# Patient Record
Sex: Male | Born: 1955 | Race: White | Hispanic: No | Smoking: Current every day smoker
Health system: Southern US, Community
[De-identification: ages and names within clinical notes are randomized; demographics above are authoritative.]

## PROBLEM LIST (undated history)

## (undated) DIAGNOSIS — C801 Malignant (primary) neoplasm, unspecified: Secondary | ICD-10-CM

## (undated) DIAGNOSIS — G8929 Other chronic pain: Secondary | ICD-10-CM

## (undated) DIAGNOSIS — F32A Depression, unspecified: Secondary | ICD-10-CM

## (undated) DIAGNOSIS — J449 Chronic obstructive pulmonary disease, unspecified: Secondary | ICD-10-CM

## (undated) DIAGNOSIS — F329 Major depressive disorder, single episode, unspecified: Secondary | ICD-10-CM

## (undated) DIAGNOSIS — R51 Headache: Secondary | ICD-10-CM

## (undated) DIAGNOSIS — M1990 Unspecified osteoarthritis, unspecified site: Secondary | ICD-10-CM

## (undated) DIAGNOSIS — I10 Essential (primary) hypertension: Secondary | ICD-10-CM

## (undated) DIAGNOSIS — C4490 Unspecified malignant neoplasm of skin, unspecified: Secondary | ICD-10-CM

## (undated) HISTORY — PX: SKIN CANCER EXCISION: SHX779

## (undated) HISTORY — PX: OTHER SURGICAL HISTORY: SHX169

## (undated) HISTORY — PX: KNEE ARTHROSCOPY: SUR90

---

## 2004-08-29 ENCOUNTER — Ambulatory Visit: Payer: Self-pay | Admitting: Internal Medicine

## 2005-06-10 ENCOUNTER — Emergency Department: Payer: Self-pay | Admitting: Emergency Medicine

## 2006-11-16 ENCOUNTER — Emergency Department (HOSPITAL_COMMUNITY): Admission: EM | Admit: 2006-11-16 | Discharge: 2006-11-16 | Payer: Self-pay | Admitting: Emergency Medicine

## 2007-05-03 ENCOUNTER — Emergency Department (HOSPITAL_COMMUNITY): Admission: EM | Admit: 2007-05-03 | Discharge: 2007-05-03 | Payer: Self-pay | Admitting: Emergency Medicine

## 2007-05-04 ENCOUNTER — Emergency Department (HOSPITAL_COMMUNITY): Admission: EM | Admit: 2007-05-04 | Discharge: 2007-05-04 | Payer: Self-pay | Admitting: Emergency Medicine

## 2007-05-18 ENCOUNTER — Other Ambulatory Visit: Payer: Self-pay

## 2007-05-18 ENCOUNTER — Emergency Department: Payer: Self-pay | Admitting: Emergency Medicine

## 2007-12-12 ENCOUNTER — Emergency Department (HOSPITAL_COMMUNITY): Admission: EM | Admit: 2007-12-12 | Discharge: 2007-12-12 | Payer: Self-pay | Admitting: Emergency Medicine

## 2008-02-29 ENCOUNTER — Emergency Department (HOSPITAL_COMMUNITY): Admission: EM | Admit: 2008-02-29 | Discharge: 2008-02-29 | Payer: Self-pay | Admitting: Emergency Medicine

## 2008-10-20 ENCOUNTER — Ambulatory Visit: Payer: Self-pay | Admitting: Internal Medicine

## 2008-11-10 ENCOUNTER — Ambulatory Visit: Payer: Self-pay | Admitting: Internal Medicine

## 2008-11-11 ENCOUNTER — Ambulatory Visit: Payer: Self-pay | Admitting: Internal Medicine

## 2008-11-14 ENCOUNTER — Ambulatory Visit: Payer: Self-pay | Admitting: Internal Medicine

## 2008-11-16 ENCOUNTER — Ambulatory Visit: Payer: Self-pay | Admitting: Internal Medicine

## 2008-11-23 ENCOUNTER — Ambulatory Visit: Payer: Self-pay | Admitting: Internal Medicine

## 2008-11-24 ENCOUNTER — Ambulatory Visit: Payer: Self-pay | Admitting: Family Medicine

## 2008-11-25 ENCOUNTER — Emergency Department (HOSPITAL_COMMUNITY): Admission: EM | Admit: 2008-11-25 | Discharge: 2008-11-25 | Payer: Self-pay | Admitting: Emergency Medicine

## 2008-11-28 ENCOUNTER — Ambulatory Visit: Payer: Self-pay | Admitting: Internal Medicine

## 2008-11-29 ENCOUNTER — Ambulatory Visit: Payer: Self-pay | Admitting: Internal Medicine

## 2008-12-01 ENCOUNTER — Ambulatory Visit: Payer: Self-pay | Admitting: Family Medicine

## 2008-12-04 ENCOUNTER — Emergency Department (HOSPITAL_COMMUNITY): Admission: EM | Admit: 2008-12-04 | Discharge: 2008-12-04 | Payer: Self-pay | Admitting: Emergency Medicine

## 2008-12-06 ENCOUNTER — Ambulatory Visit: Payer: Self-pay | Admitting: Internal Medicine

## 2008-12-08 ENCOUNTER — Ambulatory Visit: Payer: Self-pay | Admitting: Internal Medicine

## 2008-12-12 ENCOUNTER — Ambulatory Visit: Payer: Self-pay | Admitting: Internal Medicine

## 2008-12-12 ENCOUNTER — Ambulatory Visit (HOSPITAL_COMMUNITY): Admission: RE | Admit: 2008-12-12 | Discharge: 2008-12-12 | Payer: Self-pay | Admitting: Internal Medicine

## 2008-12-14 ENCOUNTER — Ambulatory Visit: Payer: Self-pay | Admitting: Internal Medicine

## 2008-12-15 ENCOUNTER — Ambulatory Visit: Payer: Self-pay | Admitting: Internal Medicine

## 2008-12-17 ENCOUNTER — Emergency Department (HOSPITAL_COMMUNITY): Admission: EM | Admit: 2008-12-17 | Discharge: 2008-12-17 | Payer: Self-pay | Admitting: Emergency Medicine

## 2008-12-22 ENCOUNTER — Ambulatory Visit: Payer: Self-pay | Admitting: Family Medicine

## 2008-12-28 ENCOUNTER — Ambulatory Visit: Payer: Self-pay | Admitting: Internal Medicine

## 2008-12-31 ENCOUNTER — Emergency Department (HOSPITAL_COMMUNITY): Admission: EM | Admit: 2008-12-31 | Discharge: 2008-12-31 | Payer: Self-pay | Admitting: Emergency Medicine

## 2009-01-12 ENCOUNTER — Ambulatory Visit (HOSPITAL_BASED_OUTPATIENT_CLINIC_OR_DEPARTMENT_OTHER): Admission: RE | Admit: 2009-01-12 | Discharge: 2009-01-13 | Payer: Self-pay | Admitting: Orthopedic Surgery

## 2009-01-17 ENCOUNTER — Ambulatory Visit: Payer: Self-pay | Admitting: Family Medicine

## 2009-01-23 ENCOUNTER — Ambulatory Visit: Payer: Self-pay | Admitting: Internal Medicine

## 2009-01-23 ENCOUNTER — Ambulatory Visit (HOSPITAL_COMMUNITY): Admission: RE | Admit: 2009-01-23 | Discharge: 2009-01-23 | Payer: Self-pay | Admitting: Internal Medicine

## 2009-01-25 ENCOUNTER — Emergency Department (HOSPITAL_COMMUNITY): Admission: EM | Admit: 2009-01-25 | Discharge: 2009-01-25 | Payer: Self-pay | Admitting: Emergency Medicine

## 2009-01-31 ENCOUNTER — Ambulatory Visit: Payer: Self-pay | Admitting: Internal Medicine

## 2009-02-02 ENCOUNTER — Ambulatory Visit: Payer: Self-pay | Admitting: Internal Medicine

## 2009-02-07 ENCOUNTER — Ambulatory Visit (HOSPITAL_COMMUNITY): Admission: RE | Admit: 2009-02-07 | Discharge: 2009-02-07 | Payer: Self-pay | Admitting: Internal Medicine

## 2009-02-07 ENCOUNTER — Ambulatory Visit: Payer: Self-pay | Admitting: Internal Medicine

## 2009-02-16 ENCOUNTER — Ambulatory Visit: Payer: Self-pay | Admitting: Family Medicine

## 2009-02-23 ENCOUNTER — Ambulatory Visit: Payer: Self-pay | Admitting: Family Medicine

## 2009-03-13 ENCOUNTER — Ambulatory Visit: Payer: Self-pay | Admitting: Family Medicine

## 2009-03-27 ENCOUNTER — Telehealth (INDEPENDENT_AMBULATORY_CARE_PROVIDER_SITE_OTHER): Payer: Self-pay | Admitting: *Deleted

## 2009-04-10 ENCOUNTER — Ambulatory Visit: Payer: Self-pay | Admitting: Family Medicine

## 2009-04-12 ENCOUNTER — Ambulatory Visit: Payer: Self-pay | Admitting: Internal Medicine

## 2009-04-20 ENCOUNTER — Ambulatory Visit: Payer: Self-pay | Admitting: Internal Medicine

## 2009-04-20 ENCOUNTER — Emergency Department (HOSPITAL_COMMUNITY): Admission: EM | Admit: 2009-04-20 | Discharge: 2009-04-20 | Payer: Self-pay | Admitting: Emergency Medicine

## 2009-04-21 ENCOUNTER — Telehealth (INDEPENDENT_AMBULATORY_CARE_PROVIDER_SITE_OTHER): Payer: Self-pay | Admitting: *Deleted

## 2009-04-21 ENCOUNTER — Ambulatory Visit: Payer: Self-pay | Admitting: Internal Medicine

## 2009-04-24 ENCOUNTER — Ambulatory Visit: Payer: Self-pay | Admitting: Internal Medicine

## 2009-05-11 ENCOUNTER — Ambulatory Visit: Payer: Self-pay | Admitting: Internal Medicine

## 2009-05-18 ENCOUNTER — Encounter (INDEPENDENT_AMBULATORY_CARE_PROVIDER_SITE_OTHER): Payer: Self-pay | Admitting: *Deleted

## 2009-06-22 ENCOUNTER — Ambulatory Visit: Payer: Self-pay | Admitting: Internal Medicine

## 2009-06-29 ENCOUNTER — Ambulatory Visit: Payer: Self-pay | Admitting: Internal Medicine

## 2009-07-20 ENCOUNTER — Ambulatory Visit: Payer: Self-pay | Admitting: Internal Medicine

## 2009-07-27 ENCOUNTER — Ambulatory Visit: Payer: Self-pay | Admitting: Internal Medicine

## 2009-08-03 ENCOUNTER — Ambulatory Visit: Payer: Self-pay | Admitting: Internal Medicine

## 2009-08-10 ENCOUNTER — Ambulatory Visit: Payer: Self-pay | Admitting: Internal Medicine

## 2009-08-17 ENCOUNTER — Ambulatory Visit: Payer: Self-pay | Admitting: Internal Medicine

## 2009-08-24 ENCOUNTER — Ambulatory Visit: Payer: Self-pay | Admitting: Internal Medicine

## 2009-08-31 ENCOUNTER — Ambulatory Visit: Payer: Self-pay | Admitting: Internal Medicine

## 2009-09-07 ENCOUNTER — Ambulatory Visit: Payer: Self-pay | Admitting: Family Medicine

## 2009-09-15 ENCOUNTER — Ambulatory Visit: Payer: Self-pay | Admitting: Internal Medicine

## 2009-10-02 ENCOUNTER — Ambulatory Visit: Payer: Self-pay | Admitting: Internal Medicine

## 2009-10-13 ENCOUNTER — Ambulatory Visit: Payer: Self-pay | Admitting: Internal Medicine

## 2009-10-27 ENCOUNTER — Ambulatory Visit: Payer: Self-pay | Admitting: Internal Medicine

## 2009-11-03 ENCOUNTER — Ambulatory Visit: Payer: Self-pay | Admitting: Internal Medicine

## 2009-11-04 ENCOUNTER — Emergency Department (HOSPITAL_COMMUNITY): Admission: EM | Admit: 2009-11-04 | Discharge: 2009-11-04 | Payer: Self-pay | Admitting: Emergency Medicine

## 2009-11-16 ENCOUNTER — Emergency Department (HOSPITAL_COMMUNITY): Admission: EM | Admit: 2009-11-16 | Discharge: 2009-11-16 | Payer: Self-pay | Admitting: Emergency Medicine

## 2009-11-24 ENCOUNTER — Ambulatory Visit: Payer: Self-pay | Admitting: Internal Medicine

## 2009-12-21 ENCOUNTER — Ambulatory Visit: Payer: Self-pay | Admitting: Family Medicine

## 2009-12-22 ENCOUNTER — Ambulatory Visit: Payer: Self-pay | Admitting: Internal Medicine

## 2009-12-29 ENCOUNTER — Ambulatory Visit: Payer: Self-pay | Admitting: Internal Medicine

## 2010-01-12 ENCOUNTER — Ambulatory Visit: Payer: Self-pay | Admitting: Internal Medicine

## 2010-01-14 ENCOUNTER — Emergency Department (HOSPITAL_COMMUNITY): Admission: EM | Admit: 2010-01-14 | Discharge: 2010-01-14 | Payer: Self-pay | Admitting: Emergency Medicine

## 2010-02-03 ENCOUNTER — Emergency Department (HOSPITAL_COMMUNITY): Admission: EM | Admit: 2010-02-03 | Discharge: 2010-02-03 | Payer: Self-pay | Admitting: Emergency Medicine

## 2010-02-11 ENCOUNTER — Emergency Department (HOSPITAL_COMMUNITY): Admission: EM | Admit: 2010-02-11 | Discharge: 2010-02-11 | Payer: Self-pay | Admitting: Emergency Medicine

## 2010-03-13 ENCOUNTER — Emergency Department (HOSPITAL_COMMUNITY): Admission: EM | Admit: 2010-03-13 | Discharge: 2010-03-13 | Payer: Self-pay | Admitting: Emergency Medicine

## 2010-03-18 ENCOUNTER — Emergency Department (HOSPITAL_COMMUNITY): Admission: EM | Admit: 2010-03-18 | Discharge: 2010-03-18 | Payer: Self-pay | Admitting: Emergency Medicine

## 2010-03-21 ENCOUNTER — Emergency Department (HOSPITAL_COMMUNITY): Admission: EM | Admit: 2010-03-21 | Discharge: 2010-03-21 | Payer: Self-pay | Admitting: Emergency Medicine

## 2010-04-01 ENCOUNTER — Emergency Department (HOSPITAL_COMMUNITY): Admission: EM | Admit: 2010-04-01 | Discharge: 2010-04-01 | Payer: Self-pay | Admitting: Emergency Medicine

## 2010-05-21 ENCOUNTER — Emergency Department (HOSPITAL_COMMUNITY)
Admission: EM | Admit: 2010-05-21 | Discharge: 2010-05-21 | Payer: Self-pay | Source: Home / Self Care | Admitting: Emergency Medicine

## 2010-06-04 ENCOUNTER — Emergency Department (HOSPITAL_COMMUNITY)
Admission: EM | Admit: 2010-06-04 | Discharge: 2010-06-04 | Payer: Self-pay | Source: Home / Self Care | Admitting: Emergency Medicine

## 2010-06-19 ENCOUNTER — Emergency Department (HOSPITAL_COMMUNITY)
Admission: EM | Admit: 2010-06-19 | Discharge: 2010-06-19 | Payer: Self-pay | Source: Home / Self Care | Admitting: Emergency Medicine

## 2010-07-18 DIAGNOSIS — C44320 Squamous cell carcinoma of skin of unspecified parts of face: Secondary | ICD-10-CM | POA: Insufficient documentation

## 2010-07-24 ENCOUNTER — Emergency Department (HOSPITAL_COMMUNITY)
Admission: EM | Admit: 2010-07-24 | Discharge: 2010-07-25 | Disposition: A | Discharge status: Home / Self Care | Payer: Medicaid Other | Attending: Emergency Medicine | Admitting: Emergency Medicine

## 2010-07-24 DIAGNOSIS — I10 Essential (primary) hypertension: Secondary | ICD-10-CM | POA: Insufficient documentation

## 2010-07-24 DIAGNOSIS — Z85828 Personal history of other malignant neoplasm of skin: Secondary | ICD-10-CM | POA: Insufficient documentation

## 2010-07-24 DIAGNOSIS — Z09 Encounter for follow-up examination after completed treatment for conditions other than malignant neoplasm: Secondary | ICD-10-CM | POA: Insufficient documentation

## 2010-08-13 LAB — URINALYSIS, ROUTINE W REFLEX MICROSCOPIC
Hgb urine dipstick: NEGATIVE
Ketones, ur: NEGATIVE mg/dL

## 2010-09-08 LAB — BASIC METABOLIC PANEL
BUN: 10 mg/dL (ref 6–23)
CO2: 27 mEq/L (ref 19–32)
Calcium: 9.7 mg/dL (ref 8.4–10.5)
Chloride: 102 mEq/L (ref 96–112)
Creatinine, Ser: 0.95 mg/dL (ref 0.4–1.5)
GFR calc Af Amer: >60 mL/min (ref >60)
GFR calc non Af Amer: >60 mL/min (ref >60)
Glucose, Bld: 180 mg/dL — ABNORMAL HIGH (ref 70–99)
Potassium: 3.6 mEq/L (ref 3.5–5.1)
Sodium: 138 mEq/L (ref 135–145)

## 2010-09-08 LAB — POCT I-STAT, CHEM 8
BUN: 12 mg/dL (ref 6–23)
Calcium, Ion: 1.16 mmol/L (ref 1.12–1.32)
Chloride: 106 mEq/L (ref 96–112)
Creatinine, Ser: 1 mg/dL (ref 0.4–1.5)
Glucose, Bld: 99 mg/dL (ref 70–99)
HCT: 46 % (ref 39.0–52.0)
Hemoglobin: 15.6 g/dL (ref 13.0–17.0)
Potassium: 3.9 mEq/L (ref 3.5–5.1)
Sodium: 140 mEq/L (ref 135–145)
TCO2: 24 mmol/L (ref 0–100)

## 2010-09-08 LAB — POCT HEMOGLOBIN-HEMACUE: Hemoglobin: 16.7 g/dL (ref 13.0–17.0)

## 2010-09-10 LAB — POCT I-STAT, CHEM 8
BUN: 8 mg/dL (ref 6–23)
Calcium, Ion: 1.15 mmol/L (ref 1.12–1.32)
Chloride: 106 mEq/L (ref 96–112)
Creatinine, Ser: 1 mg/dL (ref 0.4–1.5)
Glucose, Bld: 102 mg/dL — ABNORMAL HIGH (ref 70–99)
HCT: 45 % (ref 39.0–52.0)
Hemoglobin: 15.3 g/dL (ref 13.0–17.0)
Potassium: 4.6 mEq/L (ref 3.5–5.1)
Sodium: 138 mEq/L (ref 135–145)
TCO2: 25 mmol/L (ref 0–100)

## 2010-10-16 NOTE — Op Note (Signed)
NAMEHUMPHREY, GUERREIRO            ACCOUNT NO.:  [id number]   MEDICAL RECORD NO.:  [id number]          PATIENT TYPE:  AMB   LOCATION:  DSC                          FACILITY:  [facility]   PHYSICIAN:  Loreta Ave, M.D. DATE OF BIRTH:  [birth year]   DATE OF PROCEDURE:  01/12/2009  DATE OF DISCHARGE:                               OPERATIVE REPORT   PREOPERATIVE DIAGNOSIS:  Displaced three-part intra-articular distal  radius fracture, left.   POSTOPERATIVE DIAGNOSIS:  Displaced three-part intra-articular distal  radius fracture, left.   PROCEDURE:  Open reduction and internal fixation, left distal radius  intra-articular fracture with a volar Synthes locking plate.  Four  distal locking screws, 3 proximal shaft nonlocking screws.   SURGEON:  Loreta Ave, MD   ASSISTANT:  Genene Churn. Barry Dienes, PA   ANESTHESIA:  General.   BLOOD LOSS:  Minimal.   TOURNIQUET TIME:  45 minutes.   SPECIMENS:  None.   CULTURES:  None.   COMPLICATIONS:  None.   DRESSING:  Soft compressive with a bulky dressing and splint.   PROCEDURE:  The patient was brought to the operating room and placed on  the operating table in the supine position.  After adequate anesthesia  had been obtained, splint removed.  Not much swelling.  Abrasion  proximal aspect of forearm kept the operative field.  No evidence of  infection.  Tourniquet applied.  Prepped and draped in usual sterile  fashion.  Exsanguinated with elevation and Esmarch, tourniquet inflated  to 250 mmHg.  Longitudinal incision adjacent to the flexor carpi  radialis tendon.  Skin and subcutaneous tissue divided.  Neurovascular  structures protected and retracted.  The fracture exposed.  With  fluoroscopic guidance, reduced anatomically.  A split of the distal  piece, but this had a congruent joint surface after closed reduction and  manipulation.  Pleased with alignment.  Plate was placed volarly and  confirmed good position fluoroscopically.  With  predrilling, distal  fixation with 4 locking screws and proximal fixation with 3 predrilled  cortical screws.  At completion, anatomic alignment, good fixation  confirmed throughout visually and fluoroscopically.  The pronator  brought over top of the plate back into normal position.  Wound was  irrigated.  Closed with Vicryl and staples.  Margins were injected with  Marcaine.  Sterile compressive dressing applied.  A Xeroform was then placed over  the proximal forearm abrasion, and a bulky hand dressing and splint  applied.  Tourniquet deflated and removed.  Anesthesia reversed.  Brought to recovery room.  Tolerated surgery well.  No complications.      Loreta Ave, M.D.  Electronically Signed     Loreta Ave, M.D.  Electronically Signed    DFM/MEDQ  D:  01/12/2009  T:  01/12/2009  Job:  [id number]

## 2010-12-20 ENCOUNTER — Emergency Department (HOSPITAL_COMMUNITY)
Admission: EM | Admit: 2010-12-20 | Discharge: 2010-12-21 | Disposition: A | Discharge status: Home / Self Care | Payer: Medicaid Other | Attending: Emergency Medicine | Admitting: Emergency Medicine

## 2010-12-20 DIAGNOSIS — J329 Chronic sinusitis, unspecified: Secondary | ICD-10-CM | POA: Insufficient documentation

## 2010-12-20 DIAGNOSIS — I10 Essential (primary) hypertension: Secondary | ICD-10-CM | POA: Insufficient documentation

## 2010-12-20 DIAGNOSIS — R300 Dysuria: Secondary | ICD-10-CM | POA: Insufficient documentation

## 2010-12-20 DIAGNOSIS — R51 Headache: Secondary | ICD-10-CM | POA: Insufficient documentation

## 2010-12-21 ENCOUNTER — Emergency Department (HOSPITAL_COMMUNITY): Payer: Medicaid Other

## 2010-12-21 LAB — URINALYSIS, ROUTINE W REFLEX MICROSCOPIC: Protein, ur: NEGATIVE mg/dL

## 2010-12-22 LAB — URINE CULTURE
Colony Count: 4000
Culture  Setup Time: 201207200448

## 2011-01-24 ENCOUNTER — Encounter: Payer: Medicaid Other | Admitting: Oncology

## 2011-02-08 ENCOUNTER — Emergency Department (HOSPITAL_COMMUNITY)
Admission: EM | Admit: 2011-02-08 | Discharge: 2011-02-08 | Disposition: A | Discharge status: Home / Self Care | Payer: Medicaid Other | Attending: Emergency Medicine | Admitting: Emergency Medicine

## 2011-02-08 DIAGNOSIS — L989 Disorder of the skin and subcutaneous tissue, unspecified: Secondary | ICD-10-CM | POA: Insufficient documentation

## 2011-02-08 DIAGNOSIS — I10 Essential (primary) hypertension: Secondary | ICD-10-CM | POA: Insufficient documentation

## 2011-02-08 DIAGNOSIS — X58XXXA Exposure to other specified factors, initial encounter: Secondary | ICD-10-CM | POA: Insufficient documentation

## 2011-02-08 DIAGNOSIS — G8929 Other chronic pain: Secondary | ICD-10-CM | POA: Insufficient documentation

## 2011-02-08 DIAGNOSIS — IMO0002 Reserved for concepts with insufficient information to code with codable children: Secondary | ICD-10-CM | POA: Insufficient documentation

## 2011-02-08 DIAGNOSIS — M25569 Pain in unspecified knee: Secondary | ICD-10-CM | POA: Insufficient documentation

## 2011-02-08 DIAGNOSIS — R51 Headache: Secondary | ICD-10-CM | POA: Insufficient documentation

## 2011-02-08 DIAGNOSIS — H9210 Otorrhea, unspecified ear: Secondary | ICD-10-CM | POA: Insufficient documentation

## 2011-02-08 LAB — CBC
HCT: 42.6 % (ref 39.0–52.0)
Hemoglobin: 15 g/dL (ref 13.0–17.0)
MCH: 31.5 pg (ref 26.0–34.0)
MCHC: 35.2 g/dL (ref 30.0–36.0)
MCV: 89.5 fL (ref 78.0–100.0)
WBC: 6.1 10*3/uL (ref 4.0–10.5)

## 2011-02-08 LAB — PROTIME-INR: Prothrombin Time: 13.4 seconds (ref 11.6–15.2)

## 2011-03-11 ENCOUNTER — Emergency Department (HOSPITAL_COMMUNITY)
Admission: EM | Admit: 2011-03-11 | Discharge: 2011-03-11 | Discharge status: Against Medical Advice | Payer: Medicaid Other | Attending: Emergency Medicine | Admitting: Emergency Medicine

## 2011-03-11 DIAGNOSIS — M545 Low back pain, unspecified: Secondary | ICD-10-CM | POA: Insufficient documentation

## 2011-03-11 DIAGNOSIS — M542 Cervicalgia: Secondary | ICD-10-CM | POA: Insufficient documentation

## 2011-03-11 DIAGNOSIS — R079 Chest pain, unspecified: Secondary | ICD-10-CM | POA: Insufficient documentation

## 2011-03-11 DIAGNOSIS — M25569 Pain in unspecified knee: Secondary | ICD-10-CM | POA: Insufficient documentation

## 2011-03-15 DIAGNOSIS — L570 Actinic keratosis: Secondary | ICD-10-CM | POA: Insufficient documentation

## 2011-03-15 DIAGNOSIS — D099 Carcinoma in situ, unspecified: Secondary | ICD-10-CM | POA: Insufficient documentation

## 2011-03-15 DIAGNOSIS — Z85828 Personal history of other malignant neoplasm of skin: Secondary | ICD-10-CM | POA: Insufficient documentation

## 2011-03-20 LAB — I-STAT 8, (EC8 V) (CONVERTED LAB)
Acid-Base Excess: 2
BUN: 6
Bicarbonate: 30.1 — ABNORMAL HIGH
Chloride: 106
Glucose, Bld: 75
HCT: 51
Hemoglobin: 17.3 — ABNORMAL HIGH
Operator id: 151321
Potassium: 4.6
Sodium: 140
TCO2: 32
pCO2, Ven: 61.3 — ABNORMAL HIGH
pH, Ven: 7.299

## 2011-03-20 LAB — ETHANOL: Alcohol, Ethyl (B): <5

## 2011-03-20 LAB — RAPID URINE DRUG SCREEN, HOSP PERFORMED
Amphetamines: NOT DETECTED
Barbiturates: NOT DETECTED
Benzodiazepines: NOT DETECTED
Cocaine: POSITIVE — AB
Opiates: NOT DETECTED
Tetrahydrocannabinol: NOT DETECTED

## 2011-05-03 DIAGNOSIS — C44621 Squamous cell carcinoma of skin of unspecified upper limb, including shoulder: Secondary | ICD-10-CM | POA: Insufficient documentation

## 2011-05-06 ENCOUNTER — Other Ambulatory Visit: Payer: Self-pay

## 2011-05-06 ENCOUNTER — Encounter: Payer: Self-pay | Admitting: *Deleted

## 2011-05-06 ENCOUNTER — Emergency Department (HOSPITAL_BASED_OUTPATIENT_CLINIC_OR_DEPARTMENT_OTHER)
Admission: EM | Admit: 2011-05-06 | Discharge: 2011-05-06 | Disposition: A | Discharge status: Home / Self Care | Payer: Medicaid Other | Attending: Emergency Medicine | Admitting: Emergency Medicine

## 2011-05-06 ENCOUNTER — Emergency Department (INDEPENDENT_AMBULATORY_CARE_PROVIDER_SITE_OTHER): Payer: Medicaid Other

## 2011-05-06 DIAGNOSIS — R51 Headache: Secondary | ICD-10-CM | POA: Insufficient documentation

## 2011-05-06 DIAGNOSIS — I10 Essential (primary) hypertension: Secondary | ICD-10-CM | POA: Insufficient documentation

## 2011-05-06 DIAGNOSIS — Z79899 Other long term (current) drug therapy: Secondary | ICD-10-CM | POA: Insufficient documentation

## 2011-05-06 HISTORY — DX: Essential (primary) hypertension: I10

## 2011-05-06 HISTORY — DX: Major depressive disorder, single episode, unspecified: F32.9

## 2011-05-06 HISTORY — DX: Depression, unspecified: F32.A

## 2011-05-06 LAB — CARDIAC PANEL(CRET KIN+CKTOT+MB+TROPI)
Relative Index: 3.2 — ABNORMAL HIGH (ref 0.0–2.5)
Total CK: 176 U/L (ref 7–232)
Troponin I: <0.3 ng/mL (ref <0.30)
Troponin I: <0.3 ng/mL (ref <0.30)

## 2011-05-06 LAB — BASIC METABOLIC PANEL
Chloride: 105 mEq/L (ref 96–112)
GFR calc Af Amer: >90 mL/min (ref >90)
Potassium: 4.3 mEq/L (ref 3.5–5.1)

## 2011-05-06 LAB — URINALYSIS, ROUTINE W REFLEX MICROSCOPIC
Bilirubin Urine: NEGATIVE
Ketones, ur: NEGATIVE mg/dL
Nitrite: NEGATIVE
pH: 6.5 (ref 5.0–8.0)

## 2011-05-06 MED ORDER — HYDROCHLOROTHIAZIDE 25 MG PO TABS
25.0000 mg | ORAL_TABLET | Freq: Once | ORAL | Status: AC
  Administered 2011-05-06: 25 mg via ORAL
  Filled 2011-05-06: qty 1

## 2011-05-06 MED ORDER — LABETALOL HCL 5 MG/ML IV SOLN
20.0000 mg | Freq: Once | INTRAVENOUS | Status: AC
  Administered 2011-05-06: 20 mg via INTRAVENOUS
  Filled 2011-05-06: qty 4

## 2011-05-06 MED ORDER — ACETAMINOPHEN 325 MG PO TABS
650.0000 mg | ORAL_TABLET | Freq: Once | ORAL | Status: AC
  Administered 2011-05-06: 650 mg via ORAL
  Filled 2011-05-06: qty 2

## 2011-05-06 MED ORDER — LABETALOL HCL 200 MG PO TABS
200.0000 mg | ORAL_TABLET | Freq: Once | ORAL | Status: DC
  Filled 2011-05-06: qty 1

## 2011-05-06 NOTE — ED Provider Notes (Signed)
History  This chart was scribed for Glynn Octave, MD by Bennett Scrape. This patient was seen in room [facility] and the patient's care was started at 4:00PM  CSN: [id number] Arrival date & time: 05/06/2011  2:45 PM   First MD Initiated Contact with Patient 05/06/11 1544      Chief Complaint  Patient presents with  . Hypertension  . Headache    The history is provided by the patient. No language interpreter was used.   Don [name] is a 55 y.o. male brought in by ambulance, who presents to the Emergency Department complaining of 2 days of high BP and one day of gradual onset, gradually worsening non-radiating headache located in the left frontal region with associated dizziness described as not spinning. Pt states that he measured his BP yesterday at 165/20 and that EMS measured his BP today at 160/20. Pt described the symptoms as being similar to the headaches with high BP he has experienced in the past.  Pt states that he takes his BP medication regularly. Pt denies photophobia, visual disturbances, problems walking, chest pain, SOB, nausea, vomiting and diarrhea as associated symptoms. Pt reports that had skin cancer removal from the left-side of his face and right hand 3 days ago.  Pt reports that he has a h/o of similar episodes with hospitalization. Pt reports that his BP usually runs high around 140/90. He states that he has an appointment to see his PCP this week    Pt's PCP is Dr. Shanon Rosser.  Past Medical History  Diagnosis Date  . Hypertension   . Depression     History reviewed. No pertinent past surgical history.  No family history on file.  History  Substance Use Topics  . Smoking status: Not on file  . Smokeless tobacco: Not on file  . Alcohol Use:       Review of Systems A complete 10 system review of systems was obtained and is otherwise negative except as noted in the HPI.   Allergies  Review of patient's allergies indicates no known allergies.  Home  Medications   Current Outpatient Rx  Name Route Sig Dispense Refill  . ACETAMINOPHEN 500 MG PO TABS Oral Take 1,000 mg by mouth every 6 (six) hours as needed. For pain     . ARIPIPRAZOLE 5 MG PO TABS Oral Take 5 mg by mouth at bedtime.      . BUPROPION HCL ER (SR) 150 MG PO TB12 Oral Take 150 mg by mouth 2 (two) times daily.      Marland Kitchen CITALOPRAM HYDROBROMIDE 40 MG PO TABS Oral Take 40 mg by mouth daily.      Marland Kitchen CLONAZEPAM 1 MG PO TABS Oral Take 1 mg by mouth 4 (four) times daily.      Marland Kitchen DICLOFENAC SODIUM 75 MG PO TBEC Oral Take 75 mg by mouth 2 (two) times daily.      Marland Kitchen DILTIAZEM HCL ER COATED BEADS 120 MG PO CP24 Oral Take 120 mg by mouth daily.      . TRAMADOL HCL 50 MG PO TABS Oral Take 50 mg by mouth 4 (four) times daily as needed. For pain. Maximum dose= 8 tablets per day       Triage Vitals: BP 142/88  Pulse 81  Temp(Src) 97.9 F (36.6 C) (Oral)  Resp 20  SpO2 99%  Physical Exam  Nursing note and vitals reviewed. Constitutional: He is oriented to person, place, and time. He appears well-developed and well-nourished.  HENT:  Head: Normocephalic and atraumatic.  Eyes: Conjunctivae and EOM are normal. Pupils are equal, round, and reactive to light.  Neck: Normal range of motion. Neck supple.  Cardiovascular: Normal rate, regular rhythm and normal heart sounds.   Pulmonary/Chest: Effort normal and breath sounds normal.  Abdominal: Soft. Bowel sounds are normal. There is no tenderness.  Musculoskeletal: Normal range of motion. He exhibits no edema.       Intelligent gait secondary to chronic knee issues  Neurological: He is alert and oriented to person, place, and time.       No axtaxia  Skin: Skin is warm and dry.    ED Course  Procedures (including critical care time)  DIAGNOSTIC STUDIES: Oxygen Saturation is 99% on room air, normal by my interpretation.    COORDINATION OF CARE: 4:05PM-Discussed treatment plan with pt at bedside and pt agreed to plan. 6:00PM-Pt rechecked  and states that he is still feeling bad. Discussed updated treatment plan with pt and pt agreed to plan. 7:27PM-Pt states that he is feeling better and comfortable being discharged home. Pt states that he will call PCP tomorrow to set up appointment to discuss BP medication changes.   Labs Reviewed  BASIC METABOLIC PANEL - Abnormal; Notable for the following:    Glucose, Bld 102 (*)    All other components within normal limits  CARDIAC PANEL(CRET KIN+CKTOT+MB+TROPI) - Abnormal; Notable for the following:    CK, MB 5.6 (*)    Relative Index 3.2 (*)    All other components within normal limits  CARDIAC PANEL(CRET KIN+CKTOT+MB+TROPI) - Abnormal; Notable for the following:    CK, MB 5.0 (*)    Relative Index 3.1 (*)    All other components within normal limits  URINALYSIS, ROUTINE W REFLEX MICROSCOPIC   Ct Head Wo Contrast  05/06/2011  *RADIOLOGY REPORT*  Clinical Data:  Headache.  Hypertension  CT HEAD WITHOUT CONTRAST  Technique:  Contiguous axial images were obtained from the base of the skull through the vertex without contrast  Comparison:  12/21/2010  Findings:  The brain has a normal appearance without evidence for hemorrhage, acute infarction, hydrocephalus, or mass lesion.  There is no extra axial fluid collection.  The skull and paranasal sinuses are normal.  IMPRESSION: Normal CT of the head without contrast.  Original Report Authenticated By: Camelia Phenes, M.D.     1. Hypertension       MDM  History of hypertension with gradual onset headache similar to previous. No chest pain, SOB, dizziness.  No visual changes. Elevated BP here with dull headache.      Date: 05/06/2011  Rate: 67  Rhythm: normal sinus rhythm  QRS Axis: normal  Intervals: normal  ST/T Wave abnormalities: normal  Conduction Disutrbances:right bundle branch block  Narrative Interpretation:   Old EKG Reviewed: unchanged  Blood pressure has improved the headache has resolved. Patient denies any chest  pain or shortness of breath. He sent stable EKG and 2 negative sets of cardiac enzymes. He says he is up with Dr. Ricki Miller later this week I advised him to have hypertension readdressed that time.  I personally performed the services described in this documentation, which was scribed in my presence.  The recorded information has been reviewed and considered.        Glynn Octave, MD 05/07/11 [phone]

## 2011-05-06 NOTE — ED Notes (Signed)
Pt drinking cranberry juice tolerating well visitor at the bedside

## 2011-05-06 NOTE — ED Notes (Signed)
Pt feeling better states is hungry MD states ok to feed pt  Explained to patient that we are waiting for a second set of cardiac markers then if they are normal will be able to go home.

## 2011-05-06 NOTE — ED Notes (Signed)
Several days of headache and thinks his blood pressue meds arent working

## 2011-06-07 ENCOUNTER — Encounter (HOSPITAL_COMMUNITY): Payer: Self-pay | Admitting: *Deleted

## 2011-06-07 ENCOUNTER — Emergency Department (HOSPITAL_COMMUNITY)
Admission: EM | Admit: 2011-06-07 | Discharge: 2011-06-08 | Disposition: A | Discharge status: Home / Self Care | Payer: Medicaid Other | Attending: Emergency Medicine | Admitting: Emergency Medicine

## 2011-06-07 DIAGNOSIS — J329 Chronic sinusitis, unspecified: Secondary | ICD-10-CM | POA: Insufficient documentation

## 2011-06-07 DIAGNOSIS — Z79899 Other long term (current) drug therapy: Secondary | ICD-10-CM | POA: Insufficient documentation

## 2011-06-07 DIAGNOSIS — R51 Headache: Secondary | ICD-10-CM | POA: Insufficient documentation

## 2011-06-07 DIAGNOSIS — I10 Essential (primary) hypertension: Secondary | ICD-10-CM | POA: Insufficient documentation

## 2011-06-07 NOTE — ED Notes (Signed)
Acuity increased based on age and PMH

## 2011-06-08 MED ORDER — AMOXICILLIN 500 MG PO CAPS: 500.0000 mg | ORAL_CAPSULE | Freq: Three times a day (TID) | ORAL | Status: AC

## 2011-06-08 MED ORDER — HYDROCODONE-ACETAMINOPHEN 5-325 MG PO TABS
1.0000 | ORAL_TABLET | Freq: Once | ORAL | Status: AC
  Administered 2011-06-08: 1 via ORAL
  Filled 2011-06-08: qty 1

## 2011-06-08 MED ORDER — SALINE NASAL SPRAY 0.65 % NA SOLN: 1.0000 | NASAL | Status: DC | PRN

## 2011-06-08 MED ORDER — HYDROCODONE-ACETAMINOPHEN 5-325 MG PO TABS: 1.0000 | ORAL_TABLET | Freq: Four times a day (QID) | ORAL | Status: AC | PRN

## 2011-06-08 NOTE — ED Provider Notes (Signed)
History     CSN: [id number]  Arrival date & time 06/07/11  1836   First MD Initiated Contact with Patient 06/07/11 2316      Chief Complaint  Patient presents with  . Headache    pt c/o sinus headache that began 2 months ago. reports unable to bear pain anymore. pt denies n/v.     (Consider location/radiation/quality/duration/timing/severity/associated sxs/prior treatment) HPI Comments: Pt states having sinus pressure and HA x 2 mo that has cont to worsen, Denies F, NS, chills, change in vision, N/V, dizziness, ear pain. Pt has a hx of sinus infection and states that this is similar.   Patient is a 56 y.o. male presenting with headaches. The history is provided by the patient.  Headache  The current episode started more than 1 week ago. The problem occurs constantly. The problem has been gradually worsening. The pain is at a severity of 8/10. The pain is moderate. The pain does not radiate. Pertinent negatives include no anorexia, no fever, no malaise/fatigue, no chest pressure, no near-syncope, no orthopnea, no palpitations, no syncope, no shortness of breath, no nausea and no vomiting.    Past Medical History  Diagnosis Date  . Hypertension   . Depression     History reviewed. No pertinent past surgical history.  History reviewed. No pertinent family history.  History  Substance Use Topics  . Smoking status: Current Everyday Smoker    Types: Cigarettes  . Smokeless tobacco: Not on file  . Alcohol Use: No      Review of Systems  Constitutional: Negative for fever and malaise/fatigue.  HENT: Positive for congestion and sinus pressure. Negative for ear pain, sore throat, rhinorrhea, sneezing, mouth sores, trouble swallowing, neck pain, neck stiffness and ear discharge.   Eyes: Negative for photophobia, pain, discharge and itching.  Respiratory: Negative for shortness of breath and stridor.   Cardiovascular: Negative for chest pain, palpitations, orthopnea, syncope and  near-syncope.  Gastrointestinal: Negative for nausea, vomiting and anorexia.  Neurological: Positive for headaches. Negative for dizziness, tremors, seizures, syncope, facial asymmetry, speech difficulty, weakness, light-headedness and numbness.  Psychiatric/Behavioral: Negative for confusion.  All other systems reviewed and are negative.    Allergies  Review of patient's allergies indicates no known allergies.  Home Medications   Current Outpatient Rx  Name Route Sig Dispense Refill  . ACETAMINOPHEN 500 MG PO TABS Oral Take 1,000 mg by mouth every 6 (six) hours as needed. For pain     . ARIPIPRAZOLE 5 MG PO TABS Oral Take 5 mg by mouth at bedtime.      . BUPROPION HCL ER (SR) 150 MG PO TB12 Oral Take 150 mg by mouth 2 (two) times daily.      Marland Kitchen CITALOPRAM HYDROBROMIDE 40 MG PO TABS Oral Take 40 mg by mouth daily.      Marland Kitchen CLONAZEPAM 1 MG PO TABS Oral Take 1 mg by mouth 4 (four) times daily.      Marland Kitchen DICLOFENAC SODIUM 75 MG PO TBEC Oral Take 75 mg by mouth 2 (two) times daily.      Marland Kitchen DILTIAZEM HCL ER COATED BEADS 120 MG PO CP24 Oral Take 120 mg by mouth daily.      . IBUPROFEN 200 MG PO TABS Oral Take 200 mg by mouth every 6 (six) hours as needed.      Marland Kitchen TRAMADOL HCL 50 MG PO TABS Oral Take 50 mg by mouth 4 (four) times daily as needed. For pain. Maximum dose= 8 tablets  per day       BP 127/89  Pulse 76  Temp(Src) 98.2 F (36.8 C) (Oral)  Resp 20  SpO2 99%  Physical Exam  Nursing note and vitals reviewed. Constitutional: He is oriented to person, place, and time. He appears well-developed and well-nourished. No distress.  HENT:  Head: No trismus in the jaw.  Right Ear: Tympanic membrane, external ear and ear canal normal.  Left Ear: Tympanic membrane, external ear and ear canal normal.  Nose: No rhinorrhea.  Mouth/Throat: Uvula is midline and mucous membranes are normal. No oropharyngeal exudate, posterior oropharyngeal edema or posterior oropharyngeal erythema.       Sinus  pressure of frontal sinus bilaterally   Eyes: Conjunctivae and EOM are normal. Pupils are equal, round, and reactive to light.       Normal appearance  Neck: Normal range of motion. Neck supple.  Cardiovascular: Normal rate and regular rhythm.   Pulmonary/Chest: Effort normal and breath sounds normal.  Lymphadenopathy:    He has no cervical adenopathy.  Neurological: He is alert and oriented to person, place, and time. No cranial nerve deficit. Coordination and gait normal.  Skin: Skin is warm and dry. No rash noted.  Psychiatric: He has a normal mood and affect. His behavior is normal.    ED Course  Procedures (including critical care time)  Labs Reviewed - No data to display No results found.   No diagnosis found.  Pt being tx w abx and pain mngt for sinusitis. Has PCP to follow up with and will if symptoms persist. Not concerning for acute emergent head issues bc denies neurological deficits and CN intact.   MDM  Sinusitis         [place], [place] 06/08/11 [phone]

## 2011-06-08 NOTE — ED Provider Notes (Signed)
Medical screening examination/treatment/procedure(s) were performed by non-physician practitioner and as supervising physician I was immediately available for consultation/collaboration.  Donnetta Hutching, MD 06/08/11 [phone]

## 2011-06-13 ENCOUNTER — Emergency Department (HOSPITAL_COMMUNITY)
Admission: EM | Admit: 2011-06-13 | Discharge: 2011-06-13 | Disposition: A | Discharge status: Home / Self Care | Payer: Medicaid Other | Attending: Emergency Medicine | Admitting: Emergency Medicine

## 2011-06-13 ENCOUNTER — Encounter (HOSPITAL_COMMUNITY): Payer: Self-pay | Admitting: Emergency Medicine

## 2011-06-13 DIAGNOSIS — F329 Major depressive disorder, single episode, unspecified: Secondary | ICD-10-CM

## 2011-06-13 DIAGNOSIS — M25569 Pain in unspecified knee: Secondary | ICD-10-CM | POA: Insufficient documentation

## 2011-06-13 DIAGNOSIS — G8929 Other chronic pain: Secondary | ICD-10-CM | POA: Insufficient documentation

## 2011-06-13 DIAGNOSIS — I10 Essential (primary) hypertension: Secondary | ICD-10-CM | POA: Insufficient documentation

## 2011-06-13 DIAGNOSIS — F3289 Other specified depressive episodes: Secondary | ICD-10-CM | POA: Insufficient documentation

## 2011-06-13 DIAGNOSIS — R51 Headache: Secondary | ICD-10-CM | POA: Insufficient documentation

## 2011-06-13 HISTORY — DX: Malignant (primary) neoplasm, unspecified: C80.1

## 2011-06-13 MED ORDER — HYDROCODONE-ACETAMINOPHEN 5-325 MG PO TABS
1.0000 | ORAL_TABLET | Freq: Once | ORAL | Status: AC
  Administered 2011-06-13: 1 via ORAL
  Filled 2011-06-13: qty 1

## 2011-06-13 MED ORDER — HYDROCODONE-ACETAMINOPHEN 5-325 MG PO TABS: 1.0000 | ORAL_TABLET | Freq: Four times a day (QID) | ORAL | Status: AC | PRN

## 2011-06-13 MED ORDER — PSEUDOEPHEDRINE HCL ER 120 MG PO TB12: 120.0000 mg | ORAL_TABLET | Freq: Two times a day (BID) | ORAL | Status: DC

## 2011-06-13 NOTE — ED Provider Notes (Signed)
History     CSN: [id number]  Arrival date & time 06/13/11  1722   First MD Initiated Contact with Patient 06/13/11 1847      Chief Complaint  Patient presents with  . Headache  . Knee Pain    (Consider location/radiation/quality/duration/timing/severity/associated sxs/prior treatment) HPI Comments: Patient reports he has had a headache x 2 months.  Pain is in her bilateral forehead and he thought was related to his chronic sinus problems.  Pt was seen in the ED last month with a normal head CT and was seen last week and given amoxicillin for sinusitis.  Pt states the amoxicillin is not helping.  States he continues to have pain in his forehead and right sided nasal drainage. Saw his PCP yesterday at [facility] and was kept on amoxicillin.  Ran out of his vicodin yesterday.  States he cannot sleep at night because of the pain.  The only time pain has been relieved was when he was on vacation a few weeks ago.  States he has been very depressed and has had some family problems recently - states he has a Paramedic at [facility] that he can call at any time and that he will follow up with.  Denies any thoughts or intent of suicide.  Denies fevers, visual changes, nasal congestions, focal neurological deficits, any significant change in the location or quality of his headache since it began over 2 months ago.    Patient is a 56 y.o. male presenting with headaches and knee pain. The history is provided by the patient.  Headache  Pertinent negatives include no fever, no shortness of breath and no vomiting.  Knee Pain Associated symptoms include headaches. Pertinent negatives include no abdominal pain, fever or vomiting.    Past Medical History  Diagnosis Date  . Hypertension   . Depression   . 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 No pertinent past surgical history.  History reviewed. No pertinent family history.  History  Substance Use Topics  .  Smoking status: Current Everyday Smoker    Types: Cigarettes  . Smokeless tobacco: Not on file  . Alcohol Use: No      Review of Systems  Constitutional: Negative for fever.  HENT: Negative for neck stiffness.   Respiratory: Negative for chest tightness and shortness of breath.   Gastrointestinal: Negative for vomiting, abdominal pain and diarrhea.  Musculoskeletal:       Chronic bilateral knee pain, unchanged  Neurological: Positive for headaches.  Psychiatric/Behavioral: Negative for suicidal ideas and self-injury.  All other systems reviewed and are negative.    Allergies  Review of patient's allergies indicates no known allergies.  Home Medications   Current Outpatient Rx  Name Route Sig Dispense Refill  . ACETAMINOPHEN 500 MG PO TABS Oral Take 1,000 mg by mouth every 6 (six) hours as needed. For pain    . AMOXICILLIN 500 MG PO CAPS Oral Take 1 capsule (500 mg total) by mouth 3 (three) times daily. 21 capsule 0  . ARIPIPRAZOLE 5 MG PO TABS Oral Take 5 mg by mouth daily.     . BUPROPION HCL ER (SR) 150 MG PO TB12 Oral Take 150 mg by mouth 2 (two) times daily.      Marland Kitchen CALCIUM CARBONATE 600 MG PO TABS Oral Take 600 mg by mouth daily.    Marland Kitchen CITALOPRAM HYDROBROMIDE 40 MG PO TABS Oral Take 40 mg by mouth daily.      Marland Kitchen  HYDROCODONE-ACETAMINOPHEN 5-325 MG PO TABS Oral Take 1 tablet by mouth every 6 (six) hours as needed for pain. 15 tablet 0  . SALINE NASAL SPRAY 0.65 % NA SOLN Nasal Place 1 spray into the nose as needed for congestion. 30 mL 12  . TRAMADOL HCL 50 MG PO TABS Oral Take 50 mg by mouth 4 (four) times daily as needed. For pain. Maximum dose= 8 tablets per day       BP 166/88  Pulse 79  Temp(Src) 98.8 F (37.1 C) (Oral)  Resp 18  SpO2 100%  Physical Exam  Nursing note and vitals reviewed. Constitutional: He is oriented to person, place, and time. He appears well-developed and well-nourished. He is active.  Non-toxic appearance. No distress.  HENT:  Head:  Normocephalic and atraumatic.  Nose: Mucosal edema present. Right sinus exhibits no maxillary sinus tenderness and no frontal sinus tenderness. Left sinus exhibits no maxillary sinus tenderness and no frontal sinus tenderness.  Mouth/Throat: Uvula is midline and oropharynx is clear and moist.  Neck: Normal range of motion. Neck supple. Muscular tenderness present. No rigidity.  Cardiovascular: Normal rate and regular rhythm.   Pulmonary/Chest: Effort normal and breath sounds normal. No respiratory distress. He has no wheezes. He has no rales. He exhibits no tenderness.  Musculoskeletal: Normal range of motion. He exhibits no edema and no tenderness.  Neurological: He is alert and oriented to person, place, and time. He has normal strength. No cranial nerve deficit or sensory deficit. He exhibits normal muscle tone. GCS eye subscore is 4. GCS verbal subscore is 5. GCS motor subscore is 6.       Grip strengths normal ,equal bilaterally.  EOMs intact.     ED Course  Procedures (including critical care time)  Labs Reviewed - No data to display No results found.   1. Chronic headache   2. Depression       MDM  Patient with unchanged headache x 2 months, negative head CT and trial course of antibiotics for sinusitis.  Patient is afebrile, no neurological symptoms or findings.  I suspect headache may be muscular/stress related or related to depression.  Pt admits to increased depression but denies SI- states he has good follow up with his therapist and will follow up tomorrow.  I have prescribed pain medication and recommended close follow up with his therapist and also with the headache center.  Pt verbalizes understanding and agrees with plan.          Dillard Cannon [place], [place] 06/14/11 [phone]

## 2011-06-13 NOTE — ED Notes (Signed)
Per Pt: pt reports headache for 2 months, chronic knee pain for over one year, frequent visits to PCP "she don't say nothing" (last visit to PCP yesterday); several visits to ED; recently received prescriptions for amoxiciliin (sinusitis) and Vicoden. Reports he is out of Vicoden and the antibiotic has not help his headaches.

## 2011-06-14 NOTE — ED Provider Notes (Signed)
Medical screening examination/treatment/procedure(s) were performed by non-physician practitioner and as supervising physician I was immediately available for consultation/collaboration.    Celene Kras, MD 06/14/11 [phone]

## 2011-07-01 ENCOUNTER — Emergency Department (HOSPITAL_BASED_OUTPATIENT_CLINIC_OR_DEPARTMENT_OTHER)
Admission: EM | Admit: 2011-07-01 | Discharge: 2011-07-01 | Disposition: A | Discharge status: Home / Self Care | Payer: Medicaid Other | Attending: Emergency Medicine | Admitting: Emergency Medicine

## 2011-07-01 ENCOUNTER — Encounter (HOSPITAL_BASED_OUTPATIENT_CLINIC_OR_DEPARTMENT_OTHER): Payer: Self-pay | Admitting: *Deleted

## 2011-07-01 DIAGNOSIS — F17200 Nicotine dependence, unspecified, uncomplicated: Secondary | ICD-10-CM | POA: Insufficient documentation

## 2011-07-01 DIAGNOSIS — M549 Dorsalgia, unspecified: Secondary | ICD-10-CM | POA: Insufficient documentation

## 2011-07-01 DIAGNOSIS — I10 Essential (primary) hypertension: Secondary | ICD-10-CM | POA: Insufficient documentation

## 2011-07-01 DIAGNOSIS — G8929 Other chronic pain: Secondary | ICD-10-CM | POA: Insufficient documentation

## 2011-07-01 DIAGNOSIS — M25569 Pain in unspecified knee: Secondary | ICD-10-CM | POA: Insufficient documentation

## 2011-07-01 MED ORDER — KETOROLAC TROMETHAMINE 60 MG/2ML IM SOLN
60.0000 mg | Freq: Once | INTRAMUSCULAR | Status: AC
  Administered 2011-07-01: 60 mg via INTRAMUSCULAR
  Filled 2011-07-01: qty 2

## 2011-07-01 MED ORDER — TRAMADOL HCL 50 MG PO TABS: 50.0000 mg | ORAL_TABLET | Freq: Four times a day (QID) | ORAL | Status: DC | PRN

## 2011-07-01 NOTE — ED Notes (Signed)
Pt here for chronic knee and back pain

## 2011-07-01 NOTE — ED Provider Notes (Signed)
Medical screening examination/treatment/procedure(s) were performed by non-physician practitioner and as supervising physician I was immediately available for consultation/collaboration.   Hollan Philipp, MD 07/01/11 2312 

## 2011-07-01 NOTE — ED Provider Notes (Signed)
History     CSN: [id number]  Arrival date & time 07/01/11  1924   First MD Initiated Contact with Patient 07/01/11 2039      Chief Complaint  Patient presents with  . Knee Pain  . Back Pain    (Consider location/radiation/quality/duration/timing/severity/associated sxs/prior treatment) Patient is a 56 y.o. male presenting with knee pain and back pain. The history is provided by the patient. No language interpreter was used.  Knee Pain This is a chronic problem. The current episode started more than 1 year ago. The problem occurs constantly. The problem has been gradually worsening. Associated symptoms include headaches, joint swelling and myalgias. Pertinent negatives include no abdominal pain. The symptoms are aggravated by bending. He has tried acetaminophen for the symptoms. The treatment provided moderate relief.  Back Pain  Associated symptoms include headaches. Pertinent negatives include no abdominal pain.  Pt reports he has chronic knee pain and back pain.  Pt request referral to Orthopaedist.  Pt reports he has seen Dr. Eulah Pont but wants another opinion.  Past Medical History  Diagnosis Date  . Hypertension   . Depression   . 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 No pertinent past surgical history.  History reviewed. No pertinent family history.  History  Substance Use Topics  . Smoking status: Current Everyday Smoker    Types: Cigarettes  . Smokeless tobacco: Not on file  . Alcohol Use: No      Review of Systems  Gastrointestinal: Negative for abdominal pain.  Musculoskeletal: Positive for myalgias, back pain and joint swelling.  Neurological: Positive for headaches.  All other systems reviewed and are negative.    Allergies  Review of patient's allergies indicates no known allergies.  Home Medications   Current Outpatient Rx  Name Route Sig Dispense Refill  . ACETAMINOPHEN 500 MG PO TABS Oral Take 1,000 mg  by mouth every 6 (six) hours as needed. For pain    . CALCIUM CARBONATE 600 MG PO TABS Oral Take 600 mg by mouth daily.    . IBUPROFEN 200 MG PO TABS Oral Take 600 mg by mouth once.    Marland Kitchen PRESCRIPTION MEDICATION Oral Take 1 tablet by mouth daily. Unknown blood pressure medication    . TRAMADOL HCL 50 MG PO TABS Oral Take 50 mg by mouth 4 (four) times daily as needed. For pain. Maximum dose= 8 tablets per day       BP 137/89  Pulse 78  Temp(Src) 98.2 F (36.8 C) (Oral)  Resp 17  SpO2 100%  Physical Exam  Nursing note and vitals reviewed. Constitutional: He is oriented to person, place, and time. He appears well-developed and well-nourished.  HENT:  Head: Normocephalic.  Eyes: Pupils are equal, round, and reactive to light.  Neck: Normal range of motion.  Cardiovascular: Normal rate.   Pulmonary/Chest: Effort normal.  Abdominal: Soft.  Musculoskeletal: He exhibits tenderness.  Neurological: He is alert and oriented to person, place, and time. He has normal reflexes.  Skin: Skin is warm.  Psychiatric: He has a normal mood and affect.    ED Course  Procedures (including critical care time)  Labs Reviewed - No data to display No results found.   No diagnosis found.    MDM  Pt given referral to Dr. Lajoyce Corners Orthopaedist.  I will treat with Ultram        Langston Masker, PA 07/01/11 2117

## 2011-07-19 ENCOUNTER — Other Ambulatory Visit: Payer: Self-pay

## 2011-07-19 ENCOUNTER — Encounter (HOSPITAL_BASED_OUTPATIENT_CLINIC_OR_DEPARTMENT_OTHER): Payer: Self-pay | Admitting: *Deleted

## 2011-07-19 ENCOUNTER — Emergency Department (INDEPENDENT_AMBULATORY_CARE_PROVIDER_SITE_OTHER): Payer: Medicaid Other

## 2011-07-19 ENCOUNTER — Emergency Department (HOSPITAL_BASED_OUTPATIENT_CLINIC_OR_DEPARTMENT_OTHER)
Admission: EM | Admit: 2011-07-19 | Discharge: 2011-07-19 | Disposition: A | Discharge status: Home / Self Care | Payer: Medicaid Other | Attending: Emergency Medicine | Admitting: Emergency Medicine

## 2011-07-19 DIAGNOSIS — R0602 Shortness of breath: Secondary | ICD-10-CM

## 2011-07-19 DIAGNOSIS — B349 Viral infection, unspecified: Secondary | ICD-10-CM

## 2011-07-19 DIAGNOSIS — I289 Disease of pulmonary vessels, unspecified: Secondary | ICD-10-CM

## 2011-07-19 DIAGNOSIS — K219 Gastro-esophageal reflux disease without esophagitis: Secondary | ICD-10-CM | POA: Insufficient documentation

## 2011-07-19 DIAGNOSIS — C4492 Squamous cell carcinoma of skin, unspecified: Secondary | ICD-10-CM | POA: Insufficient documentation

## 2011-07-19 DIAGNOSIS — F17200 Nicotine dependence, unspecified, uncomplicated: Secondary | ICD-10-CM | POA: Insufficient documentation

## 2011-07-19 DIAGNOSIS — R5381 Other malaise: Secondary | ICD-10-CM | POA: Insufficient documentation

## 2011-07-19 DIAGNOSIS — B9789 Other viral agents as the cause of diseases classified elsewhere: Secondary | ICD-10-CM | POA: Insufficient documentation

## 2011-07-19 DIAGNOSIS — I700 Atherosclerosis of aorta: Secondary | ICD-10-CM

## 2011-07-19 LAB — COMPREHENSIVE METABOLIC PANEL
BUN: 10 mg/dL (ref 6–23)
Calcium: 10.7 mg/dL — ABNORMAL HIGH (ref 8.4–10.5)
Creatinine, Ser: 0.8 mg/dL (ref 0.50–1.35)
GFR calc Af Amer: >90 mL/min (ref >90)
GFR calc non Af Amer: >90 mL/min (ref >90)
Glucose, Bld: 202 mg/dL — ABNORMAL HIGH (ref 70–99)
Sodium: 141 mEq/L (ref 135–145)
Total Protein: 7 g/dL (ref 6.0–8.3)

## 2011-07-19 LAB — CBC
Hemoglobin: 16.4 g/dL (ref 13.0–17.0)
MCH: 31.7 pg (ref 26.0–34.0)
RBC: 5.17 MIL/uL (ref 4.22–5.81)
WBC: 15.4 10*3/uL — ABNORMAL HIGH (ref 4.0–10.5)

## 2011-07-19 LAB — DIFFERENTIAL
Eosinophils Absolute: 0 10*3/uL (ref 0.0–0.7)
Lymphocytes Relative: 5 % — ABNORMAL LOW (ref 12–46)
Lymphs Abs: 0.7 10*3/uL (ref 0.7–4.0)
Monocytes Relative: 1 % — ABNORMAL LOW (ref 3–12)
Neutro Abs: 14.6 10*3/uL — ABNORMAL HIGH (ref 1.7–7.7)
Neutrophils Relative %: 95 % — ABNORMAL HIGH (ref 43–77)

## 2011-07-19 LAB — URINALYSIS, ROUTINE W REFLEX MICROSCOPIC
Nitrite: NEGATIVE
Protein, ur: NEGATIVE mg/dL
Specific Gravity, Urine: 1.034 — ABNORMAL HIGH (ref 1.005–1.030)
Urobilinogen, UA: 0.2 mg/dL (ref 0.0–1.0)

## 2011-07-19 LAB — TROPONIN I: Troponin I: <0.3 ng/mL (ref <0.30)

## 2011-07-19 LAB — URINE MICROSCOPIC-ADD ON

## 2011-07-19 LAB — LIPASE, BLOOD: Lipase: 16 U/L (ref 11–59)

## 2011-07-19 MED ORDER — SODIUM CHLORIDE 0.9 % IV BOLUS (SEPSIS)
1000.0000 mL | Freq: Once | INTRAVENOUS | Status: AC
  Administered 2011-07-19: 1000 mL via INTRAVENOUS

## 2011-07-19 MED ORDER — FAMOTIDINE 20 MG PO TABS: 20.0000 mg | ORAL_TABLET | Freq: Two times a day (BID) | ORAL | Status: DC

## 2011-07-19 MED ORDER — PROMETHAZINE HCL 25 MG PO TABS: 25.0000 mg | ORAL_TABLET | Freq: Four times a day (QID) | ORAL | Status: AC | PRN

## 2011-07-19 MED ORDER — GI COCKTAIL ~~LOC~~
30.0000 mL | Freq: Once | ORAL | Status: AC
  Administered 2011-07-19: 30 mL via ORAL
  Filled 2011-07-19: qty 30

## 2011-07-19 MED ORDER — ONDANSETRON HCL 4 MG/2ML IJ SOLN
4.0000 mg | Freq: Once | INTRAMUSCULAR | Status: AC
  Administered 2011-07-19: 4 mg via INTRAVENOUS
  Filled 2011-07-19: qty 2

## 2011-07-19 MED ORDER — KETOROLAC TROMETHAMINE 30 MG/ML IJ SOLN
30.0000 mg | Freq: Once | INTRAMUSCULAR | Status: AC
  Administered 2011-07-19: 30 mg via INTRAVENOUS
  Filled 2011-07-19: qty 1

## 2011-07-19 NOTE — ED Notes (Signed)
Dr. Hunt at bedside.

## 2011-07-19 NOTE — ED Provider Notes (Signed)
History     CSN: [id number]  Arrival date & time 07/19/11  5621   First MD Initiated Contact with Patient 07/19/11 0701      Chief Complaint  Patient presents with  . Weakness    (Consider location/radiation/quality/duration/timing/severity/associated sxs/prior treatment) HPI Patient is a 56 year old male who presents today complaining of generalized weakness since he woke up 2 hours ago. Patient reports that he woke up and felt like he just couldn't get up by himself. He denies any chest pain or shortness of breath. He denies any focal weakness. Patient describes diffuse aches and pains. He is alert and oriented x4. He denies any recent fevers or urinary symptoms. Patient has not had any recent cough but is a smoker. Of note patient has a history of squamous cell carcinoma and goes to [facility] for dermatology treatments. Yesterday while he was at his appointment he was noted to have an elevated diastolic blood pressure and asked if he could have an aunt acid at his doctor's appointment. He was referred to the emergency department at [facility]. There the patient reports that they performed a workup including a CAT scan of his head as well as chest x-ray and blood tests. Patient was told that everything was fine and was sent home last night. Patient then presents this morning after having this episode of diaphoresis. There are no other new or different symptoms today. Patient complains of 5/10 diffuse body pain including his upper abdomen. He is hemodynamically stable and in no acute distress.There are no other associated or modifying factors.  Past Medical History  Diagnosis Date  . Hypertension   . Depression   . 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 Skin cancer excision   . Left wrist     No family history on file.  History  Substance Use Topics  . Smoking status: Current Everyday Smoker    Types:  Cigarettes  . Smokeless tobacco: Not on file  . Alcohol Use: No      Review of Systems  HENT: Negative.   Eyes: Negative.   Cardiovascular: Negative.   Gastrointestinal: Positive for abdominal pain.  Genitourinary: Negative.   Musculoskeletal: Positive for myalgias and arthralgias.  Skin: Negative.   Neurological: Positive for weakness.  Hematological: Negative.   Psychiatric/Behavioral: Negative.   All other systems reviewed and are negative.    Allergies  Review of patient's allergies indicates no known allergies.  Home Medications   Current Outpatient Rx  Name Route Sig Dispense Refill  . ACETAMINOPHEN 500 MG PO TABS Oral Take 1,000 mg by mouth every 6 (six) hours as needed. For pain    . CALCIUM CARBONATE 600 MG PO TABS Oral Take 600 mg by mouth daily.    . IBUPROFEN 200 MG PO TABS Oral Take 600 mg by mouth once.    . TRAMADOL HCL 50 MG PO TABS Oral Take 1 tablet (50 mg total) by mouth 4 (four) times daily as needed. For pain. Maximum dose= 8 tablets per day 20 tablet 0  . PRESCRIPTION MEDICATION Oral Take 1 tablet by mouth daily. Unknown blood pressure medication      BP 154/94  Pulse 99  Temp(Src) 97.5 F (36.4 C) (Oral)  Resp 18  Ht 5\' 10"  (1.778 m)  Wt 190 lb (86.183 kg)  BMI 27.26 kg/m2  SpO2 98%  Physical Exam  Nursing note  and vitals reviewed. GEN: Well-developed, well-nourished male in no distress HEENT: Atraumatic, normocephalic. Oropharynx clear without erythema EYES: PERRLA BL, no scleral icterus. NECK: Trachea midline, no meningismus CV: regular rate and rhythm. No murmurs, rubs, or gallops PULM: No respiratory distress.  No crackles, wheezes, or rales. Diminished throughout GI: soft, non-tender. No guarding, rebound, or tenderness. + bowel sounds  Neuro: cranial nerves 2-12 intact, no abnormalities of strength or sensation, A and O x 3 MSK: Patient moves all 4 extremities symmetrically, no deformity, edema, or injury noted Psych: no  abnormality of mood   ED Course  Procedures (including critical care time)    Date: 07/19/2011  Rate: 91  Rhythm: normal sinus rhythm  QRS Axis: normal  Intervals: normal  ST/T Wave abnormalities: normal  Conduction Disutrbances: none  Narrative Interpretation:   Old EKG Reviewed: No significant changes noted     Labs Reviewed  CBC - Abnormal; Notable for the following:    WBC 15.4 (*)    MCHC 36.2 (*)    Platelets 122 (*)    All other components within normal limits  DIFFERENTIAL - Abnormal; Notable for the following:    Neutrophils Relative 95 (*)    Neutro Abs 14.6 (*)    Lymphocytes Relative 5 (*)    Monocytes Relative 1 (*)    All other components within normal limits  COMPREHENSIVE METABOLIC PANEL - Abnormal; Notable for the following:    Glucose, Bld 202 (*)    Calcium 10.7 (*)    All other components within normal limits  URINALYSIS, ROUTINE W REFLEX MICROSCOPIC - Abnormal; Notable for the following:    Specific Gravity, Urine 1.034 (*)    Glucose, UA >1000 (*)    Ketones, ur 15 (*)    All other components within normal limits  URINE MICROSCOPIC-ADD ON - Abnormal; Notable for the following:    Bacteria, UA FEW (*)    Casts HYALINE CASTS (*)    All other components within normal limits  LIPASE, BLOOD  TROPONIN I   Dg Chest 2 View  07/19/2011  *RADIOLOGY REPORT*  Clinical Data: Weakness and shortness of breath.  History of smoking.  Hypertension.  History of skin cancer.  CHEST - 2 VIEW  Comparison: Chest x-ray 12/12/2008.  Findings: Lung volumes are normal.  No consolidative airspace disease.  No pleural effusions.  Mild cephalization of the pulmonary vasculature without frank pulmonary edema.  Heart size is borderline enlarged, with prominence of left ventricular contour. Atherosclerosis of the thoracic aorta.  IMPRESSION: 1.  Mild pulmonary vascular congestion without frank pulmonary edema. 2.  Borderline cardiomegaly with prominence of left ventricular  contour which could suggest left ventricular hypertrophy. 3.  Atherosclerosis.  Original Report Authenticated By: Florencia Reasons, M.D.     1. Viral syndrome   2. GERD (gastroesophageal reflux disease)       MDM  Patient was evaluated by myself. Patient was in no acute distress was him dynamically stable. He did not have any focal neurologic symptoms. Patient denied any chest pain when asked repeatedly. He did have some upper, pain. Given his age, smoking, hypertension, and hypercholesterolemia as well as his focus on abdominal discomfort a workup for possible abdominal pain versus ACS was initiated though I had very low suspicion for ACS. Records have been requested from [facility].  Records were obtained from [facility] Apparently the patient had a dose of Solu-Medrol last night though he denies any problems with his breathing. Documentation was somewhat sparse.  Patient did have negative chest x-ray as well as normal EKG and laboratory workup including normal CBC and renal panel. Here patient had no signs of infiltrate on chest x-ray. He did have a white count of 15.7 and some hyperglycemia to 200. He did not have a gap with this and he is nondiabetic. I suspect these are secondary to the dose of Solu-Medrol he received yesterday evening. Patient did describe diffuse myalgias consistent with a viral syndrome. He remained hemodynamically stable here. He did have some mild sinus tachycardia and was treated with IV fluids for this. His EKG was unremarkable as was his urinalysis aside from some glucosuria. Patient was feeling much better prior to discharge. He did have one episode where he had some nausea. He was treated with Toradol as well as Zofran. Patient did have significant relief from GI cocktail as well. He was discharged with prescription for Pepcid as well as Phenergan as needed for nausea. He was told that if he develops fevers or has other emergent concerns he is welcome to return. The  Tamiflu is provided today as patient was on day 5 of the illness and has no other comorbidities that would merit this.         Cyndra Numbers, MD 07/19/11 [phone]

## 2011-07-19 NOTE — ED Notes (Signed)
Pt brought in my EMS today for weakness. Pt was seen at [facility] ER last night for r/o of cardiac problems. Pt states he had a routine f/u visit at [facility] yesterday for skin cancer that he sees their dermatology clinic for. While he was there, he asked for a TUMS for increased sensation of indigestion that he's had over the past four days. Pt denies hx of GERD. The nurses then checked his BP, noted that it was elevated, and encouraged the pt to be seen in the ER to r/o anything cardiac. Pt states he had blood tests, ECG, and CXR "and it was all normal and they told me i could go home". Pt then made the drive home, went to bed as normal last night, then woke up at 4am stating his pillow was drenched from his sweat (and pt does admit to having the sweating episodes for the past 5 days) and also full body weakness. Pt states he was too weak to lift his extremities off of the bed. Stated it took him an hour to get dressed this morning. Here for continued weakness. Pt denies any new meds or diet. States he is being treated for localized areas of cancer to his skin, but denies any recent new treatment.

## 2011-07-19 NOTE — ED Notes (Signed)
Pt states he also had a CT head yesterday "and they said everything was fine"

## 2011-07-19 NOTE — Discharge Instructions (Signed)
Gastroesophageal Reflux Disease, Adult Gastroesophageal reflux disease (GERD) happens when acid from your stomach flows up into the esophagus. When acid comes in contact with the esophagus, the acid causes soreness (inflammation) in the esophagus. Over time, GERD may create small holes (ulcers) in the lining of the esophagus. CAUSES   Increased body weight. This puts pressure on the stomach, making acid rise from the stomach into the esophagus.   Smoking. This increases acid production in the stomach.   Drinking alcohol. This causes decreased pressure in the lower esophageal sphincter (valve or ring of muscle between the esophagus and stomach), allowing acid from the stomach into the esophagus.   Late evening meals and a full stomach. This increases pressure and acid production in the stomach.   A malformed lower esophageal sphincter.  Sometimes, no cause is found. SYMPTOMS   Burning pain in the lower part of the mid-chest behind the breastbone and in the mid-stomach area. This may occur twice a week or more often.   Trouble swallowing.   Sore throat.   Dry cough.   Asthma-like symptoms including chest tightness, shortness of breath, or wheezing.  DIAGNOSIS  Your caregiver may be able to diagnose GERD based on your symptoms. In some cases, X-rays and other tests may be done to check for complications or to check the condition of your stomach and esophagus. TREATMENT  Your caregiver may recommend over-the-counter or prescription medicines to help decrease acid production. Ask your caregiver before starting or adding any new medicines.  HOME CARE INSTRUCTIONS   Change the factors that you can control. Ask your caregiver for guidance concerning weight loss, quitting smoking, and alcohol consumption.   Avoid foods and drinks that make your symptoms worse, such as:   Caffeine or alcoholic drinks.   Chocolate.   Peppermint or mint flavorings.   Garlic and onions.   Spicy foods.     Citrus fruits, such as oranges, lemons, or limes.   Tomato-based foods such as sauce, chili, salsa, and pizza.   Fried and fatty foods.   Avoid lying down for the 3 hours prior to your bedtime or prior to taking a nap.   Eat small, frequent meals instead of large meals.   Wear loose-fitting clothing. Do not wear anything tight around your waist that causes pressure on your stomach.   Raise the head of your bed 6 to 8 inches with wood blocks to help you sleep. Extra pillows will not help.   Only take over-the-counter or prescription medicines for pain, discomfort, or fever as directed by your caregiver.   Do not take aspirin, ibuprofen, or other nonsteroidal anti-inflammatory drugs (NSAIDs).  SEEK IMMEDIATE MEDICAL CARE IF:   You have pain in your arms, neck, jaw, teeth, or back.   Your pain increases or changes in intensity or duration.   You develop nausea, vomiting, or sweating (diaphoresis).   You develop shortness of breath, or you faint.   Your vomit is green, yellow, black, or looks like coffee grounds or blood.   Your stool is red, bloody, or black.  These symptoms could be signs of other problems, such as heart disease, gastric bleeding, or esophageal bleeding. MAKE SURE YOU:   Understand these instructions.   Will watch your condition.   Will get help right away if you are not doing well or get worse.  Document Released: 02/27/2005 Document Revised: 01/30/2011 Document Reviewed: 12/07/2010 [facility] Patient Information 2012 [place], [place].Viral Syndrome You or your child has Viral Syndrome. It  is the most common infection causing "colds" and infections in the nose, throat, sinuses, and breathing tubes. Sometimes the infection causes nausea, vomiting, or diarrhea. The germ that causes the infection is a virus. No antibiotic or other medicine will kill it. There are medicines that you can take to make you or your child more comfortable.  HOME CARE INSTRUCTIONS    Rest in bed until you start to feel better.   If you have diarrhea or vomiting, eat small amounts of crackers and toast. Soup is helpful.   Do not give aspirin or medicine that contains aspirin to children.   Only take over-the-counter or prescription medicines for pain, discomfort, or fever as directed by your caregiver.  SEEK IMMEDIATE MEDICAL CARE IF:   You or your child has not improved within one week.   You or your child has pain that is not at least partially relieved by over-the-counter medicine.   Thick, colored mucus or blood is coughed up.   Discharge from the nose becomes thick yellow or green.   Diarrhea or vomiting gets worse.   There is any major change in your or your child's condition.   You or your child develops a skin rash, stiff neck, severe headache, or are unable to hold down food or fluid.   You or your child has an oral temperature above 102 F (38.9 C), not controlled by medicine.   Your baby is older than 3 months with a rectal temperature of 102 F (38.9 C) or higher.   Your baby is 56 months old or younger with a rectal temperature of 100.4 F (38 C) or higher.  Document Released: 05/05/2006 Document Revised: 01/30/2011 Document Reviewed: 05/06/2007 [facility] Patient Information 2012 [place], [place].

## 2011-08-04 ENCOUNTER — Emergency Department (HOSPITAL_COMMUNITY)
Admission: EM | Admit: 2011-08-04 | Discharge: 2011-08-04 | Disposition: A | Discharge status: Home / Self Care | Payer: Medicaid Other | Attending: Emergency Medicine | Admitting: Emergency Medicine

## 2011-08-04 ENCOUNTER — Encounter (HOSPITAL_COMMUNITY): Payer: Self-pay

## 2011-08-04 DIAGNOSIS — R51 Headache: Secondary | ICD-10-CM | POA: Insufficient documentation

## 2011-08-04 DIAGNOSIS — F329 Major depressive disorder, single episode, unspecified: Secondary | ICD-10-CM | POA: Insufficient documentation

## 2011-08-04 DIAGNOSIS — I10 Essential (primary) hypertension: Secondary | ICD-10-CM | POA: Insufficient documentation

## 2011-08-04 DIAGNOSIS — M25569 Pain in unspecified knee: Secondary | ICD-10-CM | POA: Insufficient documentation

## 2011-08-04 DIAGNOSIS — F3289 Other specified depressive episodes: Secondary | ICD-10-CM | POA: Insufficient documentation

## 2011-08-04 DIAGNOSIS — Z79899 Other long term (current) drug therapy: Secondary | ICD-10-CM | POA: Insufficient documentation

## 2011-08-04 DIAGNOSIS — M25562 Pain in left knee: Secondary | ICD-10-CM

## 2011-08-04 DIAGNOSIS — G8929 Other chronic pain: Secondary | ICD-10-CM | POA: Insufficient documentation

## 2011-08-04 HISTORY — DX: Other chronic pain: G89.29

## 2011-08-04 HISTORY — DX: Headache: R51

## 2011-08-04 MED ORDER — PROCHLORPERAZINE EDISYLATE 5 MG/ML IJ SOLN
5.0000 mg | Freq: Once | INTRAMUSCULAR | Status: AC
  Administered 2011-08-04: 5 mg via INTRAMUSCULAR
  Filled 2011-08-04: qty 2

## 2011-08-04 MED ORDER — KETOROLAC TROMETHAMINE 60 MG/2ML IM SOLN
60.0000 mg | Freq: Once | INTRAMUSCULAR | Status: AC
  Administered 2011-08-04: 60 mg via INTRAMUSCULAR
  Filled 2011-08-04: qty 2

## 2011-08-04 MED ORDER — DIPHENHYDRAMINE HCL 50 MG/ML IJ SOLN
25.0000 mg | Freq: Once | INTRAMUSCULAR | Status: AC
  Administered 2011-08-04: 25 mg via INTRAMUSCULAR
  Filled 2011-08-04: qty 1

## 2011-08-04 MED ORDER — HYDROCODONE-ACETAMINOPHEN 5-325 MG PO TABS: 1.0000 | ORAL_TABLET | ORAL | Status: AC | PRN

## 2011-08-04 NOTE — ED Notes (Signed)
Chronic pain seen here for same presenting complaints. Was given RX for pain that gave good relief.  Requesting same medication

## 2011-08-04 NOTE — Discharge Instructions (Signed)
It is important that you followup with a headache specialist for further evaluation and treatment for your headaches. You've been given a very small amount of Norco, although this is not the optimal treatment for headache. Please see the contact information above for [facility]. Plan to make a followup with Dr. Eulah Pont for your knee pain. Return to the ER for worsening condition.  Chronic Pain Management Managing chronic pain is not easy. The goal is to provide as much pain relief as possible. There are emotional as well as physical problems. Chronic pain may lead to symptoms of depression which magnify those of the pain. Problems may include:  Anxiety.   Sleep disturbances.   Confused thinking.   Feeling cranky.   Fatigue.   Weight gain or loss.  Identify the source of the pain first, if possible. The pain may be masking another problem. Try to find a pain management specialist or clinic. Work with a team to create a treatment plan for you. MEDICATIONS  May include narcotics or opioids. Larger than normal doses may be needed to control your pain.   Drugs for depression may help.   Over-the-counter medicines may help for some conditions. These drugs may be used along with others for better pain relief.   May be injected into sites such as the spine and joints. Injections may have to be repeated if they wear off.  THERAPY MAY INCLUDE:  Working with a physical therapist to keep from getting stiff.   Regular, gentle exercise.   Cognitive or behavioral therapy.   Using complementary or integrative medicine such as:   Acupuncture.   Massage, Reiki, or Rolfing.   Aroma, color, light, or sound therapy.   Group support.  FOR MORE INFORMATION [email or web address]. American Chronic [employer] Association [email or web address]. Document Released: 06/27/2004 Document Revised: 05/09/2011 Document Reviewed: 08/06/2007 [facility] Patient Information 2012  [place], [place].

## 2011-08-04 NOTE — ED Provider Notes (Signed)
History     CSN: [id number]  Arrival date & time 08/04/11  1191   First MD Initiated Contact with Patient 08/04/11 2122      Chief Complaint  Patient presents with  . Headache  . Knee Pain  . Pain    (Consider location/radiation/quality/duration/timing/severity/associated sxs/prior treatment) Patient is a 56 y.o. male presenting with headaches and knee pain. The history is provided by the patient.  Headache  This is a chronic problem. The current episode started more than 2 days ago. The problem occurs constantly. The problem has not changed since onset.The headache is associated with nothing. The pain is located in the frontal region. The quality of the pain is described as dull and throbbing. The pain is moderate. The pain does not radiate. Pertinent negatives include no fever, no chest pressure, no palpitations, no shortness of breath, no nausea and no vomiting. He has tried acetaminophen and NSAIDs (and ultram) for the symptoms. The treatment provided moderate relief.  Knee Pain Associated symptoms include headaches. Pertinent negatives include no abdominal pain, chest pain, chills, fever, myalgias, nausea, neck pain, numbness, rash, sore throat, vomiting or weakness.   Pt states he has had intermittent headaches for the past several months. He has been seen here for the same in the past and treated with abx for sinusitis with no relief. Has been evaluated with head CT which was unremarkable. [facility] but has not been able to make an appointment with them yet. States "I don't want to come here [to the ED] but sometimes the headache gets so bad I have to get some relief." Denies photo/phonophobia, neck pain, nausea, vomiting. No fevers, nasal congestion, visual change, focal neuro deficits. HA has not significantly changed in location or quality since it began.  Pt also c/o his chronic knee pain - he is followed by Dr. Eulah Pont and states that they are  planning TKA for some point in the future. He is currently receiving treatment for several squamous cell skin carcinomas at [facility] and states that they are trying to get that resolved before proceeding with TKA.  Past Medical History  Diagnosis Date  . Hypertension   . Depression   . Cancer     squamous cell "all in my face arms and hands"  . Chronic pain   . Headache     Past Surgical History  Procedure Date  . Skin cancer excision   . Left wrist     No family history on file.  History  Substance Use Topics  . Smoking status: Current Everyday Smoker    Types: Cigarettes  . Smokeless tobacco: Not on file  . Alcohol Use: No      Review of Systems  Constitutional: Negative for fever, chills, activity change and appetite change.  HENT: Negative for hearing loss, sore throat, rhinorrhea, neck pain, neck stiffness and tinnitus.   Eyes: Negative for photophobia and visual disturbance.  Respiratory: Negative for chest tightness and shortness of breath.   Cardiovascular: Negative for chest pain and palpitations.  Gastrointestinal: Negative for nausea, vomiting and abdominal pain.  Musculoskeletal: Negative for myalgias.  Skin: Negative for rash.  Neurological: Positive for headaches. Negative for dizziness, seizures, syncope, facial asymmetry, speech difficulty, weakness and numbness.    Allergies  Review of patient's allergies indicates no known allergies.  Home Medications   Current Outpatient Rx  Name Route Sig Dispense Refill  . ACETAMINOPHEN 500 MG PO TABS Oral Take 1,000 mg by  mouth every 6 (six) hours as needed. For pain    . CALCIUM CARBONATE 600 MG PO TABS Oral Take 600 mg by mouth daily.    Marland Kitchen FAMOTIDINE 20 MG PO TABS Oral Take 1 tablet (20 mg total) by mouth 2 (two) times daily. 60 tablet 0  . IBUPROFEN 200 MG PO TABS Oral Take 600 mg by mouth once.    Marland Kitchen PRESCRIPTION MEDICATION Oral Take 1 tablet by mouth daily. Unknown blood pressure medication    . TRAMADOL HCL  50 MG PO TABS Oral Take 1 tablet (50 mg total) by mouth 4 (four) times daily as needed. For pain. Maximum dose= 8 tablets per day 20 tablet 0    BP 144/95  Pulse 84  Temp(Src) 98.5 F (36.9 C) (Oral)  Resp 16  SpO2 100%  Physical Exam  Nursing note and vitals reviewed. Constitutional: He is oriented to person, place, and time. He appears well-developed and well-nourished. No distress.  HENT:  Head: Normocephalic and atraumatic.  Right Ear: External ear normal.  Left Ear: External ear normal.  Nose: Nose normal.  Eyes: EOM are normal. Pupils are equal, round, and reactive to light.  Neck: Normal range of motion. Neck supple.  Cardiovascular: Normal rate, regular rhythm and normal heart sounds.   Pulmonary/Chest: Effort normal and breath sounds normal. No respiratory distress. He has no wheezes.  Abdominal: Soft. Bowel sounds are normal. There is no tenderness. There is no rebound and no guarding.  Musculoskeletal: Normal range of motion.  Neurological: He is alert and oriented to person, place, and time. No cranial nerve deficit. He exhibits normal muscle tone. Coordination normal.  Skin: Skin is warm and dry. No rash noted. He is not diaphoretic.  Psychiatric: He has a normal mood and affect.    ED Course  Procedures (including critical care time)  Labs Reviewed - No data to display No results found.   1. Chronic pain of left knee   2. Headache       MDM  Pt with HAs for the past several months which has been evaluated in the past with head CT. No significant change in its nature since then. Hasn't been seen by headache specialist/neurologist. No neuro deficits on exam today. He was given HA cocktail which he stated decreased his pain and he felt ready to go home. Emphasized the importance of following up with [facility]. Return precautions discussed.        Grant Fontana, [place] 08/05/11 1312

## 2011-08-06 NOTE — ED Provider Notes (Signed)
Medical screening examination/treatment/procedure(s) were performed by non-physician practitioner and as supervising physician I was immediately available for consultation/collaboration.   Ebenezer Mccaskey M Milferd Ansell, DO 08/06/11 2330 

## 2011-08-20 ENCOUNTER — Emergency Department (HOSPITAL_COMMUNITY)
Admission: EM | Admit: 2011-08-20 | Discharge: 2011-08-20 | Disposition: A | Discharge status: Home / Self Care | Payer: Medicaid Other | Attending: Emergency Medicine | Admitting: Emergency Medicine

## 2011-08-20 ENCOUNTER — Encounter (HOSPITAL_COMMUNITY): Payer: Self-pay

## 2011-08-20 DIAGNOSIS — F3289 Other specified depressive episodes: Secondary | ICD-10-CM | POA: Insufficient documentation

## 2011-08-20 DIAGNOSIS — Z85828 Personal history of other malignant neoplasm of skin: Secondary | ICD-10-CM | POA: Insufficient documentation

## 2011-08-20 DIAGNOSIS — F329 Major depressive disorder, single episode, unspecified: Secondary | ICD-10-CM | POA: Insufficient documentation

## 2011-08-20 DIAGNOSIS — M170 Bilateral primary osteoarthritis of knee: Secondary | ICD-10-CM

## 2011-08-20 DIAGNOSIS — I10 Essential (primary) hypertension: Secondary | ICD-10-CM | POA: Insufficient documentation

## 2011-08-20 DIAGNOSIS — G8929 Other chronic pain: Secondary | ICD-10-CM | POA: Insufficient documentation

## 2011-08-20 DIAGNOSIS — M1710 Unilateral primary osteoarthritis, unspecified knee: Secondary | ICD-10-CM | POA: Insufficient documentation

## 2011-08-20 DIAGNOSIS — F17200 Nicotine dependence, unspecified, uncomplicated: Secondary | ICD-10-CM | POA: Insufficient documentation

## 2011-08-20 MED ORDER — HYDROCODONE-ACETAMINOPHEN 5-325 MG PO TABS: 2.0000 | ORAL_TABLET | Freq: Four times a day (QID) | ORAL | Status: AC | PRN

## 2011-08-20 NOTE — Discharge Instructions (Signed)
Osteoarthritis Osteoarthritis is the most common form of arthritis. It is redness, soreness, and swelling (inflammation) affecting the cartilage. Cartilage acts as a cushion, covering the ends of bones where they meet to form a joint. CAUSES  Over time, the cartilage begins to wear away. This causes bone to rub on bone. This produces pain and stiffness in the affected joints. Factors that contribute to this problem are:  Excessive body weight.   Age.   Overuse of joints.  SYMPTOMS   People with osteoarthritis usually experience joint pain, swelling, or stiffness.   Over time, the joint may lose its normal shape.   Small deposits of bone (osteophytes) may grow on the edges of the joint.   Bits of bone or cartilage can break off and float inside the joint space. This may cause more pain and damage.   Osteoarthritis can lead to depression, anxiety, feelings of helplessness, and limitations on daily activities.  The most commonly affected joints are in the:  Ends of the fingers.   Thumbs.   Neck.   Lower back.   Knees.   Hips.  DIAGNOSIS  Diagnosis is mostly based on your symptoms and exam. Tests may be helpful, including:  X-rays of the affected joint.   A computerized magnetic scan (MRI).   Blood tests to rule out other types of arthritis.   Joint fluid tests. This involves using a needle to draw fluid from the joint and examining the fluid under a microscope.  TREATMENT  Goals of treatment are to control pain, improve joint function, maintain a normal body weight, and maintain a healthy lifestyle. Treatment approaches may include:  A prescribed exercise program with rest and joint relief.   Weight control with nutritional education.   Pain relief techniques such as:   Properly applied heat and cold.   Electric pulses delivered to nerve endings under the skin (transcutaneous electrical nerve stimulation, TENS).   Massage.   Certain supplements. Ask your  caregiver before using any supplements, especially in combination with prescribed drugs.   Medicines to control pain, such as:   Acetaminophen.   Nonsteroidal anti-inflammatory drugs (NSAIDs), such as naproxen.   Narcotic or central-acting agents, such as tramadol. This drug carries a risk of addiction and is generally prescribed for short-term use.   Corticosteroids. These can be given orally or as injection. This is a short-term treatment, not recommended for routine use.   Surgery to reposition the bones and relieve pain (osteotomy) or to remove loose pieces of bone and cartilage. Joint replacement may be needed in advanced states of osteoarthritis.  HOME CARE INSTRUCTIONS  Your caregiver can recommend specific types of exercise. These may include:  Strengthening exercises. These are done to strengthen the muscles that support joints affected by arthritis. They can be performed with weights or with exercise bands to add resistance.   Aerobic activities. These are exercises, such as brisk walking or low-impact aerobics, that get your heart pumping. They can help keep your lungs and circulatory system in shape.   Range-of-motion activities. These keep your joints limber.   Balance and agility exercises. These help you maintain daily living skills.  Learning about your condition and being actively involved in your care will help improve the course of your osteoarthritis. SEEK MEDICAL CARE IF:   You feel hot or your skin turns red.   You develop a rash in addition to your joint pain.   You have an oral temperature above 102 F (38.9 C).  FOR   MORE INFORMATION  National Institute of Arthritis and Musculoskeletal and Skin Diseases: www.niams.nih.gov National Institute on Aging: www.nia.nih.gov American College of Rheumatology: www.rheumatology.org Document Released: 05/20/2005 Document Revised: 05/09/2011 Document Reviewed: 08/31/2009 [employer] Patient Information 2012 [employer]C. 

## 2011-08-20 NOTE — ED Notes (Signed)
Pt complains of bilateral knee pain, states he was very active on Friday and knees have been sore since then

## 2011-08-20 NOTE — ED Provider Notes (Signed)
Medical screening examination/treatment/procedure(s) were performed by non-physician practitioner and as supervising physician I was immediately available for consultation/collaboration.   Brookley Spitler M [place], MD 08/20/11 2340 

## 2011-08-20 NOTE — ED Provider Notes (Signed)
History     CSN: [id number]  Arrival date & time 08/20/11  4098   First MD Initiated Contact with Patient 08/20/11 2200      Chief Complaint  Patient presents with  . Knee Pain    (Consider location/radiation/quality/duration/timing/severity/associated sxs/prior treatment) Patient is a 56 y.o. male presenting with knee pain. The history is provided by the patient. No language interpreter was used.  Knee Pain This is a chronic problem. The current episode started in the past 7 days. The problem occurs daily. The problem has been gradually worsening. Associated symptoms include arthralgias. Pertinent negatives include no fatigue, fever, joint swelling, myalgias, swollen glands or weakness. The symptoms are aggravated by walking. He has tried rest and oral narcotics for the symptoms. The treatment provided mild relief.   Patient with history of osteoarthritis presents with bilateral knee pain. Patient states he's been exerting himself with performing heavy lifting and increase walking for the past several days, which has worsened his chronic knee pain. States pain is described as a sharp sensation worsened with walking, or with turning. Pain felt similar to his chronic pain. He denies fever, rash, increase swelling, numbness.  He has been taken Ultram without adequate relief. States that he has an orthopedic doctor, Dr. Eulah Pont, who has recommended total knee replacement.   Past Medical History  Diagnosis Date  . Hypertension   . Depression   . Cancer     squamous cell "all in my face arms and hands"  . Chronic pain   . Headache     Past Surgical History  Procedure Date  . Skin cancer excision   . Left wrist     History reviewed. No pertinent family history.  History  Substance Use Topics  . Smoking status: Current Everyday Smoker    Types: Cigarettes  . Smokeless tobacco: Not on file  . Alcohol Use: No      Review of Systems  Constitutional: Negative for fever and  fatigue.  Musculoskeletal: Positive for arthralgias. Negative for myalgias and joint swelling.  Neurological: Negative for weakness.  All other systems reviewed and are negative.    Allergies  Review of patient's allergies indicates no known allergies.  Home Medications   Current Outpatient Rx  Name Route Sig Dispense Refill  . ACETAMINOPHEN 500 MG PO TABS Oral Take 1,000 mg by mouth every 6 (six) hours as needed. For pain    . CALCIUM CARBONATE 600 MG PO TABS Oral Take 600 mg by mouth daily.    Marland Kitchen FAMOTIDINE 20 MG PO TABS Oral Take 1 tablet (20 mg total) by mouth 2 (two) times daily. 60 tablet 0  . IBUPROFEN 200 MG PO TABS Oral Take 600 mg by mouth once.    . TRAMADOL HCL 50 MG PO TABS Oral Take 1 tablet (50 mg total) by mouth 4 (four) times daily as needed. For pain. Maximum dose= 8 tablets per day 20 tablet 0    BP 159/100  Pulse 65  Temp(Src) 97.6 F (36.4 C) (Oral)  Resp 18  Wt 195 lb (88.451 kg)  SpO2 98%  Physical Exam  Nursing note and vitals reviewed. Constitutional: He appears well-developed and well-nourished. No distress.  HENT:  Head: Atraumatic.  Eyes: Conjunctivae are normal.  Musculoskeletal:       Right hip: Normal.       Left hip: Normal.       Right knee: He exhibits decreased range of motion. He exhibits no swelling, no effusion, no deformity and  normal alignment. tenderness found.       Left knee: He exhibits decreased range of motion. He exhibits no swelling, no effusion, no ecchymosis, no deformity and no LCL laxity. tenderness found.       Lumbar back: Normal.    ED Course  Procedures (including critical care time)  Labs Reviewed - No data to display No results found.   No diagnosis found.    MDM  History of chronic knee pain, with multiple prior ER visits for same. Evidence of osteoarthritis from prior x-ray.  Today's pain is the continuation of his chronic pain. No evidence of infections, low suspicion for fracture patient states he  takes, and all but has not helped. I spent a moderate amount of time discussing the importance of further management by a specialist, or followup with a pain clinic for his persistent pain. I discussed risks and benefits of the narcotic pain medication. I agreed to give patient a short course of pain medication. However I instructed the patient to further management of his chronic pain outside ER setting. Patient was understanding and agrees with plan.       Fayrene Helper, PA-C 08/20/11 2218

## 2011-09-29 ENCOUNTER — Encounter (HOSPITAL_COMMUNITY): Payer: Self-pay | Admitting: *Deleted

## 2011-09-29 ENCOUNTER — Emergency Department (HOSPITAL_COMMUNITY)
Admission: EM | Admit: 2011-09-29 | Discharge: 2011-09-29 | Disposition: A | Discharge status: Home / Self Care | Payer: Medicaid Other | Attending: Emergency Medicine | Admitting: Emergency Medicine

## 2011-09-29 DIAGNOSIS — M25569 Pain in unspecified knee: Secondary | ICD-10-CM | POA: Insufficient documentation

## 2011-09-29 DIAGNOSIS — F17200 Nicotine dependence, unspecified, uncomplicated: Secondary | ICD-10-CM | POA: Insufficient documentation

## 2011-09-29 DIAGNOSIS — I10 Essential (primary) hypertension: Secondary | ICD-10-CM | POA: Insufficient documentation

## 2011-09-29 DIAGNOSIS — G8929 Other chronic pain: Secondary | ICD-10-CM

## 2011-09-29 MED ORDER — HYDROCODONE-ACETAMINOPHEN 5-500 MG PO TABS: 1.0000 | ORAL_TABLET | Freq: Four times a day (QID) | ORAL | Status: AC | PRN

## 2011-09-29 NOTE — ED Provider Notes (Signed)
History     CSN: [id number]  Arrival date & time 09/29/11  1735   First MD Initiated Contact with Patient 09/29/11 1757      Chief Complaint  Patient presents with  . Knee Pain    right    (Consider location/radiation/quality/duration/timing/severity/associated sxs/prior treatment) Patient is a 56 y.o. male presenting with knee pain. The history is provided by the patient.  Knee Pain This is a chronic problem. The current episode started yesterday. The problem occurs constantly. Associated symptoms include arthralgias, joint swelling and weakness. Pertinent negatives include no chills, fever or numbness. The symptoms are aggravated by walking and standing. He has tried NSAIDs for the symptoms.  PT states he has chronic problems with right knee. States he does not have a doctor that he sees. Pain worsened yesterday when he was going up some steps. He denies falling or twisting his knee. Denies new joint swelling, redness. Denise fever, chills. Pain worsened with walking. No other complaints.   Past Medical History  Diagnosis Date  . Hypertension   . Depression   . Cancer     squamous cell "all in my face arms and hands"  . Chronic pain   . Headache     Past Surgical History  Procedure Date  . Skin cancer excision   . Left wrist   . Cartilage removed from both knees     History reviewed. No pertinent family history.  History  Substance Use Topics  . Smoking status: Current Everyday Smoker -- 0.5 packs/day    Types: Cigarettes  . Smokeless tobacco: Never Used  . Alcohol Use: No      Review of Systems  Constitutional: Negative for fever and chills.  Respiratory: Negative.   Cardiovascular: Negative.   Musculoskeletal: Positive for joint swelling and arthralgias.  Skin: Negative.   Neurological: Positive for weakness. Negative for numbness.    Allergies  Review of patient's allergies indicates no known allergies.  Home Medications   Current Outpatient Rx    Name Route Sig Dispense Refill  . ACETAMINOPHEN 500 MG PO TABS Oral Take 1,000 mg by mouth every 6 (six) hours as needed. For pain    . ALUM HYDROXIDE-MAG TRISILICATE 80-14.2 MG PO CHEW Oral Chew by mouth.    . FAMOTIDINE 20 MG PO TABS Oral Take 1 tablet (20 mg total) by mouth 2 (two) times daily. 60 tablet 0  . IBUPROFEN 200 MG PO TABS Oral Take 600 mg by mouth once.    Marland Kitchen NAPROXEN SODIUM 220 MG PO TABS Oral Take 660 mg by mouth daily as needed. Knee pain.      BP 144/92  Pulse 66  Temp(Src) 97.1 F (36.2 C) (Axillary)  Resp 18  SpO2 100%  Physical Exam  Nursing note and vitals reviewed. Constitutional: He is oriented to person, place, and time. He appears well-developed and well-nourished. No distress.  HENT:  Head: Normocephalic.  Neck: Neck supple.  Cardiovascular: Normal rate, regular rhythm and normal heart sounds.   Pulmonary/Chest: Effort normal and breath sounds normal. No respiratory distress. He has no wheezes. He has no rales.  Musculoskeletal: He exhibits no edema and no tenderness.       Normal appearing right knee. NO tenderness with palpation over entire knee. Full ROM. Pain with full flexion, extension. Joint stable with negative anterior and posterior drawer signs. No laxity with medial or lateral stress.  Neurological: He is alert and oriented to person, place, and time.  Skin: Skin is warm and  dry.    ED Course  Procedures (including critical care time)  Pt with chronic knee pain exacerbation. Referred to orthopedics per pt's request. Discussed pt's frequent visits for chronic pain, and advised that he should find a primary care or orthopedics specialist to treat his pain, and to do more testing to figure out why his knee is constantly hurting. Pt agreed. Will d/c home. Based on exam, do not think x-rays or any other imaging are necessary at this time.   1. Chronic knee pain       MDM          Lottie Mussel, PA 09/30/11 0101

## 2011-09-29 NOTE — ED Notes (Signed)
Pt from home with reports of right knee pain that is chronic in nature but has gotten worse since yesterday. Pt reports going up steps yesterday and afterwards pain increased.

## 2011-09-29 NOTE — Discharge Instructions (Signed)
Ice your knee several times a day. Keep it elevated. Continue naprosyn for pain. Vicodin as prescribed as needed for severe pain. Please follow up with orthopedics specialist.   Chronic Pain Management Managing chronic pain is not easy. The goal is to provide as much pain relief as possible. There are emotional as well as physical problems. Chronic pain may lead to symptoms of depression which magnify those of the pain. Problems may include:  Anxiety.   Sleep disturbances.   Confused thinking.   Feeling cranky.   Fatigue.   Weight gain or loss.  Identify the source of the pain first, if possible. The pain may be masking another problem. Try to find a pain management specialist or clinic. Work with a team to create a treatment plan for you. MEDICATIONS  May include narcotics or opioids. Larger than normal doses may be needed to control your pain.   Drugs for depression may help.   Over-the-counter medicines may help for some conditions. These drugs may be used along with others for better pain relief.   May be injected into sites such as the spine and joints. Injections may have to be repeated if they wear off.  THERAPY MAY INCLUDE:  Working with a physical therapist to keep from getting stiff.   Regular, gentle exercise.   Cognitive or behavioral therapy.   Using complementary or integrative medicine such as:   Acupuncture.   Massage, Reiki, or Rolfing.   Aroma, color, light, or sound therapy.   Group support.  FOR MORE INFORMATION [email or web address]. American Chronic Pain Association [email or web address].gl. Document Released: 06/27/2004 Document Revised: 05/09/2011 Document Reviewed: 08/06/2007 [facility] Patient Information 2012 [place], [place].

## 2011-09-29 NOTE — ED Notes (Signed)
Pt in from home with c/o right knee pain states injured one year ago denies recent injury states pain increased in the knee while climbing stairs pt states heard a "pop" states tylenol, motrin, tramadol was ineffective pt is a/o x3 no apparent distress at time

## 2011-09-30 NOTE — ED Provider Notes (Signed)
Medical screening examination/treatment/procedure(s) were performed by non-physician practitioner and as supervising physician I was immediately available for consultation/collaboration.   Lyanne Co, MD 09/30/11 1118

## 2012-01-03 ENCOUNTER — Ambulatory Visit: Payer: Medicare Other

## 2012-01-03 ENCOUNTER — Ambulatory Visit (INDEPENDENT_AMBULATORY_CARE_PROVIDER_SITE_OTHER): Payer: Medicare Other | Admitting: Family Medicine

## 2012-01-03 VITALS — BP 150/100 | HR 81 | Temp 97.9°F | Resp 22 | Ht 68.5 in | Wt 192.2 lb

## 2012-01-03 DIAGNOSIS — I10 Essential (primary) hypertension: Secondary | ICD-10-CM

## 2012-01-03 DIAGNOSIS — M549 Dorsalgia, unspecified: Secondary | ICD-10-CM

## 2012-01-03 DIAGNOSIS — M25569 Pain in unspecified knee: Secondary | ICD-10-CM

## 2012-01-03 DIAGNOSIS — M1990 Unspecified osteoarthritis, unspecified site: Secondary | ICD-10-CM

## 2012-01-03 DIAGNOSIS — M170 Bilateral primary osteoarthritis of knee: Secondary | ICD-10-CM

## 2012-01-03 MED ORDER — TRAMADOL HCL 50 MG PO TABS: ORAL_TABLET | ORAL | Status: DC

## 2012-01-03 MED ORDER — OXAPROZIN 600 MG PO TABS: ORAL_TABLET | ORAL | Status: DC

## 2012-01-03 MED ORDER — LISINOPRIL-HYDROCHLOROTHIAZIDE 20-12.5 MG PO TABS: 1.0000 | ORAL_TABLET | Freq: Every day | ORAL | Status: DC

## 2012-01-03 NOTE — Patient Instructions (Addendum)
Continue to stay as active as possible.  Take medication as ordered for blood pressure and for your knees and back.  We are making a referral to the knee specialist for you. If you do not hear back from [place] within a week: Here and find out about what happened to the referral.

## 2012-01-03 NOTE — Progress Notes (Signed)
Subjective: 56 year old man who just got his Medicare today so he came on in to get checked. He wanted to be seen sooner but could not forward. Some years ago he fell off a horse and hurt his back. He has chronic low back pain ever since then. He has had progressively worse worsening knee pain. He's been to the emergency room for a number of times and had some pain medicines. He currently just takes OTC Motrin or Tylenol. He says he has a history of PTSD. 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 He works for the city for many years. He says he needs an orthopedic doctor for his knees.  Objective: White male in mild discomfort. No CVA tenderness. Has some pain of the and tenderness of the lower lumbar spine. Flexion extension and lateral tilt is fair but he has pain especially lateral motion. Straight leg raise test is negative. He said he had sciatica into the left leg. Gait is a little abnormal from the pain. Knees have some crepitance mildly bilaterally.  Assessment: Low back pain History of left lumbar radiculopathy DJD of knees Hypertension  Plan: X-ray of knees and lumbar spine  [facility] reading (PRIMARY) by  Dr. Alwyn Ren Bilateral medial he joint space loss consistent with DJD of the knees. Essentially normal lumbar spine for age.  Marland Kitchen

## 2012-01-17 ENCOUNTER — Emergency Department (HOSPITAL_COMMUNITY)
Admission: EM | Admit: 2012-01-17 | Discharge: 2012-01-17 | Disposition: A | Discharge status: Home / Self Care | Payer: Medicare Other | Attending: Emergency Medicine | Admitting: Emergency Medicine

## 2012-01-17 DIAGNOSIS — R51 Headache: Secondary | ICD-10-CM | POA: Insufficient documentation

## 2012-01-17 DIAGNOSIS — R229 Localized swelling, mass and lump, unspecified: Secondary | ICD-10-CM | POA: Insufficient documentation

## 2012-01-17 DIAGNOSIS — I10 Essential (primary) hypertension: Secondary | ICD-10-CM | POA: Insufficient documentation

## 2012-01-17 DIAGNOSIS — Z79899 Other long term (current) drug therapy: Secondary | ICD-10-CM | POA: Insufficient documentation

## 2012-01-17 LAB — POCT I-STAT, CHEM 8
Chloride: 105 mEq/L (ref 96–112)
Creatinine, Ser: 0.9 mg/dL (ref 0.50–1.35)
Glucose, Bld: 83 mg/dL (ref 70–99)
HCT: 48 % (ref 39.0–52.0)
Hemoglobin: 16.3 g/dL (ref 13.0–17.0)
Potassium: 3.9 mEq/L (ref 3.5–5.1)
Sodium: 143 mEq/L (ref 135–145)

## 2012-01-17 MED ORDER — FENTANYL CITRATE 0.05 MG/ML IJ SOLN
100.0000 ug | Freq: Once | INTRAMUSCULAR | Status: AC
  Administered 2012-01-17: 100 ug via INTRAVENOUS
  Filled 2012-01-17: qty 2

## 2012-01-17 MED ORDER — HYDROCHLOROTHIAZIDE 12.5 MG PO TABS: 12.5000 mg | ORAL_TABLET | Freq: Every day | ORAL | Status: DC

## 2012-01-17 MED ORDER — KETOROLAC TROMETHAMINE 30 MG/ML IJ SOLN
30.0000 mg | Freq: Once | INTRAMUSCULAR | Status: AC
  Administered 2012-01-17: 30 mg via INTRAVENOUS
  Filled 2012-01-17: qty 1

## 2012-01-17 MED ORDER — LISINOPRIL 20 MG PO TABS: 20.0000 mg | ORAL_TABLET | Freq: Every day | ORAL | Status: DC

## 2012-01-17 MED ORDER — PROMETHAZINE HCL 25 MG/ML IJ SOLN
25.0000 mg | Freq: Once | INTRAMUSCULAR | Status: AC
  Administered 2012-01-17: 25 mg via INTRAVENOUS
  Filled 2012-01-17: qty 1

## 2012-01-17 NOTE — ED Notes (Addendum)
Per EMS, pt lives at the [facility].  Pt c/o pain to LT frontal area of head x 24 hrs w/hx brain cancer in same area w/mets to chest and LT arm. Denies n/v.  Has neg sx's for CVA.  Wife reports swollen groin lymph node.

## 2012-01-17 NOTE — ED Provider Notes (Signed)
History     CSN: [id number]  Arrival date & time 01/17/12  1756   First MD Initiated Contact with Patient 01/17/12 1809      Chief Complaint  Patient presents with  . LT frontal H/A.     HPI 56 yo male with h/o squamous cell carcinoma, HTN who presents with frontal headache that started yesterday. Headache was dull yesterday and this morning and more severe this afternoon. It is achy and constant. He took 2 aspirin yesterday for pain which did not help much. He ran out of blood pressure medications 3 days ago and checked his BP yesterday which was elevated at 156/98. He found blood 2 blood pressure pills this morning which he took. Headache is not associated with nausea, vomiting, change in vision or weakness.Denies any nasal congestion, rhinorrhea or recent allergies.  He says he gets headaches like this when his blood pressure is high.  Also complains of right knee pain that is worst with touching since this morning. He has chronic knee pain but this is different. No weakness in leg or difficulty walking or bending or extending knee.  Past Medical History  Diagnosis Date  . Hypertension   . Depression   . Cancer     squamous cell "all in my face arms and hands"  . Chronic pain   . Headache     Past Surgical History  Procedure Date  . Skin cancer excision   . Left wrist   . Cartilage removed from both knees     No family history on file.  History  Substance Use Topics  . Smoking status: Current Everyday Smoker -- 0.5 packs/day    Types: Cigarettes  . Smokeless tobacco: Never Used  . Alcohol Use: No     Review of Systems  All other systems reviewed and are negative.   Allergies  Review of patient's allergies indicates no known allergies.  Home Medications   Current Outpatient Rx  Name Route Sig Dispense Refill  . IBUPROFEN 200 MG PO TABS Oral Take 600 mg by mouth once.    Marland Kitchen LISINOPRIL-HYDROCHLOROTHIAZIDE 20-12.5 MG PO TABS Oral Take 1 tablet by mouth daily.      . OXAPROZIN 600 MG PO TABS  Take one twice daily with food for arthritis    . RANITIDINE HCL 150 MG PO TABS Oral Take 150 mg by mouth 2 (two) times daily.    . TRAMADOL HCL 50 MG PO TABS  One pill twice a day when having severe pain not relieved by the oxalprozin    . HYDROCHLOROTHIAZIDE 12.5 MG PO TABS Oral Take 1 tablet (12.5 mg total) by mouth daily. 30 tablet 3  . LISINOPRIL 20 MG PO TABS Oral Take 1 tablet (20 mg total) by mouth daily. 30 tablet 3    BP 141/91  Pulse 59  Temp 98.7 F (37.1 C) (Oral)  Resp 20  SpO2 99%  Physical Exam  Constitutional:       No acute distress, conversant   HENT:       No erythema or congested in nasal turbinates. No maxillary sinus tenderness. No frontal or temporal tenderness.   Eyes: Conjunctivae and EOM are normal. Pupils are equal, round, and reactive to light. Right eye exhibits no discharge.  Neck: Normal range of motion. Neck supple.  Cardiovascular: Normal rate, regular rhythm and normal heart sounds.   No murmur heard. Pulmonary/Chest: Effort normal and breath sounds normal. No respiratory distress.  Abdominal: Soft. Bowel sounds are  normal. He exhibits no distension.  Musculoskeletal:       Small tender nodule below right knee medially.   Neurological:       CN2-12 grossly intact. 5/5 strength in upper and lower extremities.   Skin: Skin is warm and dry.    ED Course  Procedures (including critical care time)  Labs Reviewed  POCT I-STAT, CHEM 8 - Abnormal; Notable for the following:    BUN 4 (*)     Calcium, Ion 1.24 (*)     All other components within normal limits   No results found.   1. Headache   2. Hypertension     MDM  Headache could be from hypertension vs migraine. No focal neuro finding. Upon review of chart, patient does have multiple visits for headache.  BMP shows normal creatinine. Will give toradol 30mg  IM and phenergan 12.5mg  as headache cocktail.  Pain still at 9/10 after migraine cocktail. Gave  fentanyl x1 with improvement of headache.  Blood pressure improved on discharge at 147 systolic. Patient on lisinopril/hctz 20/12.5 at home. Gave Rx for $4 Rx at drug store.  Knee pain: small cyst present on exam.  Patient to continue with analgesia at home and follow up with PCP about this.  Patient discharged home in stable medical condition with red flags for return to the ED.   Don [name], PGY-2 [facility]             Don Skinner, MD 01/17/12 [phone]

## 2012-01-17 NOTE — ED Notes (Addendum)
Pt states he hasn't been able to get his BP meds x 3 days b/c he has no money to get it.  States his BP was elevated 156/98 last night and he started to get a h/a at that point. He had taken some aspirin then and claritin x 2 today.  States he found 2 BP pills in the drawer this am and took them.

## 2012-01-17 NOTE — ED Notes (Signed)
MD at bedside. 

## 2012-01-19 NOTE — ED Provider Notes (Signed)
I saw and evaluated the patient, reviewed the resident's note and I agree with the findings and plan.   .Face to face Exam:  General:  Awake HEENT:  Atraumatic Resp:  Normal effort Abd:  Nondistended Neuro:No focal weakness Lymph: No adenopathy   Eames Dibiasio L Darlina Mccaughey, MD 01/19/12 0456 

## 2012-02-02 ENCOUNTER — Emergency Department (HOSPITAL_BASED_OUTPATIENT_CLINIC_OR_DEPARTMENT_OTHER)
Admission: EM | Admit: 2012-02-02 | Discharge: 2012-02-02 | Disposition: A | Discharge status: Home / Self Care | Payer: Medicare Other | Attending: Emergency Medicine | Admitting: Emergency Medicine

## 2012-02-02 ENCOUNTER — Encounter (HOSPITAL_BASED_OUTPATIENT_CLINIC_OR_DEPARTMENT_OTHER): Payer: Self-pay | Admitting: *Deleted

## 2012-02-02 DIAGNOSIS — M1990 Unspecified osteoarthritis, unspecified site: Secondary | ICD-10-CM

## 2012-02-02 DIAGNOSIS — IMO0002 Reserved for concepts with insufficient information to code with codable children: Secondary | ICD-10-CM | POA: Insufficient documentation

## 2012-02-02 DIAGNOSIS — L0291 Cutaneous abscess, unspecified: Secondary | ICD-10-CM

## 2012-02-02 DIAGNOSIS — M1710 Unilateral primary osteoarthritis, unspecified knee: Secondary | ICD-10-CM | POA: Insufficient documentation

## 2012-02-02 MED ORDER — SULFAMETHOXAZOLE-TRIMETHOPRIM 800-160 MG PO TABS: 1.0000 | ORAL_TABLET | Freq: Two times a day (BID) | ORAL | Status: AC

## 2012-02-02 MED ORDER — HYDROCODONE-ACETAMINOPHEN 5-325 MG PO TABS: 2.0000 | ORAL_TABLET | ORAL | Status: AC | PRN

## 2012-02-02 MED ORDER — IBUPROFEN 800 MG PO TABS
800.0000 mg | ORAL_TABLET | Freq: Once | ORAL | Status: AC
  Administered 2012-02-02: 800 mg via ORAL
  Filled 2012-02-02: qty 1

## 2012-02-02 MED ORDER — LIDOCAINE HCL 2 % IJ SOLN
INTRAMUSCULAR | Status: AC
  Filled 2012-02-02: qty 1

## 2012-02-02 NOTE — ED Provider Notes (Signed)
History     CSN: [id number]  Arrival date & time 02/02/12  4098   First MD Initiated Contact with Patient 02/02/12 [phone]      Chief Complaint  Patient presents with  . Abscess    (Consider location/radiation/quality/duration/timing/severity/associated sxs/prior treatment) HPI Comments: Patient with a worsening abscess to his left axilla. He also complains of some ongoing pain to his left knee. This is her chronic symptom for which she sees an orthopedist. He's been told that he needs total knee replacement. Denies any change in symptoms he just requests prescription for some pain medicine for his knee.  Patient is a 56 y.o. male presenting with abscess. The history is provided by the patient.  Abscess  This is a new problem. Episode onset: 3 days. The onset was gradual. The problem occurs continuously. The problem has been gradually worsening. Affected Location: Left axilla. The problem is moderate. The abscess is characterized by redness, painfulness and burning. The abscess first occurred at home. Pertinent negatives include no fever and no vomiting.    Past Medical History  Diagnosis Date  . Hypertension   . Depression   . Cancer     squamous cell "all in my face arms and hands"  . Chronic pain   . Headache     Past Surgical History  Procedure Date  . Skin cancer excision   . Left wrist   . Cartilage removed from both knees     No family history on file.  History  Substance Use Topics  . Smoking status: Current Everyday Smoker -- 0.5 packs/day    Types: Cigarettes  . Smokeless tobacco: Never Used  . Alcohol Use: No      Review of Systems  Constitutional: Negative for fever.  HENT: Negative for neck pain.   Respiratory: Negative.   Cardiovascular: Negative.   Gastrointestinal: Negative for nausea and vomiting.  Musculoskeletal: Positive for arthralgias. Negative for back pain and joint swelling.  Skin: Positive for wound.  Neurological: Negative for  weakness, numbness and headaches.    Allergies  Review of patient's allergies indicates no known allergies.  Home Medications   Current Outpatient Rx  Name Route Sig Dispense Refill  . KLONOPIN PO Oral Take by mouth 4 (four) times daily.    Marland Kitchen HYDROCHLOROTHIAZIDE 12.5 MG PO TABS Oral Take 1 tablet (12.5 mg total) by mouth daily. 30 tablet 3  . HYDROCODONE-ACETAMINOPHEN 5-325 MG PO TABS Oral Take 2 tablets by mouth every 4 (four) hours as needed for pain. 20 tablet 0  . IBUPROFEN 200 MG PO TABS Oral Take 600 mg by mouth once.    Marland Kitchen LISINOPRIL 20 MG PO TABS Oral Take 1 tablet (20 mg total) by mouth daily. 30 tablet 3  . LISINOPRIL-HYDROCHLOROTHIAZIDE 20-12.5 MG PO TABS Oral Take 1 tablet by mouth daily.    . OXAPROZIN 600 MG PO TABS  Take one twice daily with food for arthritis    . RANITIDINE HCL 150 MG PO TABS Oral Take 150 mg by mouth 2 (two) times daily.    . SULFAMETHOXAZOLE-TRIMETHOPRIM 800-160 MG PO TABS Oral Take 1 tablet by mouth every 12 (twelve) hours. 20 tablet 0  . TRAMADOL HCL 50 MG PO TABS  One pill twice a day when having severe pain not relieved by the oxalprozin      BP 144/86  Pulse 71  Temp 97.8 F (36.6 C) (Oral)  Resp 16  SpO2 100%  Physical Exam  Constitutional: He is oriented to person,  place, and time. He appears well-developed and well-nourished.  HENT:  Head: Normocephalic and atraumatic.  Eyes: Pupils are equal, round, and reactive to light.  Neck: Normal range of motion. Neck supple.  Cardiovascular: Normal rate, regular rhythm and normal heart sounds.   Pulmonary/Chest: Effort normal and breath sounds normal. No respiratory distress. He has no wheezes. He has no rales. He exhibits no tenderness.  Abdominal: Soft. Bowel sounds are normal. There is no tenderness. There is no rebound and no guarding.  Musculoskeletal: Normal range of motion. He exhibits no edema.       He has tenderness on range of motion of both knees. There is no joint effusion noted.  No redness or warmth is noted.  Lymphadenopathy:    He has no cervical adenopathy.  Neurological: He is alert and oriented to person, place, and time.  Skin: Skin is warm and dry. No rash noted.       3 cm fluctuant abscess to the left axilla. There is minimal amount of surrounding erythema.  Psychiatric: He has a normal mood and affect.    ED Course  INCISION AND DRAINAGE Date/Time: 02/02/2012 9:36 AM Performed by: Shatira Dobosz Authorized by: Rolan Bucco Consent: Verbal consent obtained. Risks and benefits: risks, benefits and alternatives were discussed Consent given by: patient Type: abscess Location: Left axilla. Anesthesia: local infiltration Local anesthetic: lidocaine 1% without epinephrine Anesthetic total: 2 ml Patient sedated: no Scalpel size: 11 Incision type: single straight Complexity: simple Drainage: purulent Drainage amount: moderate Wound treatment: wound left open Packing material: 1/4 in iodoform gauze Patient tolerance: Patient tolerated the procedure well with no immediate complications.   (including critical care time)  Labs Reviewed - No data to display No results found.   1. Abscess   2. DJD (degenerative joint disease)       MDM  Abscess was I&D. I will give him a prescription for Bactrim as well as Vicodin for pain. He has appointment to followup on Tuesday with his orthopedist. I also advised him return here in 2 days for wound check and packing removal        Rolan Bucco, MD 02/02/12 [phone]

## 2012-02-02 NOTE — ED Notes (Signed)
Patient reports abscess under L arm, red, not draining, tender to touch, has grown worse over the past three days.. Also C.O L knee pain, states his MD informed him of the need to perform knee surgery

## 2012-02-25 ENCOUNTER — Ambulatory Visit: Payer: Medicare Other

## 2012-11-04 ENCOUNTER — Encounter (HOSPITAL_COMMUNITY): Payer: Self-pay | Admitting: Emergency Medicine

## 2012-11-04 ENCOUNTER — Emergency Department (HOSPITAL_COMMUNITY)
Admission: EM | Admit: 2012-11-04 | Discharge: 2012-11-04 | Disposition: A | Discharge status: Home / Self Care | Payer: Medicare Other | Attending: Emergency Medicine | Admitting: Emergency Medicine

## 2012-11-04 DIAGNOSIS — L02411 Cutaneous abscess of right axilla: Secondary | ICD-10-CM

## 2012-11-04 DIAGNOSIS — F3289 Other specified depressive episodes: Secondary | ICD-10-CM | POA: Insufficient documentation

## 2012-11-04 DIAGNOSIS — I10 Essential (primary) hypertension: Secondary | ICD-10-CM | POA: Insufficient documentation

## 2012-11-04 DIAGNOSIS — Z79899 Other long term (current) drug therapy: Secondary | ICD-10-CM | POA: Insufficient documentation

## 2012-11-04 DIAGNOSIS — Z8739 Personal history of other diseases of the musculoskeletal system and connective tissue: Secondary | ICD-10-CM | POA: Insufficient documentation

## 2012-11-04 DIAGNOSIS — F329 Major depressive disorder, single episode, unspecified: Secondary | ICD-10-CM | POA: Insufficient documentation

## 2012-11-04 DIAGNOSIS — Z8589 Personal history of malignant neoplasm of other organs and systems: Secondary | ICD-10-CM | POA: Insufficient documentation

## 2012-11-04 DIAGNOSIS — IMO0002 Reserved for concepts with insufficient information to code with codable children: Secondary | ICD-10-CM | POA: Insufficient documentation

## 2012-11-04 DIAGNOSIS — R509 Fever, unspecified: Secondary | ICD-10-CM | POA: Insufficient documentation

## 2012-11-04 DIAGNOSIS — R110 Nausea: Secondary | ICD-10-CM | POA: Insufficient documentation

## 2012-11-04 DIAGNOSIS — F17200 Nicotine dependence, unspecified, uncomplicated: Secondary | ICD-10-CM | POA: Insufficient documentation

## 2012-11-04 HISTORY — DX: Unspecified osteoarthritis, unspecified site: M19.90

## 2012-11-04 MED ORDER — OXYCODONE-ACETAMINOPHEN 5-325 MG PO TABS: 1.0000 | ORAL_TABLET | Freq: Four times a day (QID) | ORAL | Status: DC | PRN

## 2012-11-04 NOTE — ED Provider Notes (Signed)
Medical screening examination/treatment/procedure(s) were performed by non-physician practitioner and as supervising physician I was immediately available for consultation/collaboration.    Celene Kras, MD 11/04/12 [phone]

## 2012-11-04 NOTE — ED Provider Notes (Signed)
History    This chart was scribed for non-physician practitioner Don [name] working with Celene Kras, MD by Quintella Reichert, ED Scribe. This patient was seen in room WA20/WA20 and the patient's care was started at 6:16 PM .   CSN: [id number]  Arrival date & time 11/04/12  1748      Chief Complaint  Patient presents with  . Abscess    3 day hx of abscess under r/arm. 2 cm diameter-red raised area     The history is provided by the patient. No language interpreter was used.    HPI Comments: Don [name] is a 57 y.o. male who presents to the Emergency Department complaining of a progressively-worsening abscess to the right axilla that began 3 days ago, with associated moderate pain that is exacerbated by touching the area.  He states that he has kept the area clean and attempted to treat pain with hot showers but has not administered any other treatment.  He also reports subjective fever and nausea.  He did not take his temperature.  On admission pt's temperature is 97.9 F per triage notes.  He denies emesis.  Pt had a similar abscess to the left axilla several months ago that was successfully relieved by an I&D in the ED on 02/02/12.  Pt also admits to h/o skin cancer but states cancer is under control to his knowledge.  He also notes that he normally medicates with percocet for chronic pain and has recently run out.    Past Medical History  Diagnosis Date  . Hypertension   . Depression   . Cancer     squamous cell "all in my face arms and hands"  . Chronic pain   . Headache(784.0)   . Arthritis     Past Surgical History  Procedure Laterality Date  . Skin cancer excision    . Left wrist    . Cartilage removed from both knees      Family History  Problem Relation Age of Onset  . Cancer Father   . Diabetes Father   . Hypertension Father     History  Substance Use Topics  . Smoking status: Current Some Day Smoker -- 0.50 packs/day    Types: Cigarettes  .  Smokeless tobacco: Never Used  . Alcohol Use: No      Review of Systems  Constitutional: Positive for fever (Subjective, did not take temperature).  Gastrointestinal: Positive for nausea. Negative for vomiting.  Skin: Negative for color change.       Abscess  Hematological: Negative for adenopathy.    Allergies  Review of patient's allergies indicates no known allergies.  Home Medications   Current Outpatient Rx  Name  Route  Sig  Dispense  Refill  . ClonazePAM (KLONOPIN PO)   Oral   Take by mouth 4 (four) times daily.         . hydrochlorothiazide (HYDRODIURIL) 12.5 MG tablet   Oral   Take 1 tablet (12.5 mg total) by mouth daily.   30 tablet   3   . ibuprofen (ADVIL,MOTRIN) 200 MG tablet   Oral   Take 600 mg by mouth once.         Marland Kitchen lisinopril (PRINIVIL,ZESTRIL) 20 MG tablet   Oral   Take 1 tablet (20 mg total) by mouth daily.   30 tablet   3   . lisinopril-hydrochlorothiazide (PRINZIDE,ZESTORETIC) 20-12.5 MG per tablet   Oral   Take 1 tablet by mouth daily.         Marland Kitchen  oxaprozin (DAYPRO) 600 MG tablet      Take one twice daily with food for arthritis         . ranitidine (ZANTAC) 150 MG tablet   Oral   Take 150 mg by mouth 2 (two) times daily.         . traMADol (ULTRAM) 50 MG tablet      One pill twice a day when having severe pain not relieved by the oxalprozin           BP 145/86  Pulse 75  Temp(Src) 97.9 F (36.6 C) (Oral)  Resp 20  Wt 190 lb (86.183 kg)  BMI 28.47 kg/m2  SpO2 97%  Physical Exam  Nursing note and vitals reviewed. Constitutional: He appears well-developed and well-nourished. No distress.  HENT:  Head: Normocephalic and atraumatic.  Eyes: Conjunctivae and EOM are normal.  Neck: Normal range of motion. Neck supple. No tracheal deviation present.  Cardiovascular: Normal rate.   Pulmonary/Chest: Effort normal. No respiratory distress.  Musculoskeletal: Normal range of motion.  Neurological: He is alert.  Skin:  Skin is warm and dry.  3-cm by 2-cm area of induration in the right axilla with mild overlying induration  Psychiatric: He has a normal mood and affect. His behavior is normal.    ED Course  Procedures (including critical care time)  DIAGNOSTIC STUDIES: Oxygen Saturation is 97% on room air, normal by my interpretation.    COORDINATION OF CARE: 6:20 PM-Discussed treatment plan which includes incision & drainage with pt at bedside and pt agreed to plan.    INCISION AND DRAINAGE PROCEDURE NOTE: Patient identification was confirmed and verbal consent was obtained. This procedure was performed by Don [name] at 6:22 PM. Site: Right axilla Sterile procedures observed Needle size: 25-gauge Anesthetic used (type and amt): 3 cc 1% lidocaine w/ epinephrine Blade size: #11 Drainage: Moderate amount  Complexity: Complex Packing used: None Site anesthetized, incision made over site, wound drained and explored loculations, covered with dry, sterile dressing.  Pt tolerated procedure well without complications.  Instructions for care discussed verbally and pt provided with additional written instructions for homecare and f/u.     Labs Reviewed - No data to display No results found.   1. Abscess of axilla, right     6:47 PM Patient seen and examined. Work-up initiated. Medications ordered.   Vital signs reviewed and are as follows: Filed Vitals:   11/04/12 1804  BP: 145/86  Pulse: 75  Temp: 97.9 F (36.6 C)  Resp: 20   The patient was urged to return to the Emergency Department urgently with worsening pain, swelling, expanding erythema especially if it streaks away from the affected area, fever, or if they have any other concerns.   The patient was instructed to remove packing at home in 48 hours and return to the Emergency Department or go to their PCP at that time for a recheck if their symptoms are not entirely resolved.  The patient verbalized understanding and stated  agreement with this plan.   Patient counseled on use of narcotic pain medications. Counseled not to combine these medications with others containing tylenol. Urged not to drink alcohol, drive, or perform any other activities that requires focus while taking these medications. The patient verbalizes understanding and agrees with the plan.      MDM  Patient with skin abscess amenable to incision and drainage.  o signs of cellulitis is surrounding skin.  Will d/c to home.  No antibiotic therapy is indicated.  I personally performed the services described in this documentation, which was scribed in my presence. The recorded information has been reviewed and is accurate.    Renne Crigler, PA-C 11/04/12 1849

## 2012-11-04 NOTE — ED Notes (Signed)
Pt reports pain and tenderness under red raised area, r/axilla

## 2012-11-08 ENCOUNTER — Emergency Department (HOSPITAL_BASED_OUTPATIENT_CLINIC_OR_DEPARTMENT_OTHER)
Admission: EM | Admit: 2012-11-08 | Discharge: 2012-11-08 | Disposition: A | Discharge status: Home / Self Care | Payer: Medicare Other | Attending: Emergency Medicine | Admitting: Emergency Medicine

## 2012-11-08 ENCOUNTER — Encounter (HOSPITAL_BASED_OUTPATIENT_CLINIC_OR_DEPARTMENT_OTHER): Payer: Self-pay | Admitting: *Deleted

## 2012-11-08 DIAGNOSIS — Z79899 Other long term (current) drug therapy: Secondary | ICD-10-CM | POA: Insufficient documentation

## 2012-11-08 DIAGNOSIS — L0291 Cutaneous abscess, unspecified: Secondary | ICD-10-CM

## 2012-11-08 DIAGNOSIS — M129 Arthropathy, unspecified: Secondary | ICD-10-CM | POA: Insufficient documentation

## 2012-11-08 DIAGNOSIS — Z8659 Personal history of other mental and behavioral disorders: Secondary | ICD-10-CM | POA: Insufficient documentation

## 2012-11-08 DIAGNOSIS — Z85828 Personal history of other malignant neoplasm of skin: Secondary | ICD-10-CM | POA: Insufficient documentation

## 2012-11-08 DIAGNOSIS — F17200 Nicotine dependence, unspecified, uncomplicated: Secondary | ICD-10-CM | POA: Insufficient documentation

## 2012-11-08 DIAGNOSIS — I10 Essential (primary) hypertension: Secondary | ICD-10-CM | POA: Insufficient documentation

## 2012-11-08 DIAGNOSIS — IMO0002 Reserved for concepts with insufficient information to code with codable children: Secondary | ICD-10-CM | POA: Insufficient documentation

## 2012-11-08 DIAGNOSIS — G8929 Other chronic pain: Secondary | ICD-10-CM | POA: Insufficient documentation

## 2012-11-08 MED ORDER — SULFAMETHOXAZOLE-TMP DS 800-160 MG PO TABS
1.0000 | ORAL_TABLET | Freq: Once | ORAL | Status: AC
  Administered 2012-11-08: 1 via ORAL
  Filled 2012-11-08: qty 1

## 2012-11-08 MED ORDER — OXYCODONE-ACETAMINOPHEN 5-325 MG PO TABS: 1.0000 | ORAL_TABLET | ORAL | Status: DC | PRN

## 2012-11-08 MED ORDER — OXYCODONE-ACETAMINOPHEN 5-325 MG PO TABS
2.0000 | ORAL_TABLET | Freq: Once | ORAL | Status: AC
  Administered 2012-11-08: 2 via ORAL
  Filled 2012-11-08 (×2): qty 2

## 2012-11-08 MED ORDER — SULFAMETHOXAZOLE-TRIMETHOPRIM 800-160 MG PO TABS: 1.0000 | ORAL_TABLET | Freq: Two times a day (BID) | ORAL | Status: DC

## 2012-11-08 NOTE — ED Notes (Signed)
States that had an abcess opened up under his right arm at [facility] a few days ago and now has two areas under his left arm. He states that he doesn't think the first abcess was treated by the dr properly and attributes the new areas to that.

## 2012-11-08 NOTE — ED Provider Notes (Signed)
History     CSN: [id number]  Arrival date & time 11/08/12  1102   First MD Initiated Contact with Patient 11/08/12 1116      Chief Complaint  Patient presents with  . Skin Ulcer    (Consider location/radiation/quality/duration/timing/severity/associated sxs/prior treatment) Patient is a 57 y.o. male presenting with abscess. The history is provided by the patient.  Abscess Location:  Shoulder/arm Shoulder/arm abscess location:  L axilla and R axilla Abscess quality: draining, fluctuance, induration, painful, redness and warmth   Red streaking: no   Progression:  Worsening Pain details:    Quality:  Sharp   Timing:  Constant   Progression:  Worsening Chronicity:  Recurrent Context: not diabetes and not immunosuppression   Context comment:  Patient had right axilla drained at [facility] two days ago but now with swelling left axilla.  Relieved by:  Nothing Worsened by:  Nothing tried Associated symptoms: no anorexia, no fatigue, no fever, no nausea and no vomiting   Risk factors: prior abscess     Past Medical History  Diagnosis Date  . Hypertension   . Depression   . Cancer     squamous cell "all in my face arms and hands"  . Chronic pain   . Headache(784.0)   . Arthritis     Past Surgical History  Procedure Laterality Date  . Skin cancer excision    . Left wrist    . Cartilage removed from both knees      Family History  Problem Relation Age of Onset  . Cancer Father   . Diabetes Father   . Hypertension Father     History  Substance Use Topics  . Smoking status: Current Some Day Smoker -- 0.50 packs/day    Types: Cigarettes  . Smokeless tobacco: Never Used  . Alcohol Use: No      Review of Systems  Constitutional: Negative for fever and fatigue.  Gastrointestinal: Negative for nausea, vomiting and anorexia.  All other systems reviewed and are negative.    Allergies  Review of patient's allergies indicates no known allergies.  Home Medications    Current Outpatient Rx  Name  Route  Sig  Dispense  Refill  . clonazePAM (KLONOPIN) 1 MG tablet   Oral   Take 1 mg by mouth 2 (two) times daily as needed for anxiety.         Marland Kitchen diltiazem (CARDIZEM) 120 MG tablet   Oral   Take 120 mg by mouth daily.         Marland Kitchen oxyCODONE-acetaminophen (PERCOCET) 10-325 MG per tablet   Oral   Take 1 tablet by mouth every 8 (eight) hours as needed for pain.          Marland Kitchen oxyCODONE-acetaminophen (PERCOCET/ROXICET) 5-325 MG per tablet   Oral   Take 1-2 tablets by mouth every 6 (six) hours as needed for pain.   4 tablet   0   . oxyCODONE-acetaminophen (PERCOCET/ROXICET) 5-325 MG per tablet   Oral   Take 1 tablet by mouth every 4 (four) hours as needed for pain.   12 tablet   0   . sulfamethoxazole-trimethoprim (SEPTRA DS) 800-160 MG per tablet   Oral   Take 1 tablet by mouth every 12 (twelve) hours.   10 tablet   0     BP 142/106  Pulse 86  Temp(Src) 98.2 F (36.8 C) (Oral)  Resp 18  SpO2 99%  Physical Exam  Nursing note and vitals reviewed. Constitutional: He appears well-developed  and well-nourished.  HENT:  Head: Normocephalic and atraumatic.  Eyes: Conjunctivae and EOM are normal. Pupils are equal, round, and reactive to light.  Neck: Normal range of motion. Neck supple.  Cardiovascular: Normal rate, regular rhythm, normal heart sounds and intact distal pulses.   Pulmonary/Chest: Effort normal and breath sounds normal.  Abdominal: Soft. Bowel sounds are normal.  Musculoskeletal: Normal range of motion.  Neurological: He is alert.  Skin:  Axillary swelling with pustule right axilla 2x3 Right axilla with 4x4 cm swelling and 3x2 cm swelling with warmth and fluctuance.   Psychiatric: He has a normal mood and affect.    ED Course  INCISION AND DRAINAGE Date/Time: 11/08/2012 11:56 AM Performed by: Hilario Quarry Authorized by: Hilario Quarry Consent: Verbal consent obtained. Risks and benefits: risks, benefits and  alternatives were discussed Consent given by: patient Patient understanding: patient states understanding of the procedure being performed Patient identity confirmed: verbally with patient Time out: Immediately prior to procedure a "time out" was called to verify the correct patient, procedure, equipment, support staff and site/side marked as required. Type: abscess Body area: upper extremity (right axilla-1 left axilla 2, 3) Anesthesia: local infiltration Local anesthetic: lidocaine 1% with epinephrine Patient sedated: no Scalpel size: 11 Needle gauge: 22 Incision type: single straight Complexity: simple Drainage: purulent Drainage amount: moderate Wound treatment: wound left open Patient tolerance: Patient tolerated the procedure well with no immediate complications.   (including critical care time)  Labs Reviewed - No data to display No results found.   1. Abscess       MDM  Patient with i and d left axilla 2 days ago with continued pain and drainage.  Here with repeat i and d and ct of left axilla x 2.  Patient treated with bactrim given failure of initial i and d and spreading abscess.  Patient advised of need for close follow up and precautions or return given.         Hilario Quarry, MD 11/08/12 1159

## 2012-11-10 ENCOUNTER — Emergency Department (HOSPITAL_BASED_OUTPATIENT_CLINIC_OR_DEPARTMENT_OTHER)
Admission: EM | Admit: 2012-11-10 | Discharge: 2012-11-10 | Disposition: A | Discharge status: Home / Self Care | Payer: Medicare Other | Attending: Emergency Medicine | Admitting: Emergency Medicine

## 2012-11-10 ENCOUNTER — Encounter (HOSPITAL_BASED_OUTPATIENT_CLINIC_OR_DEPARTMENT_OTHER): Payer: Self-pay

## 2012-11-10 DIAGNOSIS — I10 Essential (primary) hypertension: Secondary | ICD-10-CM | POA: Insufficient documentation

## 2012-11-10 DIAGNOSIS — L0291 Cutaneous abscess, unspecified: Secondary | ICD-10-CM

## 2012-11-10 DIAGNOSIS — F3289 Other specified depressive episodes: Secondary | ICD-10-CM | POA: Insufficient documentation

## 2012-11-10 DIAGNOSIS — F17200 Nicotine dependence, unspecified, uncomplicated: Secondary | ICD-10-CM | POA: Insufficient documentation

## 2012-11-10 DIAGNOSIS — Z4801 Encounter for change or removal of surgical wound dressing: Secondary | ICD-10-CM | POA: Insufficient documentation

## 2012-11-10 DIAGNOSIS — F329 Major depressive disorder, single episode, unspecified: Secondary | ICD-10-CM | POA: Insufficient documentation

## 2012-11-10 DIAGNOSIS — Z8739 Personal history of other diseases of the musculoskeletal system and connective tissue: Secondary | ICD-10-CM | POA: Insufficient documentation

## 2012-11-10 DIAGNOSIS — Z8589 Personal history of malignant neoplasm of other organs and systems: Secondary | ICD-10-CM | POA: Insufficient documentation

## 2012-11-10 DIAGNOSIS — Z79899 Other long term (current) drug therapy: Secondary | ICD-10-CM | POA: Insufficient documentation

## 2012-11-10 NOTE — ED Provider Notes (Signed)
History     CSN: [id number]  Arrival date & time 11/10/12  1556   First MD Initiated Contact with Patient 11/10/12 1558      Chief Complaint  Patient presents with  . Wound Check    (Consider location/radiation/quality/duration/timing/severity/associated sxs/prior treatment) HPI Comments: Pt states that he was seen 2 days ago and had I&D of abscess and he is here for a recheck and see if he can get a medication refill  Patient is a 57 y.o. male presenting with wound check. The history is provided by the patient. No language interpreter was used.  Wound Check This is a recurrent problem. The current episode started in the past 7 days. The problem occurs constantly. The problem has been gradually improving. Nothing aggravates the symptoms. He has tried oral narcotics (antibiotics) for the symptoms. The treatment provided moderate relief.    Past Medical History  Diagnosis Date  . Hypertension   . Depression   . Cancer     squamous cell "all in my face arms and hands"  . Chronic pain   . Headache(784.0)   . Arthritis     Past Surgical History  Procedure Laterality Date  . Skin cancer excision    . Left wrist    . Cartilage removed from both knees      Family History  Problem Relation Age of Onset  . Cancer Father   . Diabetes Father   . Hypertension Father     History  Substance Use Topics  . Smoking status: Current Some Day Smoker -- 0.50 packs/day    Types: Cigarettes  . Smokeless tobacco: Never Used  . Alcohol Use: No      Review of Systems  Constitutional: Negative.   Respiratory: Negative.   Cardiovascular: Negative.     Allergies  Review of patient's allergies indicates no known allergies.  Home Medications   Current Outpatient Rx  Name  Route  Sig  Dispense  Refill  . clonazePAM (KLONOPIN) 1 MG tablet   Oral   Take 1 mg by mouth 2 (two) times daily as needed for anxiety.         Marland Kitchen diltiazem (CARDIZEM) 120 MG tablet   Oral   Take 120 mg  by mouth daily.         Marland Kitchen oxyCODONE-acetaminophen (PERCOCET) 10-325 MG per tablet   Oral   Take 1 tablet by mouth every 8 (eight) hours as needed for pain.          Marland Kitchen oxyCODONE-acetaminophen (PERCOCET/ROXICET) 5-325 MG per tablet   Oral   Take 1-2 tablets by mouth every 6 (six) hours as needed for pain.   4 tablet   0   . oxyCODONE-acetaminophen (PERCOCET/ROXICET) 5-325 MG per tablet   Oral   Take 1 tablet by mouth every 4 (four) hours as needed for pain.   12 tablet   0   . sulfamethoxazole-trimethoprim (SEPTRA DS) 800-160 MG per tablet   Oral   Take 1 tablet by mouth every 12 (twelve) hours.   10 tablet   0     BP 160/85  Pulse 83  Temp(Src) 99.1 F (37.3 C) (Oral)  Resp 16  Ht 5\' 11"  (1.803 m)  Wt 190 lb (86.183 kg)  BMI 26.51 kg/m2  SpO2 99%  Physical Exam  Nursing note and vitals reviewed. Constitutional: He is oriented to person, place, and time. He appears well-developed and well-nourished.  Cardiovascular: Normal rate and regular rhythm.   Pulmonary/Chest: Effort normal  and breath sounds normal.  Neurological: He is alert and oriented to person, place, and time.  Skin:  Well healing wound to the left axilla with mild drainage:no redness noted    ED Course  Procedures (including critical care time)  Labs Reviewed - No data to display No results found.   1. Abscess       MDM  Pt is okay to follow up as needed:his is on antibiotics:not giving pt narcotics as pt appears to be getting them for a clinic every month        Teressa Lower, NP 11/10/12 1628

## 2012-11-10 NOTE — ED Notes (Signed)
Pt is here for wound recheck in left axilla.  Pt reports increased pain.

## 2012-11-11 NOTE — ED Provider Notes (Signed)
Medical screening examination/treatment/procedure(s) were performed by non-physician practitioner and as supervising physician I was immediately available for consultation/collaboration.   Charles B. Bernette Mayers, MD 11/11/12 [phone]

## 2012-12-03 IMAGING — CT CT HEAD W/O CM
1 series · 16 of 30 positions shown, 20 images · non-contrast
Comparison: 12/21/2010

CLINICAL DATA: Headache.  Hypertension

CT HEAD WITHOUT CONTRAST
TECHNIQUE: Contiguous axial images were obtained from the base of
the skull through the vertex without contrast

[Series 2: head 4.8 h37s · axial · 0.45mm/px · z∈[-162,-10]mm · 16 of 36 slices shown, 20 images]
[im 2/36  brain]
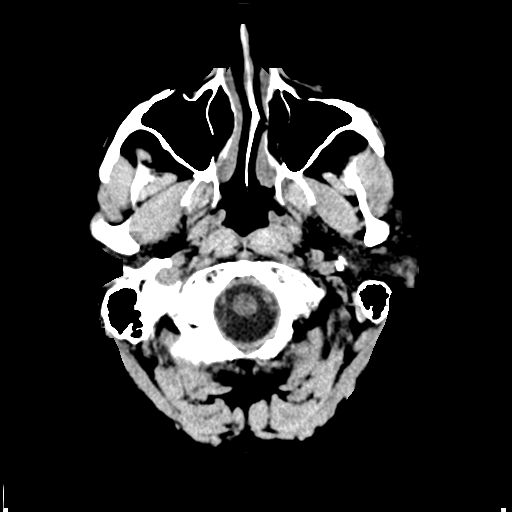
[im 2/36  bone]
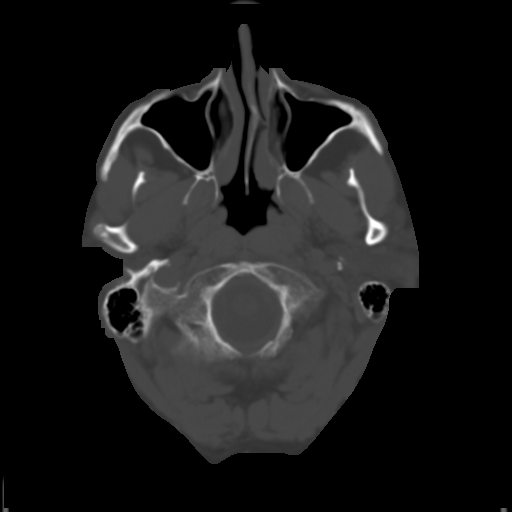
[im 4/36  brain]
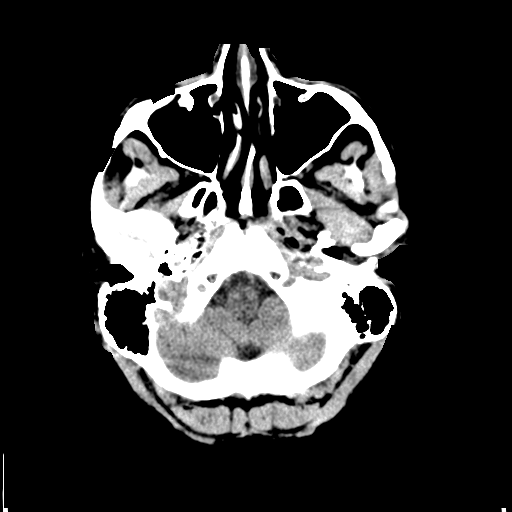
[im 7/36  brain]
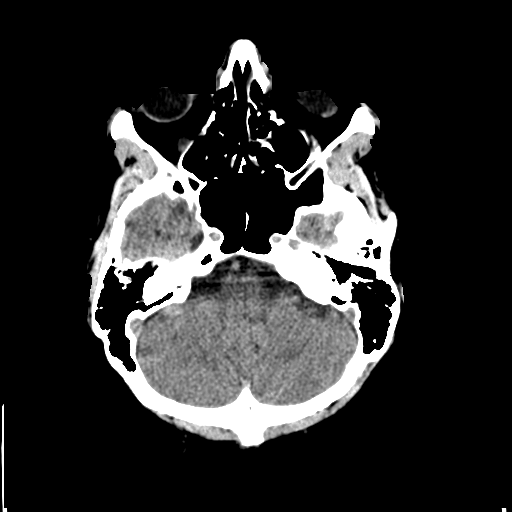
[im 9/36  brain]
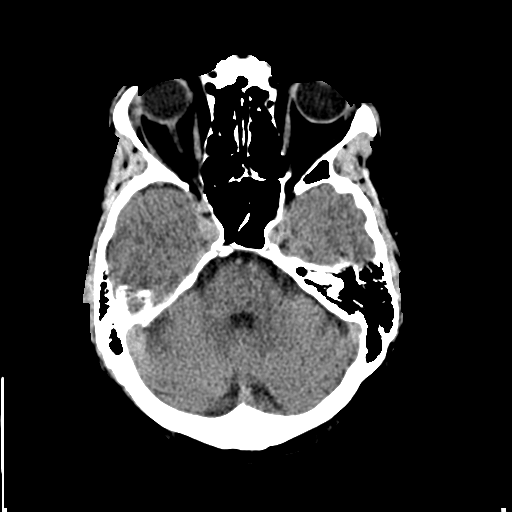
[im 10/36  brain]
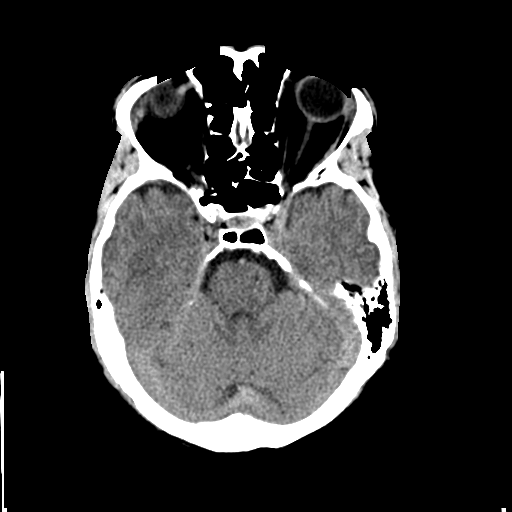
[im 10/36  bone]
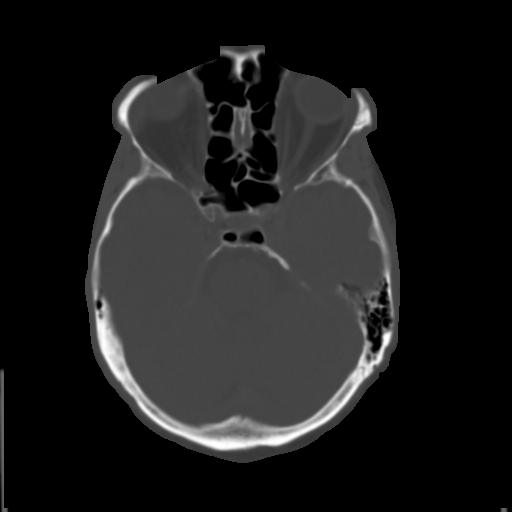
[im 13/36  brain]
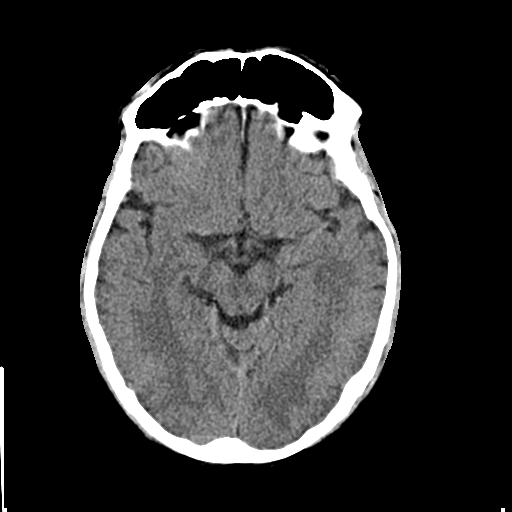
[im 15/36  brain]
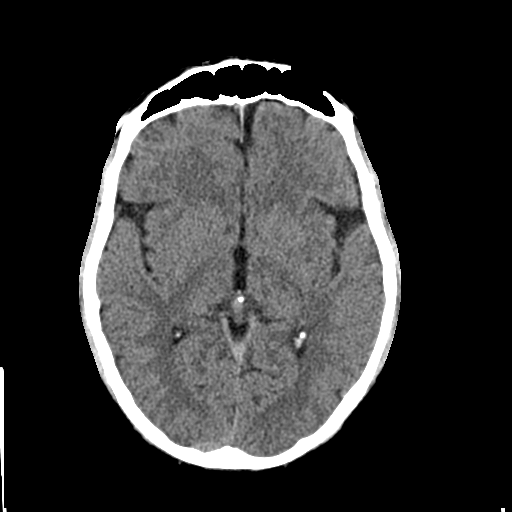
[im 17/36  brain]
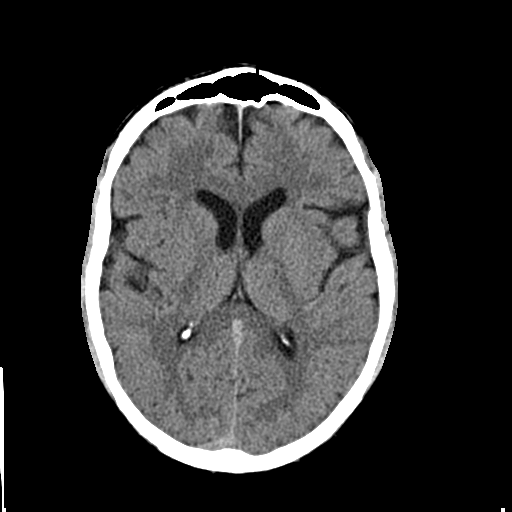
[im 19/36  brain]
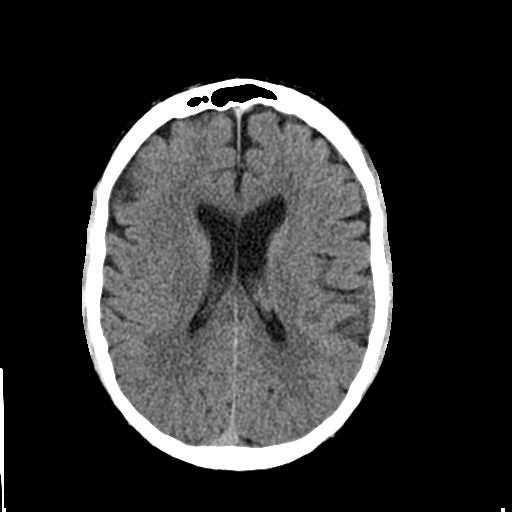
[im 19/36  bone]
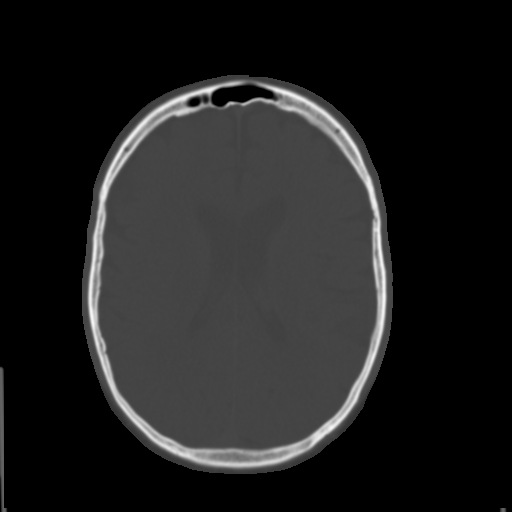
[im 21/36  brain]
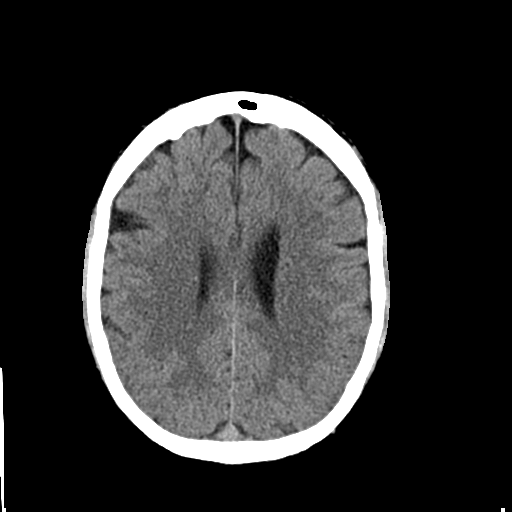
[im 23/36  brain]
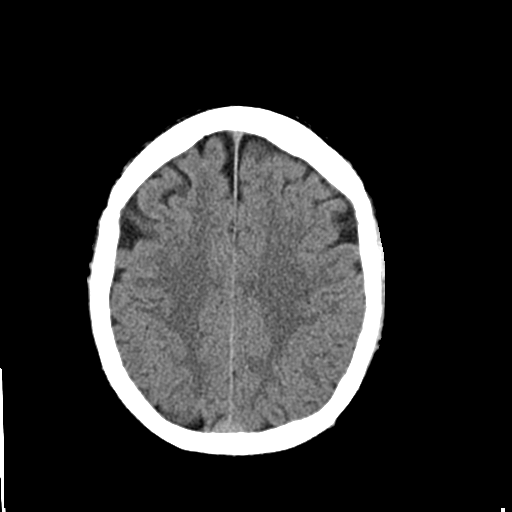
[im 26/36  brain]
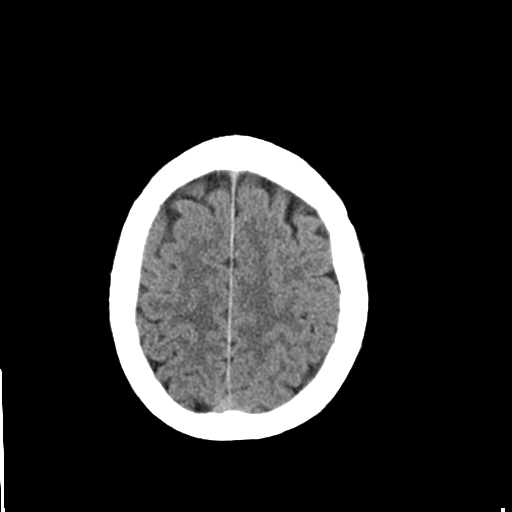
[im 27/36  brain]
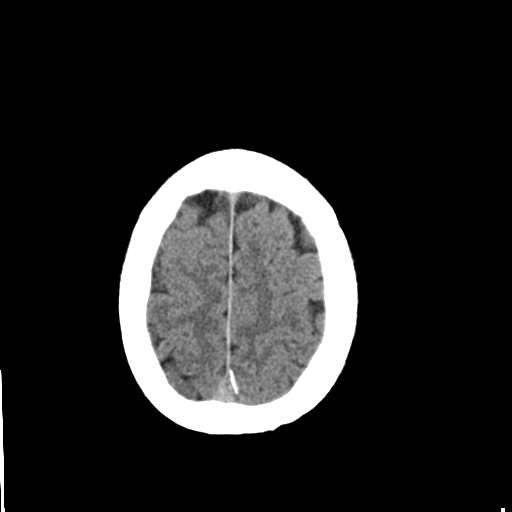
[im 27/36  bone]
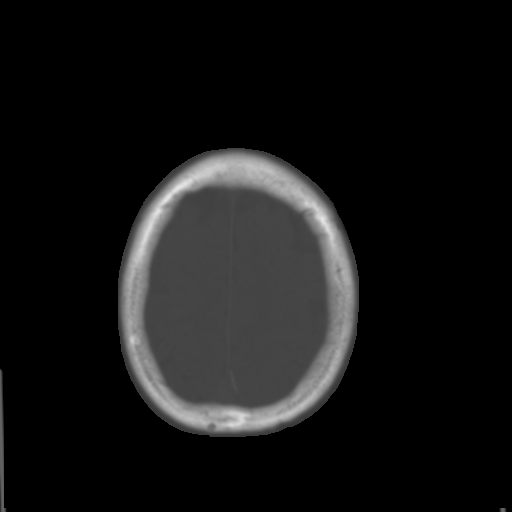
[im 29/36  brain]
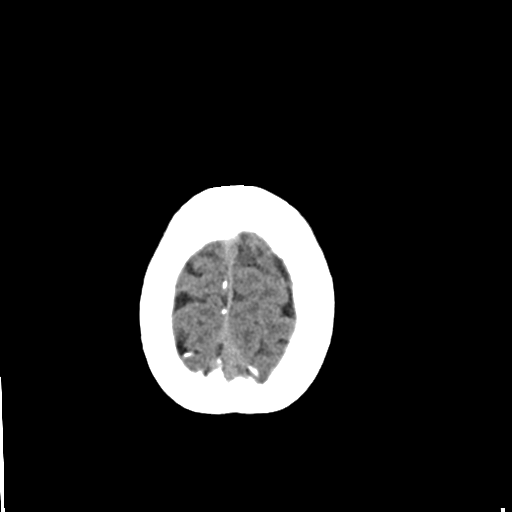
[im 32/36  brain]
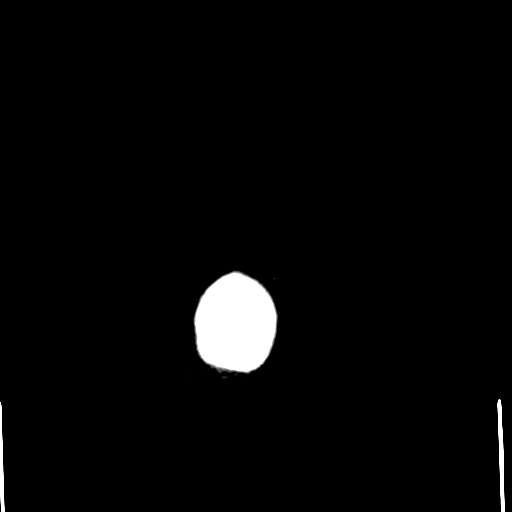
[im 34/36  brain]
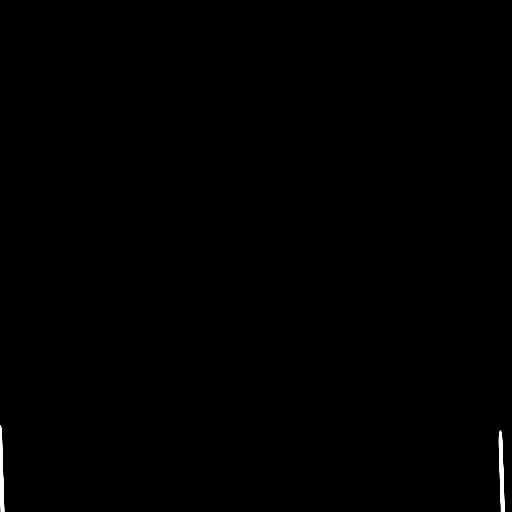

[16 of 30 positions shown; findings below may reference images not displayed]

FINDINGS: The brain has a normal appearance without evidence for
hemorrhage, acute infarction, hydrocephalus, or mass lesion.  There
is no extra axial fluid collection.  The skull and paranasal
sinuses are normal.
IMPRESSION: Normal CT of the head without contrast.

## 2013-02-15 IMAGING — CR DG CHEST 2V
1 series · 1 of 1 positions shown · non-contrast
Comparison: Chest x-ray 12/12/2008.

CLINICAL DATA: Weakness and shortness of breath.  History of
smoking.  Hypertension.  History of skin cancer.

CHEST - 2 VIEW

[w chest lat]
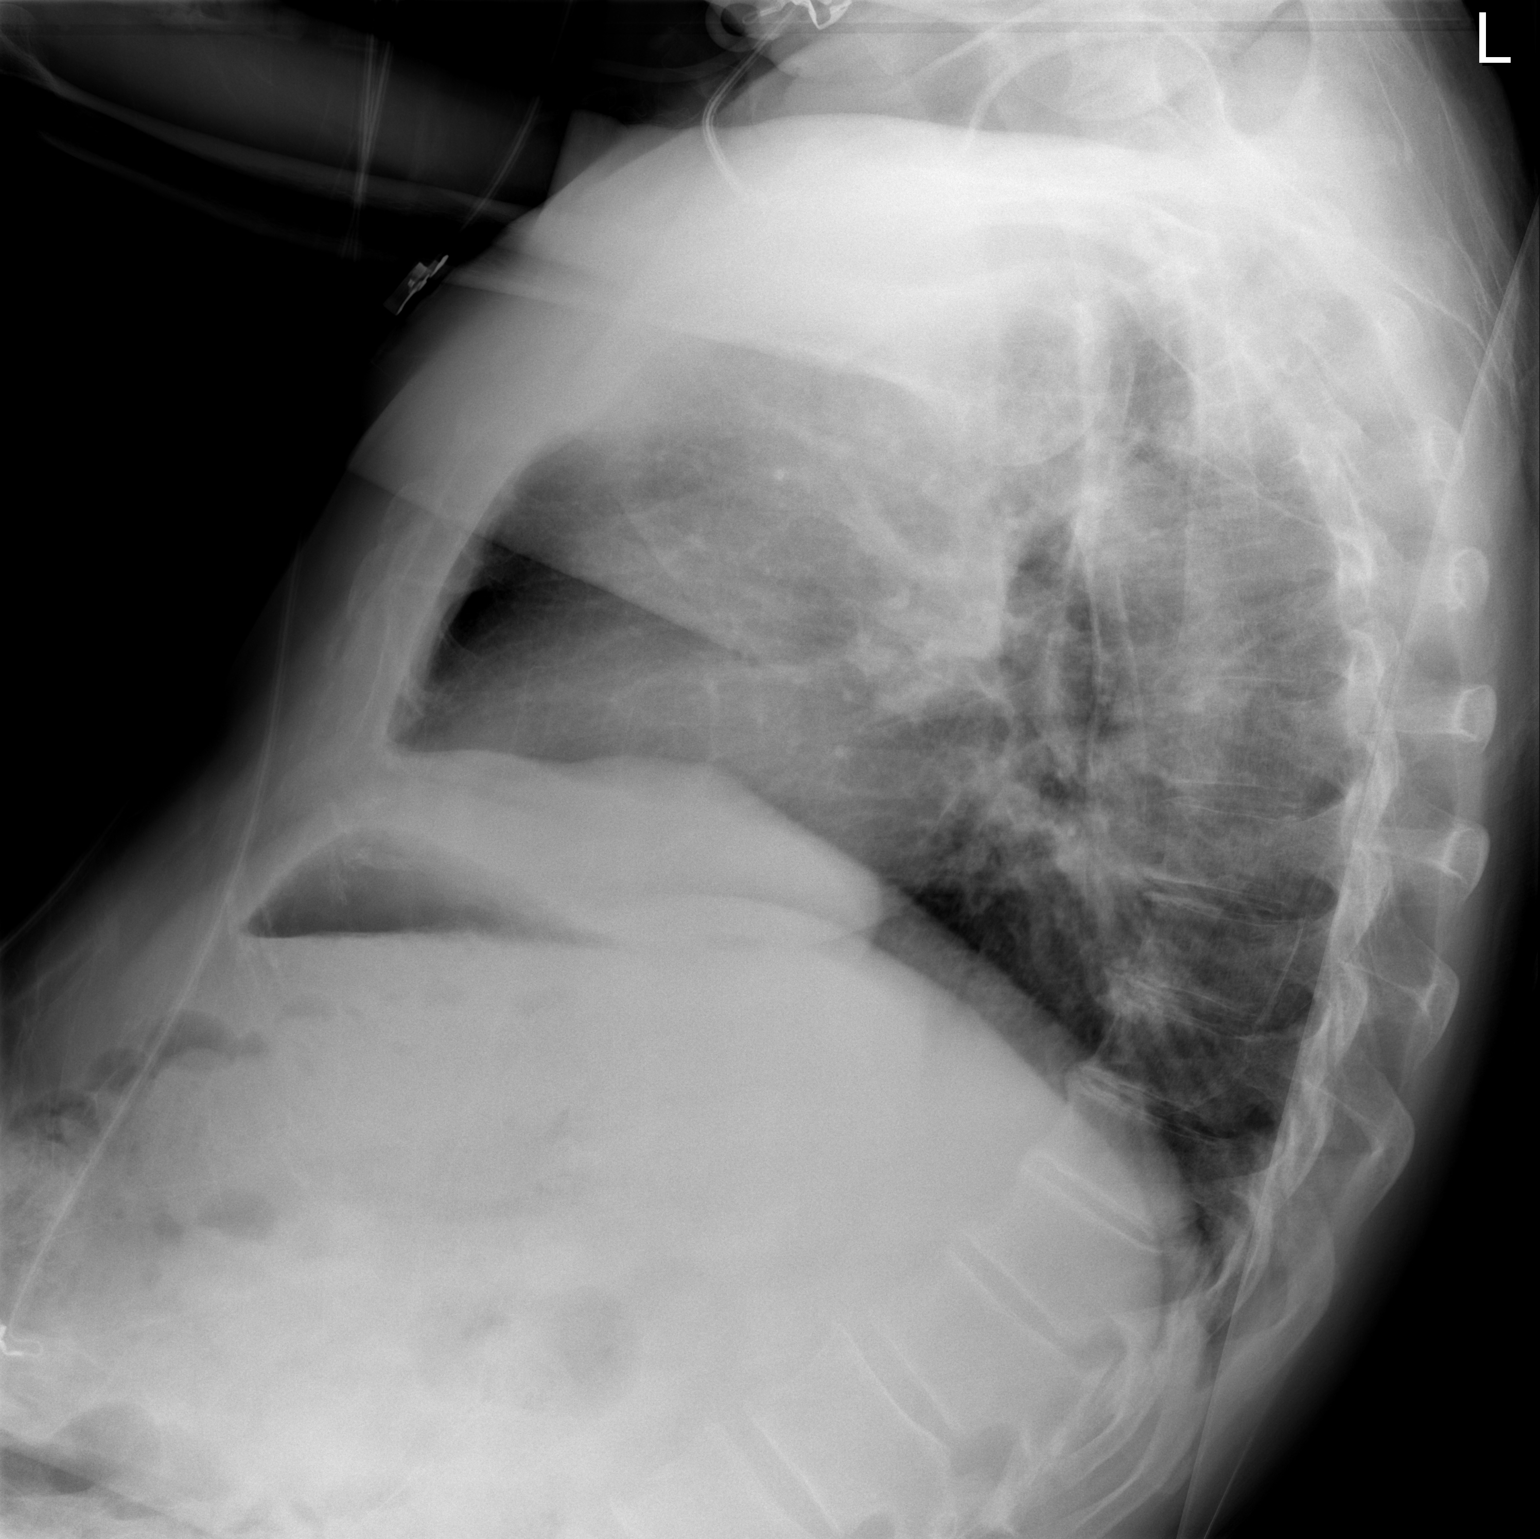

[1 of 1 positions shown; findings below may reference images not displayed]

FINDINGS: Lung volumes are normal.  No consolidative airspace
disease.  No pleural effusions.  Mild cephalization of the
pulmonary vasculature without frank pulmonary edema.  Heart size is
borderline enlarged, with prominence of left ventricular contour.
Atherosclerosis of the thoracic aorta.
IMPRESSION: 1.  Mild pulmonary vascular congestion without frank pulmonary
edema.
2.  Borderline cardiomegaly with prominence of left ventricular
contour which could suggest left ventricular hypertrophy.
3.  Atherosclerosis.

## 2013-05-11 ENCOUNTER — Emergency Department (HOSPITAL_BASED_OUTPATIENT_CLINIC_OR_DEPARTMENT_OTHER)
Admission: EM | Admit: 2013-05-11 | Discharge: 2013-05-11 | Disposition: A | Discharge status: Home / Self Care | Payer: Medicare Other | Attending: Emergency Medicine | Admitting: Emergency Medicine

## 2013-05-11 ENCOUNTER — Encounter (HOSPITAL_BASED_OUTPATIENT_CLINIC_OR_DEPARTMENT_OTHER): Payer: Self-pay | Admitting: Emergency Medicine

## 2013-05-11 DIAGNOSIS — Z79899 Other long term (current) drug therapy: Secondary | ICD-10-CM | POA: Insufficient documentation

## 2013-05-11 DIAGNOSIS — Z85828 Personal history of other malignant neoplasm of skin: Secondary | ICD-10-CM | POA: Insufficient documentation

## 2013-05-11 DIAGNOSIS — F329 Major depressive disorder, single episode, unspecified: Secondary | ICD-10-CM | POA: Insufficient documentation

## 2013-05-11 DIAGNOSIS — Z792 Long term (current) use of antibiotics: Secondary | ICD-10-CM | POA: Insufficient documentation

## 2013-05-11 DIAGNOSIS — Z23 Encounter for immunization: Secondary | ICD-10-CM | POA: Insufficient documentation

## 2013-05-11 DIAGNOSIS — I10 Essential (primary) hypertension: Secondary | ICD-10-CM | POA: Insufficient documentation

## 2013-05-11 DIAGNOSIS — L0211 Cutaneous abscess of neck: Secondary | ICD-10-CM | POA: Insufficient documentation

## 2013-05-11 DIAGNOSIS — G8929 Other chronic pain: Secondary | ICD-10-CM | POA: Insufficient documentation

## 2013-05-11 DIAGNOSIS — L0291 Cutaneous abscess, unspecified: Secondary | ICD-10-CM

## 2013-05-11 DIAGNOSIS — F17200 Nicotine dependence, unspecified, uncomplicated: Secondary | ICD-10-CM | POA: Insufficient documentation

## 2013-05-11 DIAGNOSIS — M129 Arthropathy, unspecified: Secondary | ICD-10-CM | POA: Insufficient documentation

## 2013-05-11 DIAGNOSIS — F3289 Other specified depressive episodes: Secondary | ICD-10-CM | POA: Insufficient documentation

## 2013-05-11 HISTORY — DX: Unspecified malignant neoplasm of skin, unspecified: C44.90

## 2013-05-11 MED ORDER — SULFAMETHOXAZOLE-TRIMETHOPRIM 800-160 MG PO TABS: 1.0000 | ORAL_TABLET | Freq: Two times a day (BID) | ORAL | Status: DC

## 2013-05-11 MED ORDER — TETANUS-DIPHTH-ACELL PERTUSSIS 5-2.5-18.5 LF-MCG/0.5 IM SUSP
0.5000 mL | Freq: Once | INTRAMUSCULAR | Status: AC
  Administered 2013-05-11: 0.5 mL via INTRAMUSCULAR
  Filled 2013-05-11: qty 0.5

## 2013-05-11 NOTE — ED Provider Notes (Signed)
CSN: [id number]     Arrival date & time 05/11/13  1933 History   First MD Initiated Contact with Patient 05/11/13 1944     Chief Complaint  Patient presents with  . Neck Pain   (Consider location/radiation/quality/duration/timing/severity/associated sxs/prior Treatment) HPI Comments: Pt here for abscess to the left neck below the ear:pt states that he has history of abscess:no fever:pt states that he has an avulsion of the skin to the left hand  The history is provided by the patient. No language interpreter was used.    Past Medical History  Diagnosis Date  . Hypertension   . Depression   . Cancer     squamous cell "all in my face arms and hands"  . Chronic pain   . Headache(784.0)   . Arthritis   . Skin cancer    Past Surgical History  Procedure Laterality Date  . Skin cancer excision    . Left wrist    . Cartilage removed from both knees     Family History  Problem Relation Age of Onset  . Cancer Father   . Diabetes Father   . Hypertension Father    History  Substance Use Topics  . Smoking status: Current Some Day Smoker -- 0.50 packs/day    Types: Cigarettes  . Smokeless tobacco: Never Used  . Alcohol Use: No    Review of Systems  Constitutional: Negative.   Respiratory: Negative.   Cardiovascular: Negative.     Allergies  Review of patient's allergies indicates no known allergies.  Home Medications   Current Outpatient Rx  Name  Route  Sig  Dispense  Refill  . clonazePAM (KLONOPIN) 1 MG tablet   Oral   Take 1 mg by mouth 2 (two) times daily as needed for anxiety.         Marland Kitchen diltiazem (CARDIZEM) 120 MG tablet   Oral   Take 120 mg by mouth daily.         Marland Kitchen oxyCODONE-acetaminophen (PERCOCET) 10-325 MG per tablet   Oral   Take 1 tablet by mouth every 8 (eight) hours as needed for pain.          Marland Kitchen oxyCODONE-acetaminophen (PERCOCET/ROXICET) 5-325 MG per tablet   Oral   Take 1-2 tablets by mouth every 6 (six) hours as needed for pain.   4  tablet   0   . oxyCODONE-acetaminophen (PERCOCET/ROXICET) 5-325 MG per tablet   Oral   Take 1 tablet by mouth every 4 (four) hours as needed for pain.   12 tablet   0   . sulfamethoxazole-trimethoprim (SEPTRA DS) 800-160 MG per tablet   Oral   Take 1 tablet by mouth every 12 (twelve) hours.   10 tablet   0    BP 150/99  Pulse 83  Temp(Src) 97.5 F (36.4 C) (Oral)  Resp 20  Ht 5\' 8"  (1.727 m)  Wt 190 lb (86.183 kg)  BMI 28.90 kg/m2  SpO2 99% Physical Exam  Nursing note and vitals reviewed. Constitutional: He appears well-developed and well-nourished.  Cardiovascular: Normal rate and regular rhythm.   Pulmonary/Chest: Effort normal and breath sounds normal.  Musculoskeletal: Normal range of motion.  Skin:  Pt has fluctuant red area to the left neck:no drainage noted:pt has abrasion to fingers from doing work    ED Course  INCISION AND DRAINAGE Date/Time: 05/11/2013 8:15 PM Performed by: Teressa Lower Authorized by: Teressa Lower Consent: Verbal consent obtained. Risks and benefits: risks, benefits and alternatives were discussed Consent  given by: patient Patient identity confirmed: verbally with patient Type: abscess Body area: head/neck Location details: neck Local anesthetic: lidocaine 2% without epinephrine Incision type: single straight Complexity: simple Drainage: purulent Drainage amount: scant Packing material: 1/2 in iodoform gauze Patient tolerance: Patient tolerated the procedure well with no immediate complications.   (including critical care time) Labs Review Labs Reviewed - No data to display Imaging Review No results found.  EKG Interpretation   None       MDM   1. Abscess    Wound I&D will send home on bactrim   Teressa Lower, NP 05/11/13 2018

## 2013-05-11 NOTE — ED Provider Notes (Signed)
Medical screening examination/treatment/procedure(s) were performed by non-physician practitioner and as supervising physician I was immediately available for consultation/collaboration.     Hava Massingale, MD 05/11/13 2305 

## 2013-05-11 NOTE — ED Notes (Signed)
Abscess to his left neck.

## 2013-09-06 ENCOUNTER — Emergency Department (HOSPITAL_COMMUNITY)
Admission: EM | Admit: 2013-09-06 | Discharge: 2013-09-07 | Disposition: A | Discharge status: Home / Self Care | Payer: Medicare Other | Attending: Emergency Medicine | Admitting: Emergency Medicine

## 2013-09-06 ENCOUNTER — Encounter (HOSPITAL_COMMUNITY): Payer: Self-pay | Admitting: Emergency Medicine

## 2013-09-06 DIAGNOSIS — M129 Arthropathy, unspecified: Secondary | ICD-10-CM | POA: Insufficient documentation

## 2013-09-06 DIAGNOSIS — M25569 Pain in unspecified knee: Secondary | ICD-10-CM | POA: Insufficient documentation

## 2013-09-06 DIAGNOSIS — F17200 Nicotine dependence, unspecified, uncomplicated: Secondary | ICD-10-CM | POA: Insufficient documentation

## 2013-09-06 DIAGNOSIS — Z8659 Personal history of other mental and behavioral disorders: Secondary | ICD-10-CM | POA: Insufficient documentation

## 2013-09-06 DIAGNOSIS — G8929 Other chronic pain: Secondary | ICD-10-CM | POA: Insufficient documentation

## 2013-09-06 DIAGNOSIS — Z85828 Personal history of other malignant neoplasm of skin: Secondary | ICD-10-CM | POA: Insufficient documentation

## 2013-09-06 DIAGNOSIS — M25562 Pain in left knee: Secondary | ICD-10-CM

## 2013-09-06 DIAGNOSIS — M25561 Pain in right knee: Secondary | ICD-10-CM

## 2013-09-06 DIAGNOSIS — E049 Nontoxic goiter, unspecified: Secondary | ICD-10-CM | POA: Insufficient documentation

## 2013-09-06 DIAGNOSIS — Z79899 Other long term (current) drug therapy: Secondary | ICD-10-CM | POA: Insufficient documentation

## 2013-09-06 DIAGNOSIS — I10 Essential (primary) hypertension: Secondary | ICD-10-CM | POA: Insufficient documentation

## 2013-09-06 NOTE — ED Notes (Signed)
Pt reports bilateral knee pain that has not gotten better. Pt states that he need knee replacements and has not gotten a referral to see dr. Abbott Pao reports swelling to both knees and doe snot have any pain medication to help with pain. Pt ambulatory to triage and alert.

## 2013-09-07 MED ORDER — HYDROMORPHONE HCL PF 1 MG/ML IJ SOLN
1.0000 mg | Freq: Once | INTRAMUSCULAR | Status: AC
  Administered 2013-09-07: 1 mg via INTRAMUSCULAR
  Filled 2013-09-07: qty 1

## 2013-09-07 MED ORDER — METHYLPREDNISOLONE 4 MG PO KIT: PACK | ORAL | Status: DC

## 2013-09-07 MED ORDER — PREDNISONE 20 MG PO TABS
60.0000 mg | ORAL_TABLET | Freq: Once | ORAL | Status: AC
Start: 2013-09-07 — End: 2013-09-07
  Administered 2013-09-07: 60 mg via ORAL
  Filled 2013-09-07: qty 3

## 2013-09-07 NOTE — Discharge Instructions (Signed)
Osteoarthritis Osteoarthritis is a disease that causes soreness and swelling (inflammation) of a joint. It occurs when the cartilage at the affected joint wears down. Cartilage acts as a cushion, covering the ends of bones where they meet to form a joint. Osteoarthritis is the most common form of arthritis. It often occurs in older people. The joints affected most often by this condition include those in the:  Ends of the fingers.  Thumbs.  Neck.  Lower back.  Knees.  Hips. CAUSES  Over time, the cartilage that covers the ends of bones begins to wear away. This causes bone to rub on bone, producing pain and stiffness in the affected joints.  RISK FACTORS Certain factors can increase your chances of having osteoarthritis, including:  Older age.  Excessive body weight.  Overuse of joints. SIGNS AND SYMPTOMS   Pain, swelling, and stiffness in the joint.  Over time, the joint may lose its normal shape.  Small deposits of bone (osteophytes) may grow on the edges of the joint.  Bits of bone or cartilage can break off and float inside the joint space. This may cause more pain and damage. DIAGNOSIS  Your health care provider will do a physical exam and ask about your symptoms. Various tests may be ordered, such as:  X-rays of the affected joint.  An MRI scan.  Blood tests to rule out other types of arthritis.  Joint fluid tests. This involves using a needle to draw fluid from the joint and examining the fluid under a microscope. TREATMENT  Goals of treatment are to control pain and improve joint function. Treatment plans may include:  A prescribed exercise program that allows for rest and joint relief.  A weight control plan.  Pain relief techniques, such as:  Properly applied heat and cold.  Electric pulses delivered to nerve endings under the skin (transcutaneous electrical nerve stimulation, TENS).  Massage.  Certain nutritional supplements.  Medicines to  control pain, such as:  Acetaminophen.  Nonsteroidal anti-inflammatory drugs (NSAIDs), such as naproxen.  Narcotic or central-acting agents, such as tramadol.  Corticosteroids. These can be given orally or as an injection.  Surgery to reposition the bones and relieve pain (osteotomy) or to remove loose pieces of bone and cartilage. Joint replacement may be needed in advanced states of osteoarthritis. HOME CARE INSTRUCTIONS   Only take over-the-counter or prescription medicines as directed by your health care provider. Take all medicines exactly as instructed.  Maintain a healthy weight. Follow your health care provider's instructions for weight control. This may include dietary instructions.  Exercise as directed. Your health care provider can recommend specific types of exercise. These may include:  Strengthening exercises These are done to strengthen the muscles that support joints affected by arthritis. They can be performed with weights or with exercise bands to add resistance.  Aerobic activities These are exercises, such as brisk walking or low-impact aerobics, that get your heart pumping.  Range-of-motion activities These keep your joints limber.  Balance and agility exercises These help you maintain daily living skills.  Rest your affected joints as directed by your health care provider.  Follow up with your health care provider as directed. SEEK MEDICAL CARE IF:   Your skin turns red.  You develop a rash in addition to your joint pain.  You have worsening joint pain. SEEK IMMEDIATE MEDICAL CARE IF:  You have a significant loss of weight or appetite.  You have a fever along with joint or muscle aches.  You have   night sweats. FOR MORE INFORMATION  National Institute of Arthritis and Musculoskeletal and Skin Diseases: www.niams.nih.gov National Institute on Aging: www.nia.nih.gov American College of Rheumatology: www.rheumatology.org Document Released: 05/20/2005  Document Revised: 03/10/2013 Document Reviewed: 01/25/2013 [employer] Patient Information 2014 [employer].  

## 2013-09-07 NOTE — ED Provider Notes (Signed)
CSN: [id number]     Arrival date & time 09/06/13  2131 History   First MD Initiated Contact with Patient 09/07/13 0006     Chief Complaint  Patient presents with  . Knee Pain     (Consider location/radiation/quality/duration/timing/severity/associated sxs/prior Treatment) HPI HX per PT - bilateral knee pain, was told her needs total R knee replacement.  Pain is chronic, ongoing for weeks. No new symptoms. Pain more severe tonight. No trauma. No rash. No F/C.  No N/V.  No weakness or numbness. Has seen Dr Percell Miller in the past.   Past Medical History  Diagnosis Date  . Hypertension   . Depression   . Cancer     squamous cell "all in my face arms and hands"  . Chronic pain   . Headache(784.0)   . Arthritis   . Skin cancer    Past Surgical History  Procedure Laterality Date  . Skin cancer excision    . Left wrist    . Cartilage removed from both knees     Family History  Problem Relation Age of Onset  . Cancer Father   . Diabetes Father   . Hypertension Father    History  Substance Use Topics  . Smoking status: Current Some Day Smoker -- 0.50 packs/day    Types: Cigarettes  . Smokeless tobacco: Never Used  . Alcohol Use: No    Review of Systems  Constitutional: Negative for fever and chills.  Respiratory: Negative for shortness of breath.   Cardiovascular: Negative for chest pain.  Gastrointestinal: Negative for vomiting and abdominal pain.  Genitourinary: Negative for difficulty urinating.  Musculoskeletal: Negative for back pain and neck pain.  Skin: Negative for rash.  Neurological: Negative for weakness and numbness.  All other systems reviewed and are negative.      Allergies  Review of patient's allergies indicates no known allergies.  Home Medications   Current Outpatient Rx  Name  Route  Sig  Dispense  Refill  . clonazePAM (KLONOPIN) 1 MG tablet   Oral   Take 1 mg by mouth 2 (two) times daily as needed for anxiety.         Marland Kitchen diltiazem  (CARDIZEM) 120 MG tablet   Oral   Take 120 mg by mouth daily.         . diphenhydramine-acetaminophen (TYLENOL PM EXTRA STRENGTH) 25-500 MG TABS   Oral   Take 1 tablet by mouth at bedtime as needed (sleep).         Marland Kitchen oxyCODONE-acetaminophen (PERCOCET) 10-325 MG per tablet   Oral   Take 1 tablet by mouth every 8 (eight) hours as needed for pain.           BP 157/95  Pulse 73  Temp(Src) 98.1 F (36.7 C) (Oral)  Resp 16  SpO2 97% Physical Exam  Constitutional: He is oriented to person, place, and time. He appears well-developed and well-nourished.  HENT:  Head: Normocephalic and atraumatic.  Eyes: EOM are normal. Pupils are equal, round, and reactive to light.  Neck: Neck supple. Thyromegaly present.  Cardiovascular: Normal rate, regular rhythm and intact distal pulses.   Pulmonary/Chest: Effort normal and breath sounds normal. No respiratory distress.  Abdominal: Soft. There is no tenderness.  Musculoskeletal:  Tender over bilateral knees, no sig effusions, no erythema or inc warmth to touch. Distal N/V intact. Gait intact.   Neurological: He is alert and oriented to person, place, and time.  Skin: Skin is warm and dry.  ED Course  Procedures (including critical care time) Labs Review Labs Reviewed - No data to display Imaging Review No results found.   EKG Interpretation None     PT declines knee injections.  PT declines xrays states "I already know what is going on with them"   Plan d/c home, Rx provided, return precautions provided, f/u Orthopedics  MDM   Dx: Bilateral Knee pain, h/o osteoarthritis  No clinical gout or septic joints Medication provided Declines imaging/ joint injections VS and nurses notes reviewed    Teressa Lower, MD 09/07/13 0210

## 2013-09-30 ENCOUNTER — Emergency Department (HOSPITAL_COMMUNITY)
Admission: EM | Admit: 2013-09-30 | Discharge: 2013-09-30 | Disposition: A | Discharge status: Home / Self Care | Payer: Medicare Other | Attending: Emergency Medicine | Admitting: Emergency Medicine

## 2013-09-30 ENCOUNTER — Emergency Department (HOSPITAL_COMMUNITY): Payer: Medicare Other

## 2013-09-30 ENCOUNTER — Encounter (HOSPITAL_COMMUNITY): Payer: Self-pay | Admitting: Emergency Medicine

## 2013-09-30 DIAGNOSIS — G8929 Other chronic pain: Secondary | ICD-10-CM | POA: Insufficient documentation

## 2013-09-30 DIAGNOSIS — M179 Osteoarthritis of knee, unspecified: Secondary | ICD-10-CM

## 2013-09-30 DIAGNOSIS — Z79899 Other long term (current) drug therapy: Secondary | ICD-10-CM | POA: Insufficient documentation

## 2013-09-30 DIAGNOSIS — I10 Essential (primary) hypertension: Secondary | ICD-10-CM | POA: Insufficient documentation

## 2013-09-30 DIAGNOSIS — F329 Major depressive disorder, single episode, unspecified: Secondary | ICD-10-CM | POA: Insufficient documentation

## 2013-09-30 DIAGNOSIS — F3289 Other specified depressive episodes: Secondary | ICD-10-CM | POA: Insufficient documentation

## 2013-09-30 DIAGNOSIS — Z9889 Other specified postprocedural states: Secondary | ICD-10-CM | POA: Insufficient documentation

## 2013-09-30 DIAGNOSIS — M1710 Unilateral primary osteoarthritis, unspecified knee: Secondary | ICD-10-CM | POA: Insufficient documentation

## 2013-09-30 DIAGNOSIS — F17200 Nicotine dependence, unspecified, uncomplicated: Secondary | ICD-10-CM | POA: Insufficient documentation

## 2013-09-30 DIAGNOSIS — IMO0002 Reserved for concepts with insufficient information to code with codable children: Secondary | ICD-10-CM | POA: Insufficient documentation

## 2013-09-30 DIAGNOSIS — Z85828 Personal history of other malignant neoplasm of skin: Secondary | ICD-10-CM | POA: Insufficient documentation

## 2013-09-30 MED ORDER — ONDANSETRON 4 MG PO TBDP
4.0000 mg | ORAL_TABLET | Freq: Once | ORAL | Status: AC
  Administered 2013-09-30: 4 mg via ORAL
  Filled 2013-09-30: qty 1

## 2013-09-30 MED ORDER — NAPROXEN 500 MG PO TABS: 500.0000 mg | ORAL_TABLET | Freq: Two times a day (BID) | ORAL | Status: DC

## 2013-09-30 MED ORDER — TRAMADOL HCL 50 MG PO TABS: 50.0000 mg | ORAL_TABLET | Freq: Four times a day (QID) | ORAL | Status: DC | PRN

## 2013-09-30 MED ORDER — OXYCODONE-ACETAMINOPHEN 5-325 MG PO TABS
2.0000 | ORAL_TABLET | Freq: Once | ORAL | Status: AC
  Administered 2013-09-30: 2 via ORAL
  Filled 2013-09-30: qty 2

## 2013-09-30 NOTE — ED Notes (Signed)
Pt notified what kind of pain med rx to him.  Pt was upset, states "Im not Don [name] my money on that."

## 2013-09-30 NOTE — Discharge Instructions (Signed)
Osteoarthritis Osteoarthritis is a disease that causes soreness and swelling (inflammation) of a joint. It occurs when the cartilage at the affected joint wears down. Cartilage acts as a cushion, covering the ends of bones where they meet to form a joint. Osteoarthritis is the most common form of arthritis. It often occurs in older people. The joints affected most often by this condition include those in the:  Ends of the fingers.  Thumbs.  Neck.  Lower back.  Knees.  Hips. CAUSES  Over time, the cartilage that covers the ends of bones begins to wear away. This causes bone to rub on bone, producing pain and stiffness in the affected joints.  RISK FACTORS Certain factors can increase your chances of having osteoarthritis, including:  Older age.  Excessive body weight.  Overuse of joints. SIGNS AND SYMPTOMS   Pain, swelling, and stiffness in the joint.  Over time, the joint may lose its normal shape.  Small deposits of bone (osteophytes) may grow on the edges of the joint.  Bits of bone or cartilage can break off and float inside the joint space. This may cause more pain and damage. DIAGNOSIS  Your health care provider will do a physical exam and ask about your symptoms. Various tests may be ordered, such as:  X-rays of the affected joint.  An MRI scan.  Blood tests to rule out other types of arthritis.  Joint fluid tests. This involves using a needle to draw fluid from the joint and examining the fluid under a microscope. TREATMENT  Goals of treatment are to control pain and improve joint function. Treatment plans may include:  A prescribed exercise program that allows for rest and joint relief.  A weight control plan.  Pain relief techniques, such as:  Properly applied heat and cold.  Electric pulses delivered to nerve endings under the skin (transcutaneous electrical nerve stimulation, TENS).  Massage.  Certain nutritional supplements.  Medicines to  control pain, such as:  Acetaminophen.  Nonsteroidal anti-inflammatory drugs (NSAIDs), such as naproxen.  Narcotic or central-acting agents, such as tramadol.  Corticosteroids. These can be given orally or as an injection.  Surgery to reposition the bones and relieve pain (osteotomy) or to remove loose pieces of bone and cartilage. Joint replacement may be needed in advanced states of osteoarthritis. HOME CARE INSTRUCTIONS   Only take over-the-counter or prescription medicines as directed by your health care provider. Take all medicines exactly as instructed.  Maintain a healthy weight. Follow your health care provider's instructions for weight control. This may include dietary instructions.  Exercise as directed. Your health care provider can recommend specific types of exercise. These may include:  Strengthening exercises These are done to strengthen the muscles that support joints affected by arthritis. They can be performed with weights or with exercise bands to add resistance.  Aerobic activities These are exercises, such as brisk walking or low-impact aerobics, that get your heart pumping.  Range-of-motion activities These keep your joints limber.  Balance and agility exercises These help you maintain daily living skills.  Rest your affected joints as directed by your health care provider.  Follow up with your health care provider as directed. SEEK MEDICAL CARE IF:   Your skin turns red.  You develop a rash in addition to your joint pain.  You have worsening joint pain. SEEK IMMEDIATE MEDICAL CARE IF:  You have a significant loss of weight or appetite.  You have a fever along with joint or muscle aches.  You have   night sweats. FOR MORE INFORMATION  National Institute of Arthritis and Musculoskeletal and Skin Diseases: www.niams.nih.gov National Institute on Aging: www.nia.nih.gov American College of Rheumatology: www.rheumatology.org Document Released: 05/20/2005  Document Revised: 03/10/2013 Document Reviewed: 01/25/2013 [employer] Patient Information 2014 [employer].  

## 2013-09-30 NOTE — ED Provider Notes (Signed)
CSN: [id number]     Arrival date & time 09/30/13  1231 History  This chart was scribed for non-physician practitioner Margarita Mail, PA-C, working with Malvin Johns, MD, by Neta Ehlers, ED Scribe. This patient was seen in room WTR8/WTR8 and the patient's care was started at 2:23 PM.  First MD Initiated Contact with Patient 09/30/13 1318     Chief Complaint  Patient presents with  . Knee Pain    The history is provided by the patient. No language interpreter was used.   HPI Comments: Don [name] is a 58 y.o. male, with a h/o chronic pain, HTN, and arthritis, who presents to the Emergency Department complaining of bilateral knee pain. He reports the pain is chronic, and he characterizes the pain as "unbearable." The pain increases with pressure, ambulation, and certain movements. There is swelling of the knees associated with the pain as well; [name] is unsure if the swelling has worsened recently. He was treated at the ED two weeks ago where he was prescribed pain medication. The pt is scheduled for surgery next month, at the end of May, at [facility]. He reports he has been informed he needs bilateral total knee replacement. He has a h/o knee injections and he has had fluid drained from his knees; he reports temporary relief with both procedures. He denies a h/o gout.   The pt denies a PCP.   Past Medical History  Diagnosis Date  . Hypertension   . Depression   . Cancer     squamous cell "all in my face arms and hands"  . Chronic pain   . Headache(784.0)   . Arthritis   . Skin cancer    Past Surgical History  Procedure Laterality Date  . Skin cancer excision    . Left wrist    . Cartilage removed from both knees     Family History  Problem Relation Age of Onset  . Cancer Father   . Diabetes Father   . Hypertension Father    History  Substance Use Topics  . Smoking status: Current Some Day Smoker -- 0.50 packs/day    Types: Cigarettes   . Smokeless tobacco: Never Used  . Alcohol Use: No    Review of Systems  Constitutional: Negative for fever.  Musculoskeletal: Positive for arthralgias and joint swelling.  All other systems reviewed and are negative.     Allergies  Review of patient's allergies indicates no known allergies.  Home Medications   Prior to Admission medications   Medication Sig Start Date End Date Taking? Authorizing Provider  clonazePAM (KLONOPIN) 1 MG tablet Take 1 mg by mouth 2 (two) times daily as needed for anxiety.    Historical Provider, MD  diltiazem (CARDIZEM) 120 MG tablet Take 120 mg by mouth daily.    Historical Provider, MD  diphenhydramine-acetaminophen (TYLENOL PM EXTRA STRENGTH) 25-500 MG TABS Take 1 tablet by mouth at bedtime as needed (sleep).    Historical Provider, MD  methylPREDNISolone (MEDROL DOSEPAK) 4 MG tablet follow package directions 09/07/13   Teressa Lower, MD  oxyCODONE-acetaminophen (PERCOCET) 10-325 MG per tablet Take 1 tablet by mouth every 8 (eight) hours as needed for pain.     Historical Provider, MD   Triage Vitals: BP 159/93  Pulse 76  Temp(Src) 98.2 F (36.8 C) (Oral)  Resp 16  SpO2 99%  Physical Exam  Nursing note and vitals reviewed. Constitutional: He is oriented to person, place, and time. He appears well-developed and well-nourished.  No distress.  HENT:  Head: Normocephalic and atraumatic.  Eyes: EOM are normal.  Neck: Neck supple. No tracheal deviation present.  Cardiovascular: Normal rate.   Pulmonary/Chest: Effort normal. No respiratory distress.  Musculoskeletal: Normal range of motion.  Crepitus to the knees bilaterally.   Neurological: He is alert and oriented to person, place, and time.  Skin: Skin is warm and dry.  Psychiatric: He has a normal mood and affect. His behavior is normal.    ED Course  Procedures (including critical care time)  DIAGNOSTIC STUDIES: Oxygen Saturation is 99% on room air, normal by my interpretation.     COORDINATION OF CARE:  2:27 PM- Discussed treatment plan with patient, and the patient agreed to the plan. The plan includes pain medication and imaging.   Labs Review Labs Reviewed - No data to display  Imaging Review Dg Knee Complete 4 Views Left  09/30/2013   CLINICAL DATA:  Pain.  EXAM: LEFT KNEE - COMPLETE 4+ VIEW  COMPARISON:  None.  FINDINGS: Tricompartment degenerative change. No acute bony abnormality identified. No evidence of fracture or dislocation. Findings suggesting loose bodies within the joint space again noted.  IMPRESSION: Severe tricompartment degenerative change with loose bodies again noted. No acute abnormality identified.   Electronically Signed   By: Marcello Moores  Register   On: 09/30/2013 15:10   Dg Knee Complete 4 Views Right  09/30/2013   CLINICAL DATA:  Chronic right knee pain for 2 years. Worsening knee pain over the last 2 weeks.  EXAM: RIGHT KNEE - COMPLETE 4+ VIEW  COMPARISON:  None.  FINDINGS: Severe medial compartment osteoarthritis. Calcifications are present along the medial aspect of the joint, likely representing loose bodies and calcified meniscus. Calcific debris is present within the suprapatellar pouch. Chondrocalcinosis of the lateral meniscus is noted. Severe patellofemoral osteoarthritis. No gross knee effusion. No fracture.  IMPRESSION: 1. Severe medial and patellofemoral compartment osteoarthritis. 2. Chondrocalcinosis commonly associated with CPPD arthropathy. 3. 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   Electronically Signed   By: Dereck Ligas M.D.   On: 09/30/2013 15:12     EKG Interpretation None      MDM   Final diagnoses:  OA (osteoarthritis) of knee    [name] negative for obvious fracture or dislocation. Pain managed in ED. Pt advised to follow up with orthopedics if symptoms persist for possibility of missed fracture diagnosis. Patient given brace while in ED,  conservative therapy recommended and discussed. Patient will be dc home & is agreeable with above plan.   I personally performed the services described in this documentation, which was scribed in my presence. The recorded information has been reviewed and is accurate.      Margarita Mail, PA-C 10/05/13 1121

## 2013-09-30 NOTE — ED Notes (Signed)
Per pt, B/L knee pain-not scheduled for surgery till May-here for pain meds

## 2013-10-09 NOTE — ED Provider Notes (Signed)
Medical screening examination/treatment/procedure(s) were performed by non-physician practitioner and as supervising physician I was immediately available for consultation/collaboration.   EKG Interpretation None        Malvin Johns, MD 10/09/13 [phone]

## 2013-11-23 ENCOUNTER — Encounter (HOSPITAL_BASED_OUTPATIENT_CLINIC_OR_DEPARTMENT_OTHER): Payer: Self-pay | Admitting: Emergency Medicine

## 2013-11-23 ENCOUNTER — Emergency Department (HOSPITAL_BASED_OUTPATIENT_CLINIC_OR_DEPARTMENT_OTHER)
Admission: EM | Admit: 2013-11-23 | Discharge: 2013-11-23 | Disposition: A | Discharge status: Home / Self Care | Payer: Medicare Other | Attending: Emergency Medicine | Admitting: Emergency Medicine

## 2013-11-23 DIAGNOSIS — Z79899 Other long term (current) drug therapy: Secondary | ICD-10-CM | POA: Insufficient documentation

## 2013-11-23 DIAGNOSIS — Z8739 Personal history of other diseases of the musculoskeletal system and connective tissue: Secondary | ICD-10-CM | POA: Insufficient documentation

## 2013-11-23 DIAGNOSIS — G8929 Other chronic pain: Secondary | ICD-10-CM | POA: Insufficient documentation

## 2013-11-23 DIAGNOSIS — I10 Essential (primary) hypertension: Secondary | ICD-10-CM | POA: Insufficient documentation

## 2013-11-23 DIAGNOSIS — Z85828 Personal history of other malignant neoplasm of skin: Secondary | ICD-10-CM | POA: Insufficient documentation

## 2013-11-23 DIAGNOSIS — J0111 Acute recurrent frontal sinusitis: Secondary | ICD-10-CM

## 2013-11-23 DIAGNOSIS — F17200 Nicotine dependence, unspecified, uncomplicated: Secondary | ICD-10-CM | POA: Insufficient documentation

## 2013-11-23 DIAGNOSIS — F329 Major depressive disorder, single episode, unspecified: Secondary | ICD-10-CM | POA: Insufficient documentation

## 2013-11-23 DIAGNOSIS — J0110 Acute frontal sinusitis, unspecified: Secondary | ICD-10-CM | POA: Insufficient documentation

## 2013-11-23 DIAGNOSIS — F3289 Other specified depressive episodes: Secondary | ICD-10-CM | POA: Insufficient documentation

## 2013-11-23 MED ORDER — FLUTICASONE PROPIONATE 50 MCG/ACT NA SUSP: 2.0000 | Freq: Every day | NASAL | Status: DC

## 2013-11-23 MED ORDER — AZITHROMYCIN 250 MG PO TABS: 250.0000 mg | ORAL_TABLET | Freq: Every day | ORAL | Status: DC

## 2013-11-23 NOTE — Discharge Instructions (Signed)
Sinusitis Sinusitis is redness, soreness, and swelling (inflammation) of the paranasal sinuses. Paranasal sinuses are air pockets within the bones of your face (beneath the eyes, the middle of the forehead, or above the eyes). In healthy paranasal sinuses, mucus is able to drain out, and air is able to circulate through them by way of your nose. However, when your paranasal sinuses are inflamed, mucus and air can become trapped. This can allow bacteria and other germs to grow and cause infection. Sinusitis can develop quickly and last only a short time (acute) or continue over a long period (chronic). Sinusitis that lasts for more than 12 weeks is considered chronic.  CAUSES  Causes of sinusitis include:  Allergies.  Structural abnormalities, such as displacement of the cartilage that separates your nostrils (deviated septum), which can decrease the air flow through your nose and sinuses and affect sinus drainage.  Functional abnormalities, such as when the small hairs (cilia) that line your sinuses and help remove mucus do not work properly or are not present. SYMPTOMS  Symptoms of acute and chronic sinusitis are the same. The primary symptoms are pain and pressure around the affected sinuses. Other symptoms include:  Upper toothache.  Earache.  Headache.  Bad breath.  Decreased sense of smell and taste.  A cough, which worsens when you are lying flat.  Fatigue.  Fever.  Thick drainage from your nose, which often is green and may contain pus (purulent).  Swelling and warmth over the affected sinuses. DIAGNOSIS  Your caregiver will perform a physical exam. During the exam, your caregiver may:  Look in your nose for signs of abnormal growths in your nostrils (nasal polyps).  Tap over the affected sinus to check for signs of infection.  View the inside of your sinuses (endoscopy) with a special imaging device with a light attached (endoscope), which is inserted into your  sinuses. If your caregiver suspects that you have chronic sinusitis, one or more of the following tests may be recommended:  Allergy tests.  Nasal culture--A sample of mucus is taken from your nose and sent to a lab and screened for bacteria.  Nasal cytology--A sample of mucus is taken from your nose and examined by your caregiver to determine if your sinusitis is related to an allergy. TREATMENT  Most cases of acute sinusitis are related to a viral infection and will resolve on their own within 10 days. Sometimes medicines are prescribed to help relieve symptoms (pain medicine, decongestants, nasal steroid sprays, or saline sprays).  However, for sinusitis related to a bacterial infection, your caregiver will prescribe antibiotic medicines. These are medicines that will help kill the bacteria causing the infection.  Rarely, sinusitis is caused by a fungal infection. In theses cases, your caregiver will prescribe antifungal medicine. For some cases of chronic sinusitis, surgery is needed. Generally, these are cases in which sinusitis recurs more than 3 times per year, despite other treatments. HOME CARE INSTRUCTIONS   Drink plenty of water. Water helps thin the mucus so your sinuses can drain more easily.  Use a humidifier.  Inhale steam 3 to 4 times a day (for example, sit in the bathroom with the shower running).  Apply a warm, moist washcloth to your face 3 to 4 times a day, or as directed by your caregiver.  Use saline nasal sprays to help moisten and clean your sinuses.  Take over-the-counter or prescription medicines for pain, discomfort, or fever only as directed by your caregiver. SEEK IMMEDIATE MEDICAL CARE IF:    You have increasing pain or severe headaches.  You have nausea, vomiting, or drowsiness.  You have swelling around your face.  You have vision problems.  You have a stiff neck.  You have difficulty breathing. MAKE SURE YOU:   Understand these  instructions.  Will watch your condition.  Will get help right away if you are not doing well or get worse. Document Released: 05/20/2005 Document Revised: 08/12/2011 Document Reviewed: 06/04/2011 [employer] Patient Information 2015 [employer]. This information is not intended to replace advice given to you by your health care provider. Make sure you discuss any questions you have with your health care provider.  

## 2013-11-23 NOTE — ED Notes (Signed)
Pt d/ced in stable condition.

## 2013-11-23 NOTE — ED Notes (Signed)
State he feels head pulsating like when his BP is high. Went to [facility] yesterday with high diastolylic.

## 2013-11-30 NOTE — ED Provider Notes (Signed)
CSN: [id number]     Arrival date & time 11/23/13  1136 History   First MD Initiated Contact with Patient 11/23/13 1212     Chief Complaint  Patient presents with  . Hypertension     (Consider location/radiation/quality/duration/timing/severity/associated sxs/prior Treatment) Patient is a 58 y.o. male presenting with headaches. The history is provided by the patient. No language interpreter was used.  Headache Pain location:  Frontal Associated symptoms: congestion   Associated symptoms: no fever and no nausea   Associated symptoms comment:  Pressure type headache in bilateral frontal area. He is concerned that the headaches stems from uncontrolled hypertension because he does not take his medications for blood pressure. No vomiting. No fever. He has some nasal congestion, sneezing. No sore throat. He states the pain is worse with standing or bending forward.    Past Medical History  Diagnosis Date  . Hypertension   . Depression   . Cancer     squamous cell "all in my face arms and hands"  . Chronic pain   . Headache(784.0)   . Arthritis   . Skin cancer    Past Surgical History  Procedure Laterality Date  . Skin cancer excision    . Left wrist    . Cartilage removed from both knees     Family History  Problem Relation Age of Onset  . Cancer Father   . Diabetes Father   . Hypertension Father    History  Substance Use Topics  . Smoking status: Current Some Day Smoker -- 0.50 packs/day for 20 years    Types: Cigarettes  . Smokeless tobacco: Current User  . Alcohol Use: No    Review of Systems  Constitutional: Negative for fever and chills.  HENT: Positive for congestion.   Eyes: Negative for visual disturbance.  Respiratory: Negative.   Cardiovascular: Negative.  Negative for chest pain and leg swelling.  Gastrointestinal: Negative.  Negative for nausea.  Musculoskeletal: Negative.   Skin: Negative.   Neurological: Positive for headaches.      Allergies   Review of patient's allergies indicates no known allergies.  Home Medications   Prior to Admission medications   Medication Sig Start Date End Date Taking? Authorizing Maliq Pilley  azithromycin (ZITHROMAX) 250 MG tablet Take 1 tablet (250 mg total) by mouth daily. Take first 2 tablets together, then 1 every day until finished. 11/23/13   Shari A Upstill, PA-C  fluticasone (FLONASE) 50 MCG/ACT nasal spray Place 2 sprays into both nostrils daily. 11/23/13   Shari A Upstill, PA-C  naproxen (NAPROSYN) 500 MG tablet Take 1 tablet (500 mg total) by mouth 2 (two) times daily with a meal. 09/30/13   Margarita Mail, PA-C  traMADol (ULTRAM) 50 MG tablet Take 1 tablet (50 mg total) by mouth every 6 (six) hours as needed. 09/30/13   Abigail Harris, PA-C   BP 137/90  Pulse 96  Temp(Src) 99.9 F (37.7 C) (Oral)  SpO2 97% Physical Exam  Constitutional: He is oriented to person, place, and time. He appears well-developed and well-nourished.  HENT:  Head: Normocephalic.  Nose: Mucosal edema present. Right sinus exhibits frontal sinus tenderness. Left sinus exhibits frontal sinus tenderness.  Mouth/Throat: Oropharynx is clear and moist.  Neck: Normal range of motion. Neck supple.  Cardiovascular: Normal rate, regular rhythm and normal heart sounds.   No murmur heard. Pulmonary/Chest: Effort normal and breath sounds normal. He has no wheezes. He has no rales.  Abdominal: Soft. Bowel sounds are normal. He exhibits no distension.  There is no tenderness. There is no rebound and no guarding.  Musculoskeletal: Normal range of motion. He exhibits no edema.  Lymphadenopathy:    He has no cervical adenopathy.  Neurological: He is alert and oriented to person, place, and time.  Skin: Skin is warm and dry. No rash noted. No pallor.  Psychiatric: He has a normal mood and affect.    ED Course  Procedures (including critical care time) Labs Review Labs Reviewed - No data to display  Imaging Review No results  found.   EKG Interpretation None      MDM   Final diagnoses:  Acute recurrent frontal sinusitis    Blood pressure here is normal. He exhibits sinus tenderness on exam and reports a history of sinus problems. Suspect headache is related to sinus pressure. Stable for discharge.     Dewaine Oats, PA-C 11/30/13 1108

## 2013-12-01 NOTE — ED Provider Notes (Signed)
Medical screening examination/treatment/procedure(s) were performed by non-physician practitioner and as supervising physician I was immediately available for consultation/collaboration.   EKG Interpretation None        Malvin Johns, MD 12/01/13 1531

## 2013-12-08 ENCOUNTER — Emergency Department (HOSPITAL_BASED_OUTPATIENT_CLINIC_OR_DEPARTMENT_OTHER)
Admission: EM | Admit: 2013-12-08 | Discharge: 2013-12-08 | Disposition: A | Discharge status: Home / Self Care | Payer: Medicare Other | Attending: Emergency Medicine | Admitting: Emergency Medicine

## 2013-12-08 ENCOUNTER — Encounter (HOSPITAL_BASED_OUTPATIENT_CLINIC_OR_DEPARTMENT_OTHER): Payer: Self-pay | Admitting: Emergency Medicine

## 2013-12-08 DIAGNOSIS — Z8659 Personal history of other mental and behavioral disorders: Secondary | ICD-10-CM | POA: Insufficient documentation

## 2013-12-08 DIAGNOSIS — M129 Arthropathy, unspecified: Secondary | ICD-10-CM | POA: Insufficient documentation

## 2013-12-08 DIAGNOSIS — Z85828 Personal history of other malignant neoplasm of skin: Secondary | ICD-10-CM | POA: Insufficient documentation

## 2013-12-08 DIAGNOSIS — G8929 Other chronic pain: Secondary | ICD-10-CM | POA: Insufficient documentation

## 2013-12-08 DIAGNOSIS — Z792 Long term (current) use of antibiotics: Secondary | ICD-10-CM | POA: Insufficient documentation

## 2013-12-08 DIAGNOSIS — M25562 Pain in left knee: Secondary | ICD-10-CM

## 2013-12-08 DIAGNOSIS — J321 Chronic frontal sinusitis: Secondary | ICD-10-CM | POA: Insufficient documentation

## 2013-12-08 DIAGNOSIS — F17200 Nicotine dependence, unspecified, uncomplicated: Secondary | ICD-10-CM | POA: Insufficient documentation

## 2013-12-08 DIAGNOSIS — Z791 Long term (current) use of non-steroidal anti-inflammatories (NSAID): Secondary | ICD-10-CM | POA: Insufficient documentation

## 2013-12-08 DIAGNOSIS — M25569 Pain in unspecified knee: Secondary | ICD-10-CM | POA: Insufficient documentation

## 2013-12-08 DIAGNOSIS — IMO0002 Reserved for concepts with insufficient information to code with codable children: Secondary | ICD-10-CM | POA: Insufficient documentation

## 2013-12-08 DIAGNOSIS — M25561 Pain in right knee: Secondary | ICD-10-CM

## 2013-12-08 DIAGNOSIS — M549 Dorsalgia, unspecified: Secondary | ICD-10-CM | POA: Insufficient documentation

## 2013-12-08 DIAGNOSIS — I10 Essential (primary) hypertension: Secondary | ICD-10-CM | POA: Insufficient documentation

## 2013-12-08 MED ORDER — TRAMADOL HCL 50 MG PO TABS: 50.0000 mg | ORAL_TABLET | Freq: Four times a day (QID) | ORAL | Status: DC | PRN

## 2013-12-08 MED ORDER — METHYLPREDNISOLONE (PAK) 4 MG PO TABS: ORAL_TABLET | ORAL | Status: DC

## 2013-12-08 NOTE — Discharge Instructions (Signed)
Chronic Back Pain  When back pain lasts longer than 3 months, it is called chronic back pain.People with chronic back pain often go through certain periods that are more intense (flare-ups).  CAUSES Chronic back pain can be caused by wear and tear (degeneration) on different structures in your back. These structures include:  The bones of your spine (vertebrae) and the joints surrounding your spinal cord and nerve roots (facets).  The strong, fibrous tissues that connect your vertebrae (ligaments). Degeneration of these structures may result in pressure on your nerves. This can lead to constant pain. HOME CARE INSTRUCTIONS  Avoid bending, heavy lifting, prolonged sitting, and activities which make the problem worse.  Take brief periods of rest throughout the day to reduce your pain. Lying down or standing usually is better than sitting while you are resting.  Take over-the-counter or prescription medicines only as directed by your caregiver. SEEK IMMEDIATE MEDICAL CARE IF:   You have weakness or numbness in one of your legs or feet.  You have trouble controlling your bladder or bowels.  You have nausea, vomiting, abdominal pain, shortness of breath, or fainting. Document Released: 06/27/2004 Document Revised: 08/12/2011 Document Reviewed: 05/04/2011 [facility] Patient Information 2015 [place], [place]. This information is not intended to replace advice given to you by your health care provider. Make sure you discuss any questions you have with your health care provider.  Knee Pain The knee is the complex joint between your thigh and your lower leg. It is made up of bones, tendons, ligaments, and cartilage. The bones that make up the knee are:  The femur in the thigh.  The tibia and fibula in the lower leg.  The patella or kneecap riding in the groove on the lower femur. CAUSES  Knee pain is a common complaint with many causes. A few of these causes are:  Injury, such as:  A  ruptured ligament or tendon injury.  Torn cartilage.  Medical conditions, such as:  Gout  Arthritis  Infections  Overuse, over training, or overdoing a physical activity. Knee pain can be minor or severe. Knee pain can accompany debilitating injury. Minor knee problems often respond well to self-care measures or get well on their own. More serious injuries may need medical intervention or even surgery. SYMPTOMS The knee is complex. Symptoms of knee problems can vary widely. Some of the problems are:  Pain with movement and weight bearing.  Swelling and tenderness.  Buckling of the knee.  Inability to straighten or extend your knee.  Your knee locks and you cannot straighten it.  Warmth and redness with pain and fever.  Deformity or dislocation of the kneecap. DIAGNOSIS  Determining what is wrong may be very straight forward such as when there is an injury. It can also be challenging because of the complexity of the knee. Tests to make a diagnosis may include:  Your caregiver taking a history and doing a physical exam.  Routine X-rays can be used to rule out other problems. X-rays will not reveal a cartilage tear. Some injuries of the knee can be diagnosed by:  Arthroscopy a surgical technique by which a small video camera is inserted through tiny incisions on the sides of the knee. This procedure is used to examine and repair internal knee joint problems. Tiny instruments can be used during arthroscopy to repair the torn knee cartilage (meniscus).  Arthrography is a radiology technique. A contrast liquid is directly injected into the knee joint. Internal structures of the knee joint  then become visible on X-ray film.  An MRI scan is a non X-ray radiology procedure in which magnetic fields and a computer produce two- or three-dimensional images of the inside of the knee. Cartilage tears are often visible using an MRI scanner. MRI scans have largely replaced arthrography in  diagnosing cartilage tears of the knee.  Blood work.  Examination of the fluid that helps to lubricate the knee joint (synovial fluid). This is done by taking a sample out using a needle and a syringe. TREATMENT The treatment of knee problems depends on the cause. Some of these treatments are:  Depending on the injury, proper casting, splinting, surgery, or physical therapy care will be needed.  Give yourself adequate recovery time. Do not overuse your joints. If you begin to get sore during workout routines, back off. Slow down or do fewer repetitions.  For repetitive activities such as cycling or running, maintain your strength and nutrition.  Alternate muscle groups. For example, if you are a weight lifter, work the upper body on one day and the lower body the next.  Either tight or weak muscles do not give the proper support for your knee. Tight or weak muscles do not absorb the stress placed on the knee joint. Keep the muscles surrounding the knee strong.  Take care of mechanical problems.  If you have flat feet, orthotics or special shoes may help. See your caregiver if you need help.  Arch supports, sometimes with wedges on the inner or outer aspect of the heel, can help. These can shift pressure away from the side of the knee most bothered by osteoarthritis.  A brace called an "unloader" brace also may be used to help ease the pressure on the most arthritic side of the knee.  If your caregiver has prescribed crutches, braces, wraps or ice, use as directed. The acronym for this is PRICE. This means protection, rest, ice, compression, and elevation.  Nonsteroidal anti-inflammatory drugs (NSAIDs), can help relieve pain. But if taken immediately after an injury, they may actually increase swelling. Take NSAIDs with food in your stomach. Stop them if you develop stomach problems. Do not take these if you have a history of ulcers, stomach pain, or bleeding from the bowel. Do not take  without your caregiver's approval if you have problems with fluid retention, heart failure, or kidney problems.  For ongoing knee problems, physical therapy may be helpful.  Glucosamine and chondroitin are over-the-counter dietary supplements. Both may help relieve the pain of osteoarthritis in the knee. These medicines are different from the usual anti-inflammatory drugs. Glucosamine may decrease the rate of cartilage destruction.  Injections of a corticosteroid drug into your knee joint may help reduce the symptoms of an arthritis flare-up. They may provide pain relief that lasts a few months. You may have to wait a few months between injections. The injections do have a small increased risk of infection, water retention, and elevated blood sugar levels.  Hyaluronic acid injected into damaged joints may ease pain and provide lubrication. These injections may work by reducing inflammation. A series of shots may give relief for as long as 6 months.  Topical painkillers. Applying certain ointments to your skin may help relieve the pain and stiffness of osteoarthritis. Ask your pharmacist for suggestions. Many over the-counter products are approved for temporary relief of arthritis pain.  In some countries, doctors often prescribe topical NSAIDs for relief of chronic conditions such as arthritis and tendinitis. A review of treatment with NSAID creams  found that they worked as well as oral medications but without the serious side effects. PREVENTION  Maintain a healthy weight. Extra pounds put more strain on your joints.  Get strong, stay limber. Weak muscles are a common cause of knee injuries. Stretching is important. Include flexibility exercises in your workouts.  Be smart about exercise. If you have osteoarthritis, chronic knee pain or recurring injuries, you may need to change the way you exercise. This does not mean you have to stop being active. If your knees ache after jogging or playing  basketball, consider switching to swimming, water aerobics, or other low-impact activities, at least for a few days a week. Sometimes limiting high-impact activities will provide relief.  Make sure your shoes fit well. Choose footwear that is right for your sport.  Protect your knees. Use the proper gear for knee-sensitive activities. Use kneepads when playing volleyball or laying carpet. Buckle your seat belt every time you drive. Most shattered kneecaps occur in car accidents.  Rest when you are tired. SEEK MEDICAL CARE IF:  You have knee pain that is continual and does not seem to be getting better.  SEEK IMMEDIATE MEDICAL CARE IF:  Your knee joint feels hot to the touch and you have a high fever. MAKE SURE YOU:   Understand these instructions.  Will watch your condition.  Will get help right away if you are not doing well or get worse. Document Released: 03/17/2007 Document Revised: 08/12/2011 Document Reviewed: 03/17/2007 [facility] Patient Information 2015 [place], [place]. This information is not intended to replace advice given to you by your health care provider. Make sure you discuss any questions you have with your health care provider.  Sinusitis Sinusitis is redness, soreness, and swelling (inflammation) of the paranasal sinuses. Paranasal sinuses are air pockets within the bones of your face (beneath the eyes, the middle of the forehead, or above the eyes). In healthy paranasal sinuses, mucus is able to drain out, and air is able to circulate through them by way of your nose. However, when your paranasal sinuses are inflamed, mucus and air can become trapped. This can allow bacteria and other germs to grow and cause infection. Sinusitis can develop quickly and last only a short time (acute) or continue over a long period (chronic). Sinusitis that lasts for more than 12 weeks is considered chronic.  CAUSES  Causes of sinusitis include:  Allergies.  Structural abnormalities,  such as displacement of the cartilage that separates your nostrils (deviated septum), which can decrease the air flow through your nose and sinuses and affect sinus drainage.  Functional abnormalities, such as when the small hairs (cilia) that line your sinuses and help remove mucus do not work properly or are not present. SYMPTOMS  Symptoms of acute and chronic sinusitis are the same. The primary symptoms are pain and pressure around the affected sinuses. Other symptoms include:  Upper toothache.  Earache.  Headache.  Bad breath.  Decreased sense of smell and taste.  A cough, which worsens when you are lying flat.  Fatigue.  Fever.  Thick drainage from your nose, which often is green and may contain pus (purulent).  Swelling and warmth over the affected sinuses. DIAGNOSIS  Your caregiver will perform a physical exam. During the exam, your caregiver may:  Look in your nose for signs of abnormal growths in your nostrils (nasal polyps).  Tap over the affected sinus to check for signs of infection.  View the inside of your sinuses (endoscopy) with a special  imaging device with a light attached (endoscope), which is inserted into your sinuses. If your caregiver suspects that you have chronic sinusitis, one or more of the following tests may be recommended:  Allergy tests.  Nasal culture--A sample of mucus is taken from your nose and sent to a lab and screened for bacteria.  Nasal cytology--A sample of mucus is taken from your nose and examined by your caregiver to determine if your sinusitis is related to an allergy. TREATMENT  Most cases of acute sinusitis are related to a viral infection and will resolve on their own within 10 days. Sometimes medicines are prescribed to help relieve symptoms (pain medicine, decongestants, nasal steroid sprays, or saline sprays).  However, for sinusitis related to a bacterial infection, your caregiver will prescribe antibiotic medicines. These  are medicines that will help kill the bacteria causing the infection.  Rarely, sinusitis is caused by a fungal infection. In theses cases, your caregiver will prescribe antifungal medicine. For some cases of chronic sinusitis, surgery is needed. Generally, these are cases in which sinusitis recurs more than 3 times per year, despite other treatments. HOME CARE INSTRUCTIONS   Drink plenty of water. Water helps thin the mucus so your sinuses can drain more easily.  Use a humidifier.  Inhale steam 3 to 4 times a day (for example, sit in the bathroom with the shower running).  Apply a warm, moist washcloth to your face 3 to 4 times a day, or as directed by your caregiver.  Use saline nasal sprays to help moisten and clean your sinuses.  Take over-the-counter or prescription medicines for pain, discomfort, or fever only as directed by your caregiver. SEEK IMMEDIATE MEDICAL CARE IF:  You have increasing pain or severe headaches.  You have nausea, vomiting, or drowsiness.  You have swelling around your face.  You have vision problems.  You have a stiff neck.  You have difficulty breathing. MAKE SURE YOU:   Understand these instructions.  Will watch your condition.  Will get help right away if you are not doing well or get worse. Document Released: 05/20/2005 Document Revised: 08/12/2011 Document Reviewed: 06/04/2011 [facility] Patient Information 2015 [place], [place]. This information is not intended to replace advice given to you by your health care provider. Make sure you discuss any questions you have with your health care provider.     Emergency Department Resource Guide 1) Find a Doctor and Pay Out of Pocket Although you won't have to find out who is covered by your insurance plan, it is a good idea to ask around and get recommendations. You will then need to call the office and see if the doctor you have chosen will accept you as a new patient and what types of options they  offer for patients who are self-pay. Some doctors offer discounts or will set up payment plans for their patients who do not have insurance, but you will need to ask so you aren't surprised when you get to your appointment.  2) Contact Your Local Health Department Not all health departments have doctors that can see patients for sick visits, but many do, so it is worth a call to see if yours does. If you don't know where your local health department is, you can check in your phone book. The CDC also has a tool to help you locate your state's health department, and many state websites also have listings of all of their local health departments.  3) Find a [employer] If your  illness is not likely to be very severe or complicated, you may want to try a walk in clinic. These are popping up all over the country in pharmacies, drugstores, and shopping centers. They're usually staffed by nurse practitioners or physician assistants that have been trained to treat common illnesses and complaints. They're usually fairly quick and inexpensive. However, if you have serious medical issues or chronic medical problems, these are probably not your best option.  No Primary Care Doctor: - Call Health Connect at  [phone] - they can help you locate a primary care doctor that  accepts your insurance, provides certain services, etc. - Physician Referral Service- [phone]  Chronic Pain Problems: Organization         Address  Phone   Notes  [facility]  [phone] Patients need to be referred by their primary care doctor.   Medication Assistance: Organization         Address  Phone   Notes  [facility] Medication [facility] [address] [place], [place] [address] [phone] --Must be a resident of [facility] -- Must have NO insurance coverage whatsoever (no Medicaid/ Medicare, etc.) -- The pt. MUST have a primary care doctor that directs their care  regularly and follows them in the community   MedAssist  [phone]   [employer]  [phone]    Agencies that provide inexpensive medical care: Organization         Address  Phone   Notes  [place]  [phone]   [facility]    [phone]   [facility] [place], [place] [address] [phone]   [place] [address] [place] [phone]   Planned Parenthood    [phone]   [facility]    [phone]   [place] [address] [place], [place] Phone:  [phone], Fax:  [phone] Hours of Operation:  9 am - 6 pm, M-F.  Also accepts Medicaid/Medicare and self-pay.  [facility] for [place], [address], [place] Phone: [phone], Fax: [phone]. Hours of Operation:  8:30 am - 5:30 pm, M-F.  Also accepts Medicaid and self-pay.  [facility] [address] [place] Phone: [phone]   [place], [place], [place] [phone], Ext. [phone] Mondays & Thursdays: 7-9 AM.  First 15 patients are seen on a first come, first serve basis.    [place] Providers:  Organization         Address  Phone   Notes  [facility] [address], [place] [phone] Also accepts self-pay patients.  [facility] [address], [place]  [phone]   [place], [address], [place] [phone]   [facility] [address], [place] [phone]   Lucianne Lei [address], [place]   [phone] Only accepts [place] Access [place] patients after they have their name applied to their card.   Self-Pay (no insurance) in [facility]:  Organization         Address  Phone    Notes  Sickle Cell Patients, [facility] [place], [place] [phone]  [facility] [place] [phone]   [facility] [place]  [place], [address], [place] [phone]   [facility]/Dr. Osei-Bonsu  [address], [place] or [address], [place] [phone] Phone number for both [place] and [place] locations is the same.  [facility] [place] [address] [place] [phone]   [facility] [address], [place] or [address] [phone]   [facility] [address], [place] [phone], phone; [phone], fax Sees patients 1st and 3rd Saturday of every month.  Must not qualify for public or private insurance (i.e. Medicaid, Medicare, [place] Health Choice, Veterans' Benefits)  Household income should be no more than 200% of the poverty level The clinic cannot treat you if you are pregnant or think you are pregnant  Sexually transmitted diseases are not treated at the clinic.    Dental Care: Organization         Address  Phone  Notes  [facility] Department of [facility] [place] [phone] Accepts children up to age 73 who are enrolled in [place] or [place]; pregnant women with a Medicaid card; and children who have applied for Medicaid or [place] Health Choice, but were declined, whose parents can pay a reduced fee at time of service.  [facility]  [address], [place] [phone] Accepts children up to age 31 who are enrolled in [place] or [place]; pregnant women with a Medicaid card; and children who have applied for Medicaid or [place] Health Choice, but were declined, whose parents can pay a reduced fee at time of service.  [place]  Adult Dental Access PROGRAM  [place] [phone] Patients are seen by appointment only. Walk-ins are not accepted. [place] will see patients 75 years of age and older. Monday - Tuesday (8am-5pm) Most Wednesdays (8:30-5pm) $30 per visit, cash only  [facility] Adult Dental Access PROGRAM  [address], [facility] [phone] Patients are seen by appointment only. Walk-ins are not accepted. [place] will see patients 54 years of age and older. One Wednesday Evening (Monthly: Volunteer Based).  $30 per visit, cash only  [place]  [phone] for adults; Children under age 41, call Graduate Pediatric Dentistry at [phone]. Children aged 61-14, please call [phone] to request a pediatric application.  Dental services are provided in all areas of dental care including fillings, crowns and bridges, complete and partial dentures, implants, gum treatment, root canals, and extractions. Preventive care is also provided. Treatment is provided to both adults and children. Patients are selected via a lottery and there is often a waiting list.   [facility] [address] [place]  [phone] [email or web address]   [facility] [address] [place], [place] [phone], Ext. [phone] Second and Fourth Thursday of each month, opens at 6:30 AM; Clinic ends at 9 AM.  Patients are seen on a first-come first-served basis, and a limited number are seen during each clinic.   [facility]  [address] [place] Danker [place], [place] [phone]   Eligibility Requirements You must have lived in [place], [place], or [place] counties for at least the last  three months.   You cannot be eligible for state or federal sponsored [employer], including [employer], [place], or [employer].   You generally cannot be eligible for healthcare insurance through your employer.    How to  apply: Eligibility screenings are held every Tuesday and Wednesday afternoon from 1:00 pm until 4:00 pm. You do not need an appointment for the interview!  [facility] [address], [place], [place]   [place]  [place] Department  [place]  [phone]    Behavioral Health Resources in the Community: Intensive Outpatient Programs Organization         Address  Phone  Notes  [place]. [address], [place], [place] [phone]   [facility] Outpatient [address] [place], [place]   ADS: Alcohol & Drug Svcs [address] [place], [place]   [place] [address],  [place], [place] or [phone]   Substance Abuse Resources Organization         Address  Phone  Notes  Alcohol and Drug Services  [phone]   [place]  [phone]   [facility] [place]   [place]inita Pester  [phone]   Residential & Outpatient Substance Abuse Program  [phone]   Psychological Services Organization         Address  Phone  Notes  [facility] [place]  [place]  [phone]   [place] [address] [place] or [phone]    Mobile Crisis Teams Organization         Address  Phone  Notes  [facility], Mobile Crisis Care Unit  [phone]   [facility]  [address] [place], [place]   [place] [address] [place] [phone]    Self-Help/Support Groups Organization         Address  Phone             Notes  [place]. of [place] - variety of support groups  [place] Call for more information  Narcotics Anonymous (NA), [facility]aring Services [address], [place]  2 meetings at this location   Special educational needs teacher         Address  Phone  Notes  [facility]AP Residential Treatment [place],    [place]  [phone]   [facility]  [address], [place] [address], [place], [place]   [place], [place] [phone] Admissions: 8am-3pm M-F  Incentives Substance [place] [address],    [place], [place] [phone]   [facility] [address] [place], [place], [place]   [facility] [address]  [place], [place]   [facility] Programs - Intensive Outpatient [address], [place], [place]   [facility].,  [employer], [place] [phone] or [phone]   [facility] Services (RTS) [address], [place], [place] Accepts Medicaid  [facility] [place] [address]  [place] [phone] Substance Abuse/Addiction Treatment   [facility] Organization         Address  Phone  Notes  [facility]  [phone]   [place] Free  Karna Dupes, PhD [address] [place], [place]   [phone] or [phone]   [facility] [place], [place] [phone]   [address], [place], [place] [phone] Insurance/Medicaid/sponsorship through [facility] and Families [address], [address]                                    [place], [place] [phone] [place] [address], [place] [phone]    Dr. Adele Schilder  [phone]   [facility] Dept. 1) [address] [place] 2) [place] 3)  [address], [place] [phone]  [phone]   [place]  [phone] or [phone] (After Hours)

## 2013-12-08 NOTE — ED Provider Notes (Signed)
TIME SEEN: 10:15 AM  CHIEF COMPLAINT: Sinus pressure, chronic headaches, chronic back pain, chronic bilateral knee pain  HPI: Pt is a 58 y.o. male with history of hypertension, depression, chronic pain who presents to the emergency department with multiple complaints. Patient states that for one year he has had a frontal, throbbing headache and sinus drainage. He was seen in the emergency department on 11/23/13 for the same and was sent home on antibiotics and with a steroid nasal spray. He states that he took all the antibiotics but has not had any relief of symptoms. No fevers, neck pain or neck stiffness. No head injury. He is not on anticoagulation. No tick bite. No numbness, tingling or focal weakness. No sudden onset headache.  He is also complaining of bilateral knee pain that has been present for "many years". He states that he has not had any new injury. He is been told that he needs to knee arthroplasty bilaterally. He states that he needs something for pain for this.  He is also complaining of chronic lower back pain. No new injury. Again no neurologic deficits. No history of back surgery or recent epidural injections. No bowel or bladder incontinence. No urinary retention.  He is also concerned about multiple skin lesions that he has had in the past. These appear to be actinic keratosis. 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 No erythema or warmth. No drainage.  ROS: See HPI Constitutional: no fever  Eyes: no drainage  ENT: no runny nose   Cardiovascular:  no chest pain  Resp: no SOB  GI: no vomiting GU: no dysuria Integumentary: no rash  Allergy: no hives  Musculoskeletal: no leg swelling  Neurological: no slurred speech ROS otherwise negative  PAST MEDICAL HISTORY/PAST SURGICAL HISTORY:  Past Medical History  Diagnosis Date  . Hypertension   . Depression   . Cancer     squamous cell "all in my face arms and hands"  . Chronic pain   . Headache(784.0)   . Arthritis   .  Skin cancer     MEDICATIONS:  Prior to Admission medications   Medication Sig Start Date End Date Taking? Authorizing Provider  fluticasone (FLONASE) 50 MCG/ACT nasal spray Place 2 sprays into both nostrils daily. 11/23/13  Yes Shari A Upstill, PA-C  azithromycin (ZITHROMAX) 250 MG tablet Take 1 tablet (250 mg total) by mouth daily. Take first 2 tablets together, then 1 every day until finished. 11/23/13   Shari A Upstill, PA-C  naproxen (NAPROSYN) 500 MG tablet Take 1 tablet (500 mg total) by mouth 2 (two) times daily with a meal. 09/30/13   Margarita Mail, PA-C  traMADol (ULTRAM) 50 MG tablet Take 1 tablet (50 mg total) by mouth every 6 (six) hours as needed. 09/30/13   Margarita Mail, PA-C    ALLERGIES:  No Known Allergies  SOCIAL HISTORY:  History  Substance Use Topics  . Smoking status: Current Some Day Smoker -- 0.50 packs/day for 20 years    Types: Cigarettes  . Smokeless tobacco: Never Used  . Alcohol Use: No    FAMILY HISTORY: Family History  Problem Relation Age of Onset  . Cancer Father   . Diabetes Father   . Hypertension Father     EXAM: BP 134/100  Pulse 80  Temp(Src) 98.8 F (37.1 C) (Oral)  Resp 20  SpO2 98% CONSTITUTIONAL: Alert and oriented and responds appropriately to questions. Well-appearing; well-nourished, well-appearing, nontoxic, in no distress HEAD: Normocephalic EYES: Conjunctivae clear, PERRL ENT: normal  nose; no rhinorrhea; moist mucous membranes; pharynx without lesions noted, patient has tenderness to palpation over his frontal sinus NECK: Supple, no meningismus, no LAD  CARD: RRR; S1 and S2 appreciated; no murmurs, no clicks, no rubs, no gallops RESP: Normal chest excursion without splinting or tachypnea; breath sounds clear and equal bilaterally; no wheezes, no rhonchi, no rales, no respiratory distress or hypoxia ABD/GI: Normal bowel sounds; non-distended; soft, non-tender, no rebound, no guarding BACK:  The back appears normal and is  non-tender to palpation, there is no CVA tenderness, no midline spinal tenderness or step-off or deformity EXT: Tender to palpation over the joint line diffusely and bilateral knees without joint effusion, no erythema or warmth, no ligamentous laxity, full range of motion in both knees, 2+ DP pulses bilaterally, sensation to light touch intact diffusely, no pain over his hips or ankles or feet bilaterally, Normal ROM in all joints; non-tender to palpation; no edema; normal capillary refill; no cyanosis    SKIN: Normal color for age and race; warm; multiple lesions of his skin consistent with actinic keratosis, no signs of cellulitis or abscess, no erythema or warmth, no fluctuance, no induration, no lesions that appear to be melanoma NEURO: Moves all extremities equally, sensation to light touch intact diffusely, cranial nerves II through XII intact, normal gait, 2+ deep tendon reflexes in bilateral upper and lower extremities. PSYCH: The patient's mood and manner are appropriate. Grooming and personal hygiene are appropriate.  MEDICAL DECISION MAKING: Patient here with multiple complaints. I am not concerned for life-threatening illness at this time. I do not feel labs, imaging is indicated at this time. I am not concerned for intracranial hemorrhage, infarct, cavernous sinus thrombosis, meningitis or encephalitis. I'm also not concerned for spinal stenosis, cauda equina, epidural abscess, discitis, transverse myelitis. His headache is consistent with chronic sinusitis as he has tenderness that is reproducible with palpation over his frontal sinus. We'll have patient continue his nasal steroids but will sent home on a Medrol Dosepak which may also help with his chronic knee pain. Have discussed with patient that he needs to followup with her primary care physician for his chronic pain issues. We'll discharge with a small amount of tramadol but have discussed with him that the emergency department is not the  appropriate place to have his chronic pain treated. He verbalizes understanding. We'll have him also followup with his cardiologist for areas of actinic keratosis. Have discussed strict return precautions. Patient verbalizes understanding and is comfortable with plan.       [place], DO 12/08/13 1113

## 2013-12-08 NOTE — ED Notes (Signed)
Patient has multiple complaints.  States he was seen here two weeks ago for a sinus infection and continues to have problems.  States he has bilateral knee pain for years, and for the last one month, the pain has been worse.

## 2013-12-17 ENCOUNTER — Emergency Department (HOSPITAL_COMMUNITY)
Admission: EM | Admit: 2013-12-17 | Discharge: 2013-12-17 | Disposition: A | Discharge status: Home / Self Care | Payer: Medicare Other | Attending: Emergency Medicine | Admitting: Emergency Medicine

## 2013-12-17 ENCOUNTER — Ambulatory Visit: Payer: Medicare Other | Attending: Internal Medicine | Admitting: Internal Medicine

## 2013-12-17 ENCOUNTER — Encounter (HOSPITAL_COMMUNITY): Payer: Self-pay | Admitting: Emergency Medicine

## 2013-12-17 ENCOUNTER — Encounter: Payer: Self-pay | Admitting: Internal Medicine

## 2013-12-17 VITALS — BP 159/117 | HR 70 | Temp 97.6°F | Resp 16 | Ht 69.0 in | Wt 190.0 lb

## 2013-12-17 DIAGNOSIS — Z8739 Personal history of other diseases of the musculoskeletal system and connective tissue: Secondary | ICD-10-CM | POA: Insufficient documentation

## 2013-12-17 DIAGNOSIS — IMO0002 Reserved for concepts with insufficient information to code with codable children: Secondary | ICD-10-CM | POA: Insufficient documentation

## 2013-12-17 DIAGNOSIS — F39 Unspecified mood [affective] disorder: Secondary | ICD-10-CM | POA: Diagnosis not present

## 2013-12-17 DIAGNOSIS — Z85828 Personal history of other malignant neoplasm of skin: Secondary | ICD-10-CM | POA: Diagnosis not present

## 2013-12-17 DIAGNOSIS — J0101 Acute recurrent maxillary sinusitis: Secondary | ICD-10-CM

## 2013-12-17 DIAGNOSIS — I10 Essential (primary) hypertension: Secondary | ICD-10-CM | POA: Insufficient documentation

## 2013-12-17 DIAGNOSIS — G8929 Other chronic pain: Secondary | ICD-10-CM | POA: Diagnosis not present

## 2013-12-17 DIAGNOSIS — M25562 Pain in left knee: Secondary | ICD-10-CM

## 2013-12-17 DIAGNOSIS — F17200 Nicotine dependence, unspecified, uncomplicated: Secondary | ICD-10-CM | POA: Insufficient documentation

## 2013-12-17 DIAGNOSIS — F329 Major depressive disorder, single episode, unspecified: Secondary | ICD-10-CM | POA: Diagnosis not present

## 2013-12-17 DIAGNOSIS — J0100 Acute maxillary sinusitis, unspecified: Secondary | ICD-10-CM | POA: Insufficient documentation

## 2013-12-17 DIAGNOSIS — F3289 Other specified depressive episodes: Secondary | ICD-10-CM | POA: Insufficient documentation

## 2013-12-17 DIAGNOSIS — M25569 Pain in unspecified knee: Secondary | ICD-10-CM | POA: Diagnosis not present

## 2013-12-17 DIAGNOSIS — M25561 Pain in right knee: Secondary | ICD-10-CM

## 2013-12-17 LAB — COMPREHENSIVE METABOLIC PANEL
ALBUMIN: 4.4 g/dL (ref 3.5–5.2)
ALK PHOS: 64 U/L (ref 39–117)
ALT: 20 U/L (ref 0–53)
AST: 17 U/L (ref 0–37)
Anion gap: 12 (ref 5–15)
BUN: 10 mg/dL (ref 6–23)
CHLORIDE: 104 meq/L (ref 96–112)
CO2: 27 mEq/L (ref 19–32)
Calcium: 9.8 mg/dL (ref 8.4–10.5)
Creatinine, Ser: 0.78 mg/dL (ref 0.50–1.35)
GFR calc Af Amer: >90 mL/min (ref >90)
GFR calc non Af Amer: >90 mL/min (ref >90)
Glucose, Bld: 100 mg/dL — ABNORMAL HIGH (ref 70–99)
POTASSIUM: 4.8 meq/L (ref 3.7–5.3)
Sodium: 143 mEq/L (ref 137–147)
TOTAL PROTEIN: 7.2 g/dL (ref 6.0–8.3)
Total Bilirubin: 0.6 mg/dL (ref 0.3–1.2)

## 2013-12-17 LAB — RAPID URINE DRUG SCREEN, HOSP PERFORMED
Amphetamines: NOT DETECTED
BARBITURATES: NOT DETECTED
BENZODIAZEPINES: NOT DETECTED
COCAINE: NOT DETECTED
OPIATES: NOT DETECTED
TETRAHYDROCANNABINOL: NOT DETECTED

## 2013-12-17 LAB — SALICYLATE LEVEL: Salicylate Lvl: <2 mg/dL — ABNORMAL LOW (ref 2.8–20.0)

## 2013-12-17 LAB — CBC
HCT: 45.8 % (ref 39.0–52.0)
Hemoglobin: 15.8 g/dL (ref 13.0–17.0)
MCH: 31.7 pg (ref 26.0–34.0)
MCHC: 34.5 g/dL (ref 30.0–36.0)
MCV: 91.8 fL (ref 78.0–100.0)
Platelets: 221 10*3/uL (ref 150–400)
RBC: 4.99 MIL/uL (ref 4.22–5.81)
RDW: 13.1 % (ref 11.5–15.5)
WBC: 11.8 10*3/uL — ABNORMAL HIGH (ref 4.0–10.5)

## 2013-12-17 LAB — ACETAMINOPHEN LEVEL

## 2013-12-17 LAB — ETHANOL: Alcohol, Ethyl (B): <11 mg/dL (ref 0–11)

## 2013-12-17 MED ORDER — LISINOPRIL-HYDROCHLOROTHIAZIDE 10-12.5 MG PO TABS: 1.0000 | ORAL_TABLET | Freq: Every day | ORAL | Status: DC

## 2013-12-17 MED ORDER — AMOXICILLIN-POT CLAVULANATE 875-125 MG PO TABS: 1.0000 | ORAL_TABLET | Freq: Two times a day (BID) | ORAL | Status: DC

## 2013-12-17 MED ORDER — HYDROCODONE-ACETAMINOPHEN 5-325 MG PO TABS: 1.0000 | ORAL_TABLET | Freq: Four times a day (QID) | ORAL | Status: DC | PRN

## 2013-12-17 MED ORDER — PREDNISONE 50 MG PO TABS: 50.0000 mg | ORAL_TABLET | Freq: Every day | ORAL | Status: DC

## 2013-12-17 NOTE — Progress Notes (Signed)
Patient ID: Don [name], male   DOB: Oct 09, 1955, 58 y.o.   MRN: [id number]  YQM:[id number]  [id number]:[id number]  DOB - 04-13-1956  CC:  Chief Complaint  Patient presents with  . Establish Care  . Depression  . Hypertension  . Knee Pain       HPI: Don [name] is a 58 y.o. male here today to establish medical care. Patient presents to clinic today with c/o of hearing voices.  Patient reports that he has a history of major depressive disorder and has tried to "cut cancer out of his skin in the past".  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  He reports that he feel unstable and knows that he needs care for his mental health asap.  Patient is very tearful during exam and states that he feel as if he needs to be evaluated today for psychiatric instability. Patient reports a 3 generation history of suicide. He references suicide during exam but then denies plan or intent.     He reports a history of skin cancer and was being seen by MD in [facility] but he is unable to get back to that doctor.  Patient reports that he has also not been able to see his pychatrist in [place] as well.   No Known Allergies Past Medical History  Diagnosis Date  . Hypertension   . Depression   . Cancer     squamous cell "all in my face arms and hands"  . Chronic pain   . Headache(784.0)   . Arthritis   . Skin cancer    Current Outpatient Prescriptions on File Prior to Visit  Medication Sig Dispense Refill  . traMADol (ULTRAM) 50 MG tablet Take 1 tablet (50 mg total) by mouth every 6 (six) hours as needed.  15 tablet  0  . azithromycin (ZITHROMAX) 250 MG tablet Take 1 tablet (250 mg total) by mouth daily. Take first 2 tablets together, then 1 every day until finished.  6 tablet  0  . fluticasone (FLONASE) 50 MCG/ACT nasal spray Place 2 sprays into both nostrils daily.  16 g  0  . methylPREDNIsolone (MEDROL DOSPACK) 4 MG tablet follow package directions  21 tablet  0  .  naproxen (NAPROSYN) 500 MG tablet Take 1 tablet (500 mg total) by mouth 2 (two) times daily with a meal.  30 tablet  0  . traMADol (ULTRAM) 50 MG tablet Take 1 tablet (50 mg total) by mouth every 6 (six) hours as needed.  60 tablet  0   No current facility-administered medications on file prior to visit.   Family History  Problem Relation Age of Onset  . Cancer Father   . Diabetes Father   . Hypertension Father    History   Social History  . Marital Status: Single    Spouse Name: N/A    Number of Children: N/A  . Years of Education: N/A   Occupational History  . Not on file.   Social History Main Topics  . Smoking status: Current Some Day Smoker -- 0.50 packs/day for 20 years    Types: Cigarettes  . Smokeless tobacco: Never Used  . Alcohol Use: No  . Drug Use: No  . Sexual Activity: Not on file   Other Topics Concern  . Not on file   Social History Narrative  . No narrative on file    Review of Systems: Constitutional: Negative for fever, chills, diaphoresis, activity  change, appetite change and fatigue. HENT: Negative for ear pain, nosebleeds, congestion, facial swelling, rhinorrhea, neck pain, neck stiffness and ear discharge.  Eyes: Negative for pain, discharge, redness, itching and visual disturbance. Respiratory: Negative for cough, choking, chest tightness, shortness of breath, wheezing and stridor.  Cardiovascular: Negative for chest pain, palpitations and leg swelling. Gastrointestinal: Negative for abdominal distention. Genitourinary: Negative for dysuria, urgency, frequency, hematuria, flank pain, decreased urine volume, difficulty urinating and dyspareunia.  Musculoskeletal: Negative for back pain, joint swelling, arthralgia and gait problem. Neurological: Negative for dizziness, tremors, seizures, syncope, facial asymmetry, speech difficulty, weakness, light-headedness, numbness and headaches.  Hematological: Negative for adenopathy. Does not bruise/bleed  easily. Psychiatric/Behavioral: Negative for hallucinations, behavioral problems, confusion, dysphoric mood, decreased concentration and agitation.    Objective:   Filed Vitals:   12/17/13 1607  BP: 159/117  Pulse: 70  Temp: 97.6 F (36.4 C)  Resp: 16    Physical Exam: Constitutional: Patient appears well-developed and well-nourished. No distress. HENT: Normocephalic, atraumatic, External right and left ear normal. Oropharynx is clear and moist.  Eyes: Conjunctivae and EOM are normal. PERRLA, no scleral icterus. Neck: Normal ROM. Neck supple. No JVD. No tracheal deviation. No thyromegaly. CVS: RRR, S1/S2 +, no murmurs, no gallops, no carotid bruit.  Pulmonary: Effort and breath sounds normal, no stridor, rhonchi, wheezes, rales.  Abdominal: Soft. BS +, no distension, tenderness, rebound or guarding.  Musculoskeletal: Normal range of motion. No edema and no tenderness.  Lymphadenopathy: No lymphadenopathy noted, cervical, inguinal or axillary Neuro: Alert. Normal reflexes, muscle tone coordination. No cranial nerve deficit. Skin: Skin is warm and dry. No rash noted. Not diaphoretic. No erythema. No pallor. Psychiatric: Normal mood and affect. Behavior, judgment, thought content normal.  Lab Results  Component Value Date   WBC 15.4* 07/19/2011   HGB 16.3 01/17/2012   HCT 48.0 01/17/2012   MCV 87.6 07/19/2011   PLT 122* 07/19/2011   Lab Results  Component Value Date   CREATININE 0.90 01/17/2012   BUN 4* 01/17/2012   NA 143 01/17/2012   K 3.9 01/17/2012   CL 105 01/17/2012   CO2 25 07/19/2011    No results found for this basename: HGBA1C   Lipid Panel  No results found for this basename: chol, trig, hdl, cholhdl, vldl, ldlcalc       Assessment and plan:   Don [name] was seen today for establish care, depression, hypertension and knee pain.  Diagnoses and associated orders for this visit:  Essential hypertension lisinopril-hydrochlorothiazide (PRINZIDE,ZESTORETIC) 10-12.5 MG  per tablet; Take 1 tablet by mouth daily.  History of skin cancer - Ambulatory referral to Dermatology  Episodic mood disorder Patient will be sent to ER for evaluation, patient will be accompanied by staff member to ER.     Return in about 2 weeks (around 12/31/2013).   Chari Manning, NP-C [facility] [phone] 12/26/2013, 10:05 PM

## 2013-12-17 NOTE — ED Notes (Signed)
MD at bedside. 

## 2013-12-17 NOTE — ED Provider Notes (Signed)
CSN: [id number]     Arrival date & time 12/17/13  1644 History   First MD Initiated Contact with Patient 12/17/13 1737     Chief Complaint  Patient presents with  . Depression  . Knee Pain  . Recurrent Sinusitis     (Consider location/radiation/quality/duration/timing/severity/associated sxs/prior Treatment) HPI Patient presents to the emergency department with chronic left knee pain and continued sinus problems.  The patient, states he was seen [facility] for sinus infection and given a Z-Pak.  He should states he has not improved since finishing the medication.  Patient, states, that he is out of his Klonopin for several months, and that's why he feels depressed.  Patient denies suicidal or homicidal ideation.  The patient denies chest pain, shortness of breath, weakness, dizziness, blurred vision, fever, cough, rhinorrhea, sore throat, back pain, neck pain, rash, or syncope.  The patient, states, that he didn't take any other medications prior to arrival. Past Medical History  Diagnosis Date  . Hypertension   . Depression   . Cancer     squamous cell "all in my face arms and hands"  . Chronic pain   . Headache(784.0)   . Arthritis   . Skin cancer    Past Surgical History  Procedure Laterality Date  . Skin cancer excision    . Left wrist    . Cartilage removed from both knees     Family History  Problem Relation Age of Onset  . Cancer Father   . Diabetes Father   . Hypertension Father    History  Substance Use Topics  . Smoking status: Current Some Day Smoker -- 0.50 packs/day for 20 years    Types: Cigarettes  . Smokeless tobacco: Never Used  . Alcohol Use: No    Review of Systems  All other systems negative except as documented in the HPI. All pertinent positives and negatives as reviewed in the HPI.  Allergies  Review of patient's allergies indicates no known allergies.  Home Medications   Prior to Admission medications   Medication Sig Start  Date End Date Taking? Authorizing Provider  fluticasone (FLONASE) 50 MCG/ACT nasal spray Place 2 sprays into both nostrils daily. 11/23/13  Yes Shari A Upstill, PA-C   BP 167/95  Pulse 78  Temp(Src) 98.3 F (36.8 C) (Oral)  Resp 16  Ht 5\' 10"  (1.778 m)  Wt 190 lb (86.183 kg)  BMI 27.26 kg/m2  SpO2 98% Physical Exam  Nursing note and vitals reviewed. Constitutional: He appears well-developed and well-nourished. No distress.  HENT:  Head: Normocephalic and atraumatic.  Eyes: Pupils are equal, round, and reactive to light.  Neck: Normal range of motion. Neck supple.  Cardiovascular: Normal rate, regular rhythm and normal heart sounds.  Exam reveals no gallop and no friction rub.   No murmur heard. Pulmonary/Chest: Effort normal and breath sounds normal. No respiratory distress.  Musculoskeletal:       Left knee: He exhibits no erythema and no bony tenderness. Tenderness found.  Skin: Skin is warm and dry.  Psychiatric: He has a normal mood and affect. His behavior is normal. Judgment and thought content normal.    ED Course  Procedures (including critical care time) Labs Review Labs Reviewed  CBC - Abnormal; Notable for the following:    WBC 11.8 (*)    All other components within normal limits  COMPREHENSIVE METABOLIC PANEL - Abnormal; Notable for the following:    Glucose, Bld 100 (*)  All other components within normal limits  SALICYLATE LEVEL - Abnormal; Notable for the following:    Salicylate Lvl <0.1 (*)    All other components within normal limits  ACETAMINOPHEN LEVEL  ETHANOL  URINE RAPID DRUG SCREEN (HOSP PERFORMED)    Patient will be discharged and advised followup with his primary care Dr. for further evaluation of his chronic depression.  The patient is not suicidal  Brent General, [place] 12/17/13 1834

## 2013-12-17 NOTE — Progress Notes (Signed)
Pt here to establish care for c/o chronic bilateral knee pain with worsening. States last imaging showed the need of knee replacement C/o greater pain in left knee Pt is very tearful. States he is very depressed everyday. Pt was being treated at [facility] by Dr. Joellyn Quails and prescribed Klonopin Not taking no medications BP elevated 159/117 70

## 2013-12-17 NOTE — Discharge Instructions (Signed)
Return here as needed.  Followup with your primary care Dr. use ice and heat on your knees

## 2013-12-17 NOTE — ED Notes (Signed)
He states "ive been out of my depression meds for 8 months. im also battling a sinus infection and major R knee pain. i need a knee replacement. im not suicidal or anything im just worried about my mood being off my meds." denies HI. hes A&Ox4, resp e/u

## 2013-12-18 NOTE — ED Provider Notes (Signed)
Medical screening examination/treatment/procedure(s) were performed by non-physician practitioner and as supervising physician I was immediately available for consultation/collaboration.  Richarda Blade, MD 12/18/13 [phone]

## 2013-12-20 ENCOUNTER — Ambulatory Visit: Payer: Medicare Other

## 2013-12-20 MED ORDER — DICLOFENAC SODIUM 1 % TD GEL: 2.0000 g | Freq: Four times a day (QID) | TRANSDERMAL | Status: DC

## 2013-12-20 NOTE — Progress Notes (Unsigned)
Patient here for medication for knee pain. Patient was seen on 12/18/2013 by Roney Jaffe, NP but sent to ED. Patient states he was not prescribed anything for his knee pain in ED. Consulted with Roney Jaffe, NP who gave a verbal order for Voltaren Gel 1%. Vivia Birmingham, RN

## 2013-12-20 NOTE — Patient Instructions (Signed)
Knee Pain The knee is the complex joint between your thigh and your lower leg. It is made up of bones, tendons, ligaments, and cartilage. The bones that make up the knee are:  The femur in the thigh.  The tibia and fibula in the lower leg.  The patella or kneecap riding in the groove on the lower femur. CAUSES  Knee pain is a common complaint with many causes. A few of these causes are:  Injury, such as:  A ruptured ligament or tendon injury.  Torn cartilage.  Medical conditions, such as:  Gout  Arthritis  Infections  Overuse, over training, or overdoing a physical activity. Knee pain can be minor or severe. Knee pain can accompany debilitating injury. Minor knee problems often respond well to self-care measures or get well on their own. More serious injuries may need medical intervention or even surgery. SYMPTOMS The knee is complex. Symptoms of knee problems can vary widely. Some of the problems are:  Pain with movement and weight bearing.  Swelling and tenderness.  Buckling of the knee.  Inability to straighten or extend your knee.  Your knee locks and you cannot straighten it.  Warmth and redness with pain and fever.  Deformity or dislocation of the kneecap. DIAGNOSIS  Determining what is wrong may be very straight forward such as when there is an injury. It can also be challenging because of the complexity of the knee. Tests to make a diagnosis may include:  Your caregiver taking a history and doing a physical exam.  Routine X-rays can be used to rule out other problems. X-rays will not reveal a cartilage tear. Some injuries of the knee can be diagnosed by:  Arthroscopy a surgical technique by which a small video camera is inserted through tiny incisions on the sides of the knee. This procedure is used to examine and repair internal knee joint problems. Tiny instruments can be used during arthroscopy to repair the torn knee cartilage (meniscus).  Arthrography  is a radiology technique. A contrast liquid is directly injected into the knee joint. Internal structures of the knee joint then become visible on X-ray film.  An MRI scan is a non X-ray radiology procedure in which magnetic fields and a computer produce two- or three-dimensional images of the inside of the knee. Cartilage tears are often visible using an MRI scanner. MRI scans have largely replaced arthrography in diagnosing cartilage tears of the knee.  Blood work.  Examination of the fluid that helps to lubricate the knee joint (synovial fluid). This is done by taking a sample out using a needle and a syringe. TREATMENT The treatment of knee problems depends on the cause. Some of these treatments are:  Depending on the injury, proper casting, splinting, surgery, or physical therapy care will be needed.  Give yourself adequate recovery time. Do not overuse your joints. If you begin to get sore during workout routines, back off. Slow down or do fewer repetitions.  For repetitive activities such as cycling or running, maintain your strength and nutrition.  Alternate muscle groups. For example, if you are a weight lifter, work the upper body on one day and the lower body the next.  Either tight or weak muscles do not give the proper support for your knee. Tight or weak muscles do not absorb the stress placed on the knee joint. Keep the muscles surrounding the knee strong.  Take care of mechanical problems.  If you have flat feet, orthotics or special shoes may help.  See your caregiver if you need help.  Arch supports, sometimes with wedges on the inner or outer aspect of the heel, can help. These can shift pressure away from the side of the knee most bothered by osteoarthritis.  A brace called an "unloader" brace also may be used to help ease the pressure on the most arthritic side of the knee.  If your caregiver has prescribed crutches, braces, wraps or ice, use as directed. The acronym  for this is PRICE. This means protection, rest, ice, compression, and elevation.  Nonsteroidal anti-inflammatory drugs (NSAIDs), can help relieve pain. But if taken immediately after an injury, they may actually increase swelling. Take NSAIDs with food in your stomach. Stop them if you develop stomach problems. Do not take these if you have a history of ulcers, stomach pain, or bleeding from the bowel. Do not take without your caregiver's approval if you have problems with fluid retention, heart failure, or kidney problems.  For ongoing knee problems, physical therapy may be helpful.  Glucosamine and chondroitin are over-the-counter dietary supplements. Both may help relieve the pain of osteoarthritis in the knee. These medicines are different from the usual anti-inflammatory drugs. Glucosamine may decrease the rate of cartilage destruction.  Injections of a corticosteroid drug into your knee joint may help reduce the symptoms of an arthritis flare-up. They may provide pain relief that lasts a few months. You may have to wait a few months between injections. The injections do have a small increased risk of infection, water retention, and elevated blood sugar levels.  Hyaluronic acid injected into damaged joints may ease pain and provide lubrication. These injections may work by reducing inflammation. A series of shots may give relief for as long as 6 months.  Topical painkillers. Applying certain ointments to your skin may help relieve the pain and stiffness of osteoarthritis. Ask your pharmacist for suggestions. Many over the-counter products are approved for temporary relief of arthritis pain.  In some countries, doctors often prescribe topical NSAIDs for relief of chronic conditions such as arthritis and tendinitis. A review of treatment with NSAID creams found that they worked as well as oral medications but without the serious side effects. PREVENTION  Maintain a healthy weight. Extra pounds  put more strain on your joints.  Get strong, stay limber. Weak muscles are a common cause of knee injuries. Stretching is important. Include flexibility exercises in your workouts.  Be smart about exercise. If you have osteoarthritis, chronic knee pain or recurring injuries, you may need to change the way you exercise. This does not mean you have to stop being active. If your knees ache after jogging or playing basketball, consider switching to swimming, water aerobics, or other low-impact activities, at least for a few days a week. Sometimes limiting high-impact activities will provide relief.  Make sure your shoes fit well. Choose footwear that is right for your sport.  Protect your knees. Use the proper gear for knee-sensitive activities. Use kneepads when playing volleyball or laying carpet. Buckle your seat belt every time you drive. Most shattered kneecaps occur in car accidents.  Rest when you are tired. SEEK MEDICAL CARE IF:  You have knee pain that is continual and does not seem to be getting better.  SEEK IMMEDIATE MEDICAL CARE IF:  Your knee joint feels hot to the touch and you have a high fever. MAKE SURE YOU:   Understand these instructions.  Will watch your condition.  Will get help right away if you are not  doing well or get worse. Document Released: 03/17/2007 Document Revised: 08/12/2011 Document Reviewed: 03/17/2007 [facility] Patient Information 2015 [place], [place]. This information is not intended to replace advice given to you by your health care provider. Make sure you discuss any questions you have with your health care provider. Hypertension Hypertension, commonly called high blood pressure, is when the force of blood pumping through your arteries is too strong. Your arteries are the blood vessels that carry blood from your heart throughout your body. A blood pressure reading consists of a higher number over a lower number, such as 110/72. The higher number (systolic)  is the pressure inside your arteries when your heart pumps. The lower number (diastolic) is the pressure inside your arteries when your heart relaxes. Ideally you want your blood pressure below 120/80. Hypertension forces your heart to work harder to pump blood. Your arteries may become narrow or stiff. Having hypertension puts you at risk for heart disease, stroke, and other problems.  RISK FACTORS Some risk factors for high blood pressure are controllable. Others are not.  Risk factors you cannot control include:   Race. You may be at higher risk if you are African American.  Age. Risk increases with age.  Gender. Men are at higher risk than women before age 80 years. After age 28, women are at higher risk than men. Risk factors you can control include:  Not getting enough exercise or physical activity.  Being overweight.  Getting too much fat, sugar, calories, or salt in your diet.  Drinking too much alcohol. SIGNS AND SYMPTOMS Hypertension does not usually cause signs or symptoms. Extremely high blood pressure (hypertensive crisis) may cause headache, anxiety, shortness of breath, and nosebleed. DIAGNOSIS  To check if you have hypertension, your health care provider will measure your blood pressure while you are seated, with your arm held at the level of your heart. It should be measured at least twice using the same arm. Certain conditions can cause a difference in blood pressure between your right and left arms. A blood pressure reading that is higher than normal on one occasion does not mean that you need treatment. If one blood pressure reading is high, ask your health care provider about having it checked again. TREATMENT  Treating high blood pressure includes making lifestyle changes and possibly taking medication. Living a healthy lifestyle can help lower high blood pressure. You may need to change some of your habits. Lifestyle changes may include:  Following the DASH diet.  This diet is high in fruits, vegetables, and whole grains. It is low in salt, red meat, and added sugars.  Getting at least 2 1/2 hours of brisk physical activity every week.  Losing weight if necessary.  Not smoking.  Limiting alcoholic beverages.  Learning ways to reduce stress. If lifestyle changes are not enough to get your blood pressure under control, your health care provider may prescribe medicine. You may need to take more than one. Work closely with your health care provider to understand the risks and benefits. HOME CARE INSTRUCTIONS  Have your blood pressure rechecked as directed by your health care provider.   Only take medicine as directed by your health care provider. Follow the directions carefully. Blood pressure medicines must be taken as prescribed. The medicine does not work as well when you skip doses. Skipping doses also puts you at risk for problems.   Do not smoke.   Monitor your blood pressure at home as directed by your health care provider.  SEEK MEDICAL CARE IF:   You think you are having a reaction to medicines taken.  You have recurrent headaches or feel dizzy.  You have swelling in your ankles.  You have trouble with your vision. SEEK IMMEDIATE MEDICAL CARE IF:  You develop a severe headache or confusion.  You have unusual weakness, numbness, or feel faint.  You have severe chest or abdominal pain.  You vomit repeatedly.  You have trouble breathing. MAKE SURE YOU:   Understand these instructions.  Will watch your condition.  Will get help right away if you are not doing well or get worse. Document Released: 05/20/2005 Document Revised: 05/25/2013 Document Reviewed: 03/12/2013 [facility] Patient Information 2015 [place], [place]. This information is not intended to replace advice given to you by your health care provider. Make sure you discuss any questions you have with your health care provider.

## 2014-02-07 DIAGNOSIS — M179 Osteoarthritis of knee, unspecified: Secondary | ICD-10-CM | POA: Insufficient documentation

## 2014-02-07 DIAGNOSIS — I10 Essential (primary) hypertension: Secondary | ICD-10-CM | POA: Insufficient documentation

## 2014-02-07 DIAGNOSIS — Q849 Congenital malformation of integument, unspecified: Secondary | ICD-10-CM | POA: Insufficient documentation

## 2014-02-07 DIAGNOSIS — L989 Disorder of the skin and subcutaneous tissue, unspecified: Secondary | ICD-10-CM | POA: Insufficient documentation

## 2014-02-07 DIAGNOSIS — M1990 Unspecified osteoarthritis, unspecified site: Secondary | ICD-10-CM | POA: Insufficient documentation

## 2014-02-07 DIAGNOSIS — M259 Joint disorder, unspecified: Secondary | ICD-10-CM | POA: Insufficient documentation

## 2014-04-18 ENCOUNTER — Encounter (HOSPITAL_COMMUNITY): Payer: Self-pay | Admitting: Emergency Medicine

## 2014-04-18 ENCOUNTER — Emergency Department (HOSPITAL_COMMUNITY)
Admission: EM | Admit: 2014-04-18 | Discharge: 2014-04-18 | Disposition: A | Discharge status: Home / Self Care | Payer: Medicare Other | Attending: Emergency Medicine | Admitting: Emergency Medicine

## 2014-04-18 DIAGNOSIS — Z720 Tobacco use: Secondary | ICD-10-CM | POA: Insufficient documentation

## 2014-04-18 DIAGNOSIS — Z7951 Long term (current) use of inhaled steroids: Secondary | ICD-10-CM | POA: Diagnosis not present

## 2014-04-18 DIAGNOSIS — G8929 Other chronic pain: Secondary | ICD-10-CM | POA: Insufficient documentation

## 2014-04-18 DIAGNOSIS — Z85828 Personal history of other malignant neoplasm of skin: Secondary | ICD-10-CM | POA: Insufficient documentation

## 2014-04-18 DIAGNOSIS — R51 Headache: Secondary | ICD-10-CM | POA: Insufficient documentation

## 2014-04-18 DIAGNOSIS — M25561 Pain in right knee: Secondary | ICD-10-CM | POA: Insufficient documentation

## 2014-04-18 DIAGNOSIS — Z791 Long term (current) use of non-steroidal anti-inflammatories (NSAID): Secondary | ICD-10-CM | POA: Diagnosis not present

## 2014-04-18 DIAGNOSIS — M1990 Unspecified osteoarthritis, unspecified site: Secondary | ICD-10-CM | POA: Diagnosis not present

## 2014-04-18 DIAGNOSIS — I10 Essential (primary) hypertension: Secondary | ICD-10-CM | POA: Insufficient documentation

## 2014-04-18 DIAGNOSIS — Z8659 Personal history of other mental and behavioral disorders: Secondary | ICD-10-CM | POA: Insufficient documentation

## 2014-04-18 DIAGNOSIS — Z792 Long term (current) use of antibiotics: Secondary | ICD-10-CM | POA: Diagnosis not present

## 2014-04-18 DIAGNOSIS — Z7952 Long term (current) use of systemic steroids: Secondary | ICD-10-CM | POA: Diagnosis not present

## 2014-04-18 DIAGNOSIS — M25562 Pain in left knee: Secondary | ICD-10-CM | POA: Diagnosis not present

## 2014-04-18 MED ORDER — OXYCODONE-ACETAMINOPHEN 5-325 MG PO TABS
1.0000 | ORAL_TABLET | Freq: Once | ORAL | Status: AC
  Administered 2014-04-18: 1 via ORAL
  Filled 2014-04-18: qty 1

## 2014-04-18 NOTE — Discharge Instructions (Signed)
Read the information below.  You may return to the Emergency Department at any time for worsening condition or any new symptoms that concern you.    Chronic Pain Discharge Instructions  Emergency care providers appreciate that many patients coming to [place] are in severe pain and we wish to address their pain in the safest, most responsible manner.  It is important to recognize however, that the proper treatment of chronic pain differs from that of the pain of injuries and acute illnesses.  Our goal is to provide quality, safe, personalized care and we thank you for giving [place] the opportunity to serve you. The use of narcotics and related agents for chronic pain syndromes may lead to additional physical and psychological problems.  Nearly as many people die from prescription narcotics each year as die from car crashes.  Additionally, this risk is increased if such prescriptions are obtained from a variety of sources.  Therefore, only your primary care physician or a pain management specialist is able to safely treat such syndromes with narcotic medications long-term.    Documentation revealing such prescriptions have been sought from multiple sources may prohibit [place] from providing a refill or different narcotic medication.  Your name may be checked first through the [facility].  This database is a record of controlled substance medication prescriptions that the patient has received.  This has been established by [facility] in an effort to eliminate the dangerous, and often life threatening, practice of obtaining multiple prescriptions from different medical providers.   If you have a chronic pain syndrome (i.e. chronic headaches, recurrent back or neck pain, dental pain, abdominal or pelvis pain without a specific diagnosis, or neuropathic pain such as fibromyalgia) or recurrent visits for the same condition without an acute diagnosis, you may be treated with  non-narcotics and other non-addictive medicines.  Allergic reactions or negative side effects that may be reported by a patient to such medications will not typically lead to the use of a narcotic analgesic or other controlled substance as an alternative.   Patients managing chronic pain with a personal physician should have provisions in place for breakthrough pain.  If you are in crisis, you should call your physician.  If your physician directs you to the emergency department, please have the doctor call and speak to our attending physician concerning your care.   When patients come to the Emergency Department (ED) with acute medical conditions in which the Emergency Department physician feels appropriate to prescribe narcotic or sedating pain medication, the physician will prescribe these in very limited quantities.  The amount of these medications will last only until you can see your primary care physician in his/her office.  Any patient who returns to the ED seeking refills should expect only non-narcotic pain medications.   In the event of an acute medical condition exists and the emergency physician feels it is necessary that the patient be given a narcotic or sedating medication -  a responsible adult driver should be present in the room prior to the medication being given by the nurse.   Prescriptions for narcotic or sedating medications that have been lost, stolen or expired will not be refilled in the Emergency Department.    Patients who have chronic pain may receive non-narcotic prescriptions until seen by their primary care physician.  It is every patients personal responsibility to maintain active prescriptions with his or her primary care physician or specialist.   Chronic Pain Chronic pain can  be defined as pain that is off and on and lasts for 3-6 months or longer. Many things cause chronic pain, which can make it difficult to make a diagnosis. There are many treatment options  available for chronic pain. However, finding a treatment that works well for you may require trying various approaches until the right one is found. Many people benefit from a combination of two or more types of treatment to control their pain. SYMPTOMS  Chronic pain can occur anywhere in the body and can range from mild to very severe. Some types of chronic pain include:  Headache.  Low back pain.  Cancer pain.  Arthritis pain.  Neurogenic pain. This is pain resulting from damage to nerves. People with chronic pain may also have other symptoms such as:  Depression.  Anger.  Insomnia.  Anxiety. DIAGNOSIS  Your health care provider will help diagnose your condition over time. In many cases, the initial focus will be on excluding possible conditions that could be causing the pain. Depending on your symptoms, your health care provider may order tests to diagnose your condition. Some of these tests may include:   Blood tests.   CT scan.   MRI.   X-rays.   Ultrasounds.   Nerve conduction studies.  You may need to see a specialist.  TREATMENT  Finding treatment that works well may take time. You may be referred to a pain specialist. He or she may prescribe medicine or therapies, such as:   Mindful meditation or yoga.  Shots (injections) of numbing or pain-relieving medicines into the spine or area of pain.  Local electrical stimulation.  Acupuncture.   Massage therapy.   Aroma, color, light, or sound therapy.   Biofeedback.   Working with a physical therapist to keep from getting stiff.   Regular, gentle exercise.   Cognitive or behavioral therapy.   Group support.  Sometimes, surgery may be recommended.  HOME CARE INSTRUCTIONS   Take all medicines as directed by your health care provider.   Lessen stress in your life by relaxing and doing things such as listening to calming music.   Exercise or be active as directed by your health care  provider.   Eat a healthy diet and include things such as vegetables, fruits, fish, and lean meats in your diet.   Keep all follow-up appointments with your health care provider.   Attend a support group with others suffering from chronic pain. SEEK MEDICAL CARE IF:   Your pain gets worse.   You develop a new pain that was not there before.   You cannot tolerate medicines given to you by your health care provider.   You have new symptoms since your last visit with your health care provider.  SEEK IMMEDIATE MEDICAL CARE IF:   You feel weak.   You have decreased sensation or numbness.   You lose control of bowel or bladder function.   Your pain suddenly gets much worse.   You develop shaking.  You develop chills.  You develop confusion.  You develop chest pain.  You develop shortness of breath.  MAKE SURE YOU:  Understand these instructions.  Will watch your condition.  Will get help right away if you are not doing well or get worse. Document Released: 02/09/2002 Document Revised: 01/20/2013 Document Reviewed: 11/13/2012 [facility] Patient Information 2015 [place], [place]. This information is not intended to replace advice given to you by your health care provider. Make sure you discuss any questions you have with  your health care provider.

## 2014-04-18 NOTE — ED Provider Notes (Signed)
CSN: [id number]     Arrival date & time 04/18/14  1301 History  This chart was scribed for non-physician practitioner, Clayton Bibles, PA-C working with Wandra Arthurs, MD by Frederich Balding, ED scribe. This patient was seen in room WTR8/WTR8 and the patient's care was started at 1:43 PM.   Chief Complaint  Patient presents with  . Knee Pain   The history is provided by the patient. No language interpreter was used.    HPI Comments: Don [name] is a 58 y.o. male who presents to the Emergency Department complaining of chronic bilateral knee pain. States he sees Dr. Percell Miller for this; the last time being 6 months ago. Pt states he was told he needed bilateral knee replacements. Has had multiple xrays and states the problem is well documented and well known by Dr Percell Miller.  Denies recent fall or injury. Bearing weight and certain movements worsen the pain. He has taken percocet in the past with relief of pain. Denies fever.   Past Medical History  Diagnosis Date  . Hypertension   . Depression   . Cancer     squamous cell "all in my face arms and hands"  . Chronic pain   . Headache(784.0)   . Arthritis   . Skin cancer    Past Surgical History  Procedure Laterality Date  . Skin cancer excision    . Left wrist    . Cartilage removed from both knees     Family History  Problem Relation Age of Onset  . Cancer Father   . Diabetes Father   . Hypertension Father    History  Substance Use Topics  . Smoking status: Current Some Day Smoker -- 0.50 packs/day for 20 years    Types: Cigarettes  . Smokeless tobacco: Never Used  . Alcohol Use: No    Review of Systems  Constitutional: Negative for fever.  Musculoskeletal: Positive for arthralgias.  All other systems reviewed and are negative.  Allergies  Review of patient's allergies indicates no known allergies.  Home Medications   Prior to Admission medications   Medication Sig Start Date End Date Taking? Authorizing Provider   amoxicillin-clavulanate (AUGMENTIN) 875-125 MG per tablet Take 1 tablet by mouth every 12 (twelve) hours. 12/17/13   Resa Miner Lawyer, PA-C  diclofenac sodium (VOLTAREN) 1 % GEL Apply 2 g topically 4 (four) times daily. 12/20/13   Lance Bosch, NP  fluticasone (FLONASE) 50 MCG/ACT nasal spray Place 2 sprays into both nostrils daily. 11/23/13   Shari A Upstill, PA-C  HYDROcodone-acetaminophen (NORCO/VICODIN) 5-325 MG per tablet Take 1 tablet by mouth every 6 (six) hours as needed for moderate pain. 12/17/13   Resa Miner Lawyer, PA-C  predniSONE (DELTASONE) 50 MG tablet Take 1 tablet (50 mg total) by mouth daily. 12/17/13   Resa Miner Lawyer, PA-C   BP 134/70 mmHg  Pulse 79  Temp(Src) 97.8 F (36.6 C) (Oral)  Resp 20  SpO2 100%   Physical Exam  Constitutional: He appears well-developed and well-nourished. No distress.  HENT:  Head: Normocephalic and atraumatic.  Neck: Neck supple.  Pulmonary/Chest: Effort normal.  Musculoskeletal:  Bilateral chronic bony enlargement of the knees. No erythema, edema, warmth, discharge, or tenderness. Distal pulses and sensation intact. Full active ROM of bilateral knees.   Neurological: He is alert.  Skin: He is not diaphoretic.  Nursing note and vitals reviewed.   ED Course  Procedures (including critical care time)  DIAGNOSTIC STUDIES: Oxygen Saturation is 100% on RA,  normal by my interpretation.    COORDINATION OF CARE: 1:45 PM-Discussed treatment plan which includes pain medication in the ED with pt at bedside and pt agreed to plan.   Labs Review Labs Reviewed - No data to display  Imaging Review No results found.   EKG Interpretation None      MDM   Final diagnoses:  Bilateral chronic knee pain   Afebrile, nontoxic patient with chronic bilateral knee pain.  No new injury or change in pain.  No e/o septic joint.  Exam unremarkable.   D/C home with no prescriptions, ortho and PCP follow up.  1 percocet given in ED for pain.    Discussed result, findings, treatment, and follow up  with patient.  Pt given return precautions.  Pt verbalizes understanding and agrees with plan.       I personally performed the services described in this documentation, which was scribed in my presence. The recorded information has been reviewed and is accurate.  Clayton Bibles, PA-C 04/18/14 Tipton Yao, MD 04/18/14 [phone]

## 2014-04-18 NOTE — ED Notes (Signed)
Pt c/o bilateral knee pain, stating that they both have to have knee replacements.

## 2014-04-18 NOTE — ED Notes (Signed)
Pt has taken med before.  Will d/c home.

## 2014-10-09 ENCOUNTER — Emergency Department (HOSPITAL_BASED_OUTPATIENT_CLINIC_OR_DEPARTMENT_OTHER)
Admission: EM | Admit: 2014-10-09 | Discharge: 2014-10-09 | Disposition: A | Discharge status: Home / Self Care | Payer: Medicare Other | Attending: Emergency Medicine | Admitting: Emergency Medicine

## 2014-10-09 ENCOUNTER — Encounter (HOSPITAL_BASED_OUTPATIENT_CLINIC_OR_DEPARTMENT_OTHER): Payer: Self-pay

## 2014-10-09 DIAGNOSIS — Z85828 Personal history of other malignant neoplasm of skin: Secondary | ICD-10-CM | POA: Insufficient documentation

## 2014-10-09 DIAGNOSIS — Z8659 Personal history of other mental and behavioral disorders: Secondary | ICD-10-CM | POA: Insufficient documentation

## 2014-10-09 DIAGNOSIS — K130 Diseases of lips: Secondary | ICD-10-CM | POA: Insufficient documentation

## 2014-10-09 DIAGNOSIS — Z791 Long term (current) use of non-steroidal anti-inflammatories (NSAID): Secondary | ICD-10-CM | POA: Insufficient documentation

## 2014-10-09 DIAGNOSIS — Z720 Tobacco use: Secondary | ICD-10-CM | POA: Insufficient documentation

## 2014-10-09 DIAGNOSIS — I10 Essential (primary) hypertension: Secondary | ICD-10-CM | POA: Diagnosis not present

## 2014-10-09 DIAGNOSIS — G8929 Other chronic pain: Secondary | ICD-10-CM | POA: Diagnosis not present

## 2014-10-09 DIAGNOSIS — L0201 Cutaneous abscess of face: Secondary | ICD-10-CM | POA: Diagnosis present

## 2014-10-09 DIAGNOSIS — M1990 Unspecified osteoarthritis, unspecified site: Secondary | ICD-10-CM | POA: Diagnosis not present

## 2014-10-09 DIAGNOSIS — Z7951 Long term (current) use of inhaled steroids: Secondary | ICD-10-CM | POA: Insufficient documentation

## 2014-10-09 MED ORDER — SULFAMETHOXAZOLE-TRIMETHOPRIM 800-160 MG PO TABS: 1.0000 | ORAL_TABLET | Freq: Three times a day (TID) | ORAL | Status: AC

## 2014-10-09 MED ORDER — OXYCODONE-ACETAMINOPHEN 5-325 MG PO TABS: 1.0000 | ORAL_TABLET | Freq: Four times a day (QID) | ORAL | Status: DC | PRN

## 2014-10-09 MED ORDER — CEPHALEXIN 500 MG PO CAPS: 500.0000 mg | ORAL_CAPSULE | Freq: Four times a day (QID) | ORAL | Status: DC

## 2014-10-09 NOTE — ED Notes (Signed)
MD at bedside. 

## 2014-10-09 NOTE — ED Notes (Signed)
Patient here with 4 days of upper lip swelling with redness and no drainage for same. Tender to touch

## 2014-10-09 NOTE — Discharge Instructions (Signed)
Facial Infection You have an infection of your face. This requires special attention to help prevent serious problems. Infections in facial wounds can cause poor healing and scars. They can also spread to deeper tissues, especially around the eye. Wound and dental infections can lead to sinusitis, infection of the eye socket, and even meningitis. Permanent damage to the skin, eye, and nervous system may result if facial infections are not treated properly. With severe infections, hospital care for IV antibiotic injections may be needed if they don't respond to oral antibiotics. Antibiotics must be taken for the full course to insure the infection is eliminated. If the infection came from a bad tooth, it may have to be extracted when the infection is under control. Warm compresses may be applied to reduce skin irritation and remove drainage. You might need a tetanus shot now if:  You cannot remember when your last tetanus shot was.  You have never had a tetanus shot.  The object that caused your wound was dirty. If you need a tetanus shot, and you decide not to get one, there is a rare chance of getting tetanus. Sickness from tetanus can be serious. If you got a tetanus shot, your arm may swell, get red and warm to the touch at the shot site. This is common and not a problem. SEEK IMMEDIATE MEDICAL CARE IF:   You have increased swelling, redness, or trouble breathing.  You have a severe headache, dizziness, nausea, or vomiting.  You develop problems with your eyesight.  You have a fever. Document Released: 06/27/2004 Document Revised: 08/12/2011 Document Reviewed: 05/20/2005 [facility] Patient Information 2015 [place], [place]. This information is not intended to replace advice given to you by your health care provider. Make sure you discuss any questions you have with your health care provider.

## 2014-10-09 NOTE — ED Provider Notes (Signed)
CSN: [id number]     Arrival date & time 10/09/14  5638 History   First MD Initiated Contact with Patient 10/09/14 1001     Chief Complaint  Patient presents with  . facial abscess      (Consider location/radiation/quality/duration/timing/severity/associated sxs/prior Treatment) The history is provided by the patient.   patient's had pain and swelling in his left upper lip for the last couple days. Is red. Thinks it is infected. No trauma. No systemic fevers. No difficulty swallowing. No trauma. States she's also having pain in both his knees. This is chronic. States his that he would like more pain medicines for this.  Past Medical History  Diagnosis Date  . Hypertension   . Depression   . Cancer     squamous cell "all in my face arms and hands"  . Chronic pain   . Headache(784.0)   . Arthritis   . Skin cancer    Past Surgical History  Procedure Laterality Date  . Skin cancer excision    . Left wrist    . Cartilage removed from both knees     Family History  Problem Relation Age of Onset  . Cancer Father   . Diabetes Father   . Hypertension Father    History  Substance Use Topics  . Smoking status: Current Some Day Smoker -- 0.50 packs/day for 20 years    Types: Cigarettes  . Smokeless tobacco: Never Used  . Alcohol Use: No    Review of Systems  Constitutional: Negative for fever and chills.  HENT: Positive for facial swelling. Negative for sinus pressure.   Skin: Positive for wound.      Allergies  Review of patient's allergies indicates no known allergies.  Home Medications   Prior to Admission medications   Medication Sig Start Date End Date Taking? Authorizing Provider  cephALEXin (KEFLEX) 500 MG capsule Take 1 capsule (500 mg total) by mouth 4 (four) times daily. 10/09/14   Davonna Belling, MD  diclofenac sodium (VOLTAREN) 1 % GEL Apply 2 g topically 4 (four) times daily. 12/20/13   Lance Bosch, NP  fluticasone (FLONASE) 50 MCG/ACT nasal spray Place 2  sprays into both nostrils daily. 11/23/13   Charlann Lange, PA-C  oxyCODONE-acetaminophen (PERCOCET/ROXICET) 5-325 MG per tablet Take 1-2 tablets by mouth every 6 (six) hours as needed for severe pain. 10/09/14   Davonna Belling, MD  sulfamethoxazole-trimethoprim (BACTRIM DS,SEPTRA DS) 800-160 MG per tablet Take 1 tablet by mouth 3 (three) times daily. 10/09/14 10/16/14  Davonna Belling, MD   BP 176/88 mmHg  Pulse 61  Temp(Src) 97.8 F (36.6 C) (Oral)  Resp 18  Ht 5\' 11"  (1.803 m)  Wt 195 lb (88.451 kg)  BMI 27.21 kg/m2  SpO2 100% Physical Exam  Constitutional: He appears well-developed.  HENT:  Erythema and swelling of left upper lip. No fluctuance. Starting to come to a head somewhat near the vermilion border.  Cardiovascular: Normal rate.     ED Course  Procedures (including critical care time) Labs Review Labs Reviewed - No data to display  Imaging Review No results found.   EKG Interpretation None      MDM   Final diagnoses:  Cellulitis, lip    Patient with lip infection. Appears to be cellulitis but may be developing abscess. Will treat with antibiotics. Will give small dose of pain medicines the patient was informed to not give him pain medicines for his knees. Will need monitoring to make sure this does not become  more severe on his face.    Davonna Belling, MD 10/09/14 1105

## 2014-10-09 NOTE — ED Notes (Signed)
Pt presents w/ c/o "abscess" at left upper lip area

## 2014-11-09 ENCOUNTER — Emergency Department (HOSPITAL_BASED_OUTPATIENT_CLINIC_OR_DEPARTMENT_OTHER)
Admission: EM | Admit: 2014-11-09 | Discharge: 2014-11-09 | Disposition: A | Discharge status: Home / Self Care | Payer: Medicare Other | Attending: Emergency Medicine | Admitting: Emergency Medicine

## 2014-11-09 ENCOUNTER — Encounter (HOSPITAL_BASED_OUTPATIENT_CLINIC_OR_DEPARTMENT_OTHER): Payer: Self-pay | Admitting: *Deleted

## 2014-11-09 DIAGNOSIS — G8929 Other chronic pain: Secondary | ICD-10-CM | POA: Diagnosis not present

## 2014-11-09 DIAGNOSIS — I10 Essential (primary) hypertension: Secondary | ICD-10-CM | POA: Insufficient documentation

## 2014-11-09 DIAGNOSIS — Z79899 Other long term (current) drug therapy: Secondary | ICD-10-CM | POA: Diagnosis not present

## 2014-11-09 DIAGNOSIS — M170 Bilateral primary osteoarthritis of knee: Secondary | ICD-10-CM | POA: Diagnosis not present

## 2014-11-09 DIAGNOSIS — Z791 Long term (current) use of non-steroidal anti-inflammatories (NSAID): Secondary | ICD-10-CM | POA: Insufficient documentation

## 2014-11-09 DIAGNOSIS — Z7951 Long term (current) use of inhaled steroids: Secondary | ICD-10-CM | POA: Diagnosis not present

## 2014-11-09 DIAGNOSIS — M25562 Pain in left knee: Secondary | ICD-10-CM | POA: Diagnosis present

## 2014-11-09 DIAGNOSIS — Z85828 Personal history of other malignant neoplasm of skin: Secondary | ICD-10-CM | POA: Diagnosis not present

## 2014-11-09 DIAGNOSIS — Z8659 Personal history of other mental and behavioral disorders: Secondary | ICD-10-CM | POA: Insufficient documentation

## 2014-11-09 DIAGNOSIS — Z720 Tobacco use: Secondary | ICD-10-CM | POA: Insufficient documentation

## 2014-11-09 DIAGNOSIS — M1710 Unilateral primary osteoarthritis, unspecified knee: Secondary | ICD-10-CM

## 2014-11-09 MED ORDER — OXYCODONE-ACETAMINOPHEN 5-325 MG PO TABS: 1.0000 | ORAL_TABLET | Freq: Four times a day (QID) | ORAL | Status: DC | PRN

## 2014-11-09 NOTE — ED Provider Notes (Signed)
CSN: [id number]     Arrival date & time 11/09/14  1021 History   First MD Initiated Contact with Patient 11/09/14 1045     Chief Complaint  Patient presents with  . Knee Pain     (Consider location/radiation/quality/duration/timing/severity/associated sxs/prior Treatment) The history is provided by the patient.  Don [name] is a 59 y.o. male history hypertension, bilateral knee arthritis here presenting with bilateral knee pain worse on the left. Worsening left knee pain since yesterday. Denies any fall or injury or fevers. Denies any joint swelling. He has been following up with Dr. Percell Miller, who recommend knee replacement but patient is uncertain if he wants to do that. Took some Motrin with no relief.   Past Medical History  Diagnosis Date  . Hypertension   . Depression   . Cancer     squamous cell "all in my face arms and hands"  . Chronic pain   . Headache(784.0)   . Arthritis   . Skin cancer    Past Surgical History  Procedure Laterality Date  . Skin cancer excision    . Left wrist    . Cartilage removed from both knees     Family History  Problem Relation Age of Onset  . Cancer Father   . Diabetes Father   . Hypertension Father    History  Substance Use Topics  . Smoking status: Current Some Day Smoker -- 0.50 packs/day for 20 years    Types: Cigarettes  . Smokeless tobacco: Never Used  . Alcohol Use: No    Review of Systems  Musculoskeletal:       Knee pain   All other systems reviewed and are negative.     Allergies  Review of patient's allergies indicates no known allergies.  Home Medications   Prior to Admission medications   Medication Sig Start Date End Date Taking? Authorizing Provider  cephALEXin (KEFLEX) 500 MG capsule Take 1 capsule (500 mg total) by mouth 4 (four) times daily. 10/09/14   Davonna Belling, MD  diclofenac sodium (VOLTAREN) 1 % GEL Apply 2 g topically 4 (four) times daily. 12/20/13   Lance Bosch, NP  fluticasone  (FLONASE) 50 MCG/ACT nasal spray Place 2 sprays into both nostrils daily. 11/23/13   Charlann Lange, PA-C  oxyCODONE-acetaminophen (PERCOCET/ROXICET) 5-325 MG per tablet Take 1-2 tablets by mouth every 6 (six) hours as needed for severe pain. 10/09/14   Davonna Belling, MD   BP 162/97 mmHg  Pulse 60  Temp(Src) 97.9 F (36.6 C) (Oral)  Resp 18  Ht 5\' 10"  (1.778 m)  Wt 192 lb (87.091 kg)  BMI 27.55 kg/m2  SpO2 100% Physical Exam  Constitutional: He is oriented to person, place, and time. He appears well-developed.  HENT:  Head: Normocephalic.  Eyes: Pupils are equal, round, and reactive to light.  Neck: Normal range of motion.  Cardiovascular: Normal rate.   Abdominal: Soft.  Musculoskeletal:  L knee with minimal effusion, nl ROM. ACL, PCL intact. Neurovascular intact. R knee with no effusion, nl ROM, neurovascular intact   Neurological: He is alert and oriented to person, place, and time.  Skin: Skin is warm and dry.  Psychiatric: He has a normal mood and affect. His behavior is normal. Judgment and thought content normal.  Nursing note and vitals reviewed.   ED Course  Procedures (including critical care time) Labs Review Labs Reviewed - No data to display  Imaging Review No results found.   EKG Interpretation None  MDM   Final diagnoses:  None    Don [name] is a 59 y.o. male here with knee pain likely from arthritis. No signs of gout or septic joint. Vitals stable. Will give several pills of percocet and have him see Dr. Percell Miller.      Wandra Arthurs, MD 11/09/14 [phone]

## 2014-11-09 NOTE — Discharge Instructions (Signed)
Take motrin for pain.  Use your cane.  Take percocet for severe pain.   See your orthopedic doctor.   Return to ER if you have severe pain, unable to walk.

## 2014-11-09 NOTE — ED Notes (Signed)
States he needs knee replacement on both knees- reports flare-up of pain in left knee since last night- has been taking ibuprofen without relief

## 2015-03-16 ENCOUNTER — Encounter (HOSPITAL_BASED_OUTPATIENT_CLINIC_OR_DEPARTMENT_OTHER): Payer: Self-pay

## 2015-03-16 ENCOUNTER — Emergency Department (HOSPITAL_BASED_OUTPATIENT_CLINIC_OR_DEPARTMENT_OTHER)
Admission: EM | Admit: 2015-03-16 | Discharge: 2015-03-16 | Disposition: A | Discharge status: Home / Self Care | Payer: Medicare Other | Attending: Emergency Medicine | Admitting: Emergency Medicine

## 2015-03-16 DIAGNOSIS — Z720 Tobacco use: Secondary | ICD-10-CM | POA: Insufficient documentation

## 2015-03-16 DIAGNOSIS — Z8739 Personal history of other diseases of the musculoskeletal system and connective tissue: Secondary | ICD-10-CM | POA: Insufficient documentation

## 2015-03-16 DIAGNOSIS — I10 Essential (primary) hypertension: Secondary | ICD-10-CM | POA: Insufficient documentation

## 2015-03-16 DIAGNOSIS — G8929 Other chronic pain: Secondary | ICD-10-CM | POA: Insufficient documentation

## 2015-03-16 DIAGNOSIS — Z85828 Personal history of other malignant neoplasm of skin: Secondary | ICD-10-CM | POA: Insufficient documentation

## 2015-03-16 DIAGNOSIS — R42 Dizziness and giddiness: Secondary | ICD-10-CM | POA: Diagnosis present

## 2015-03-16 DIAGNOSIS — Z8659 Personal history of other mental and behavioral disorders: Secondary | ICD-10-CM | POA: Diagnosis not present

## 2015-03-16 DIAGNOSIS — H81399 Other peripheral vertigo, unspecified ear: Secondary | ICD-10-CM

## 2015-03-16 LAB — CBG MONITORING, ED: Glucose-Capillary: 101 mg/dL — ABNORMAL HIGH (ref 65–99)

## 2015-03-16 MED ORDER — MECLIZINE HCL 25 MG PO TABS
25.0000 mg | ORAL_TABLET | Freq: Once | ORAL | Status: AC
  Administered 2015-03-16: 25 mg via ORAL
  Filled 2015-03-16: qty 1

## 2015-03-16 MED ORDER — MECLIZINE HCL 25 MG PO TABS: 25.0000 mg | ORAL_TABLET | Freq: Three times a day (TID) | ORAL | Status: DC | PRN

## 2015-03-16 NOTE — ED Notes (Signed)
C/o dizzness when bends over to tie shoes and when he lies down in bed x days-denies HA or CP-steady gait into triage

## 2015-03-16 NOTE — Discharge Instructions (Signed)
Benign Positional Vertigo Vertigo is the feeling that you or your surroundings are moving when they are not. Benign positional vertigo is the most common form of vertigo. The cause of this condition is not serious (is benign). This condition is triggered by certain movements and positions (is positional). This condition can be dangerous if it occurs while you are doing something that could endanger you or others, such as driving.  CAUSES In many cases, the cause of this condition is not known. It may be caused by a disturbance in an area of the inner ear that helps your brain to sense movement and balance. This disturbance can be caused by a viral infection (labyrinthitis), head injury, or repetitive motion. RISK FACTORS This condition is more likely to develop in:  Women.  People who are 50 years of age or older. SYMPTOMS Symptoms of this condition usually happen when you move your head or your eyes in different directions. Symptoms may start suddenly, and they usually last for less than a minute. Symptoms may include:  Loss of balance and falling.  Feeling like you are spinning or moving.  Feeling like your surroundings are spinning or moving.  Nausea and vomiting.  Blurred vision.  Dizziness.  Involuntary eye movement (nystagmus). Symptoms can be mild and cause only slight annoyance, or they can be severe and interfere with daily life. Episodes of benign positional vertigo may return (recur) over time, and they may be triggered by certain movements. Symptoms may improve over time. DIAGNOSIS This condition is usually diagnosed by medical history and a physical exam of the head, neck, and ears. You may be referred to a health care provider who specializes in ear, nose, and throat (ENT) problems (otolaryngologist) or a provider who specializes in disorders of the nervous system (neurologist). You may have additional testing, including:  MRI.  A CT scan.  Eye movement tests. Your  health care provider may ask you to change positions quickly while he or she watches you for symptoms of benign positional vertigo, such as nystagmus. Eye movement may be tested with an electronystagmogram (ENG), caloric stimulation, the Dix-Hallpike test, or the roll test.  An electroencephalogram (EEG). This records electrical activity in your brain.  Hearing tests. TREATMENT Usually, your health care provider will treat this by moving your head in specific positions to adjust your inner ear back to normal. Surgery may be needed in severe cases, but this is rare. In some cases, benign positional vertigo may resolve on its own in 2-4 weeks. HOME CARE INSTRUCTIONS Safety  Move slowly.Avoid sudden body or head movements.  Avoid driving.  Avoid operating heavy machinery.  Avoid doing any tasks that would be dangerous to you or others if a vertigo episode would occur.  If you have trouble walking or keeping your balance, try using a cane for stability. If you feel dizzy or unstable, sit down right away.  Return to your normal activities as told by your health care provider. Ask your health care provider what activities are safe for you. General Instructions  Take over-the-counter and prescription medicines only as told by your health care provider.  Avoid certain positions or movements as told by your health care provider.  Drink enough fluid to keep your urine clear or pale yellow.  Keep all follow-up visits as told by your health care provider. This is important. SEEK MEDICAL CARE IF:  You have a fever.  Your condition gets worse or you develop new symptoms.  Your family or friends   notice any behavioral changes.  Your nausea or vomiting gets worse.  You have numbness or a "pins and needles" sensation. SEEK IMMEDIATE MEDICAL CARE IF:  You have difficulty speaking or moving.  You are always dizzy.  You faint.  You develop severe headaches.  You have weakness in your  legs or arms.  You have changes in your hearing or vision.  You develop a stiff neck.  You develop sensitivity to light.   This information is not intended to replace advice given to you by your health care provider. Make sure you discuss any questions you have with your health care provider.   Document Released: 02/25/2006 Document Revised: 02/08/2015 Document Reviewed: 09/12/2014 Elsevier Interactive Patient Education 2016 [employer]  

## 2015-03-16 NOTE — ED Provider Notes (Addendum)
CSN: [id number]     Arrival date & time 03/16/15  1823 History  By signing my name below, I, Tula Nakayama, attest that this documentation has been prepared under the direction and in the presence of Deno Etienne, DO.  Electronically Signed: Tula Nakayama, ED Scribe. 03/16/2015. 6:51 PM.  Chief Complaint  Patient presents with  . Dizziness   The history is provided by the patient. No language interpreter was used.   HPI Comments: Don [name] is a 59 y.o. male who presents to the Emergency Department complaining of intermittent, moderate dizziness that started 4 days ago and became worse today. Pt states onset of symptoms occurs with lying down, sitting and bending forward. His episodes resolve with standing. He also reports "not feeling quite right" with walking and a mild, intermittent, tight, pressure-like sensation in his face. Pt has a history of similar symptoms that occurred several years ago and resolved on their own. He denies HA, neck pain, CP, SOB, difficulty ambulating, tinnitus, ear pain, congestion and rhinorrhea.  Past Medical History  Diagnosis Date  . Hypertension   . Depression   . 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 Chronic pain   . Headache(784.0)   . Arthritis   . Skin cancer    Past Surgical History  Procedure Laterality Date  . Skin cancer excision    . Left wrist    . Cartilage removed from both knees     Family History  Problem Relation Age of Onset  . Cancer Father   . Diabetes Father   . Hypertension Father    Social History  Substance Use Topics  . Smoking status: Current Some Day Smoker -- 0.50 packs/day for 20 years    Types: Cigarettes  . Smokeless tobacco: Never Used  . Alcohol Use: No   Review of Systems  Constitutional: Negative for fever and chills.  HENT: Negative for congestion, ear pain, facial swelling, rhinorrhea and tinnitus.   Eyes: Negative for discharge and visual disturbance.   Respiratory: Negative for shortness of breath.   Cardiovascular: Negative for chest pain and palpitations.  Gastrointestinal: Negative for vomiting, abdominal pain and diarrhea.  Musculoskeletal: Negative for myalgias, arthralgias, gait problem and neck pain.  Skin: Negative for color change and rash.  Neurological: Positive for dizziness. Negative for tremors, syncope and headaches.  Psychiatric/Behavioral: Negative for confusion and dysphoric mood.  All other systems reviewed and are negative.  Allergies  Review of patient's allergies indicates no known allergies.  Home Medications   Prior to Admission medications   Medication Sig Start Date End Date Taking? Authorizing Provider  meclizine (ANTIVERT) 25 MG tablet Take 1 tablet (25 mg total) by mouth 3 (three) times daily as needed for dizziness. 03/16/15   Deno Etienne, DO   BP 150/87 mmHg  Pulse 62  Temp(Src) 97.6 F (36.4 C) (Oral)  Resp 20  Ht 5\' 11"  (1.803 m)  Wt 185 lb (83.915 kg)  BMI 25.81 kg/m2  SpO2 99% Physical Exam  Constitutional: He is oriented to person, place, and time. He appears well-developed and well-nourished. No distress.  HENT:  Head: Normocephalic and atraumatic.  Mouth/Throat: Oropharynx is clear and moist.  No noted sinus tenderness to palpation, swollen turbinates  Eyes: Conjunctivae and EOM are normal. Pupils are equal, round, and reactive to light.  Neck: Neck supple. No tracheal deviation present.  Cardiovascular: Normal rate, regular rhythm and normal heart sounds.   Pulmonary/Chest: Effort  normal and breath sounds normal. No respiratory distress. He has no wheezes.  Abdominal: Soft. There is no tenderness. There is no rebound and no guarding.  Neurological: He is alert and oriented to person, place, and time. He has normal reflexes. No cranial nerve deficit or sensory deficit. He exhibits normal muscle tone. Coordination and gait normal. GCS eye subscore is 4. GCS verbal subscore is 5. GCS motor  subscore is 6. He displays no Babinski's sign on the right side. He displays no Babinski's sign on the left side.  Reflex Scores:      Tricep reflexes are 2+ on the right side and 2+ on the left side.      Bicep reflexes are 2+ on the right side and 2+ on the left side.      Brachioradialis reflexes are 2+ on the right side and 2+ on the left side.      Patellar reflexes are 2+ on the right side and 2+ on the left side.      Achilles reflexes are 2+ on the right side and 2+ on the left side. Mild nystagmus to left, but no dizziness with it No deficits bilaterally  Skin: Skin is warm and dry.  Psychiatric: He has a normal mood and affect. His behavior is normal.  Nursing note and vitals reviewed.   ED Course  Procedures  DIAGNOSTIC STUDIES: Oxygen Saturation is 99% on RA, normal by my interpretation.    COORDINATION OF CARE: 6:50 PM Discussed treatment plan with pt which includes lab work and Meclizine. Pt agreed to plan.   Labs Review Labs Reviewed  CBG MONITORING, ED - Abnormal; Notable for the following:    Glucose-Capillary 101 (*)    All other components within normal limits    Imaging Review No results found. I have personally reviewed and evaluated these lab results as part of my medical decision-making.   EKG Interpretation None      MDM   Final diagnoses:  Peripheral vertigo, unspecified laterality    59 yo M with a chief complaint of vertigo. This is worse when he bends over and turns his head in certain direction. Patient states been going on for about 4 days. Slowly worsening. Patient has had some mild congestion with this as well.  As soon as he changes head position the symptoms resolved. Patient denies any recent injuries denies trauma.  On exam patient with vascular nystagmus to the left but without vertigo. Dix-Hallpike bilaterally with no symptomatology. Patient's history consistent with BPPV. Doubt central cause this patient is able to walk without  difficulty. Symptoms are fast in onset and resolve spontaneously. We'll have the patient follow with his family doctor. Start on meclizine.   I personally performed the services described in this documentation, which was scribed in my presence. The recorded information has been reviewed and is accurate.     Deno Etienne, DO 03/16/15 [place], DO 03/16/15 1918

## 2015-03-16 NOTE — ED Notes (Signed)
MD at bedside. 

## 2015-05-09 ENCOUNTER — Emergency Department (HOSPITAL_COMMUNITY)
Admission: EM | Admit: 2015-05-09 | Discharge: 2015-05-09 | Disposition: A | Discharge status: Home / Self Care | Payer: Medicare Other | Attending: Emergency Medicine | Admitting: Emergency Medicine

## 2015-05-09 ENCOUNTER — Encounter (HOSPITAL_COMMUNITY): Payer: Self-pay | Admitting: Emergency Medicine

## 2015-05-09 DIAGNOSIS — F17210 Nicotine dependence, cigarettes, uncomplicated: Secondary | ICD-10-CM | POA: Insufficient documentation

## 2015-05-09 DIAGNOSIS — I10 Essential (primary) hypertension: Secondary | ICD-10-CM | POA: Diagnosis not present

## 2015-05-09 DIAGNOSIS — Z8739 Personal history of other diseases of the musculoskeletal system and connective tissue: Secondary | ICD-10-CM | POA: Insufficient documentation

## 2015-05-09 DIAGNOSIS — G8929 Other chronic pain: Secondary | ICD-10-CM | POA: Diagnosis not present

## 2015-05-09 DIAGNOSIS — F11129 Opioid abuse with intoxication, unspecified: Secondary | ICD-10-CM | POA: Diagnosis present

## 2015-05-09 DIAGNOSIS — Z85828 Personal history of other malignant neoplasm of skin: Secondary | ICD-10-CM | POA: Insufficient documentation

## 2015-05-09 DIAGNOSIS — F1110 Opioid abuse, uncomplicated: Secondary | ICD-10-CM | POA: Diagnosis not present

## 2015-05-09 DIAGNOSIS — F1910 Other psychoactive substance abuse, uncomplicated: Secondary | ICD-10-CM

## 2015-05-09 NOTE — ED Provider Notes (Signed)
CSN: [id number]     Arrival date & time 05/09/15  1329 History  By signing my name below, I, Don [name], attest that this documentation has been prepared under the direction and in the presence of PA. Electronically Signed: Erling [name], ED Scribe. 05/09/2015. 2:25 PM.    Chief Complaint  Patient presents with  . Detox from Crack & Opiates   . Not SI/HI    The history is provided by the patient. No language interpreter was used.    HPI Comments: Don [name] is a 59 y.o. male with a h/o squamous cell carcinoma who presents to the Emergency Department for assistance with detox from crack and opiates. He has concern that he may unintentionally harm himself or others due to his behavior while under the influence. He endorses associated difficulty sleeping. Pt states he has been abusing opiates for mgmt of his chronic bilateral knee pain; he notes he does not have a PCP that will prescribe the opiates so he has been buying the medication off the streets. 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 He endorses he last used crack last night and his last opiate use was 2 days ago. He states he is not currently receiving cancer treatments but he is followed by [facility] to have his skin cancer "cut out". Pt notes he has previously had SI without a plan but denies any current SI. He also denies any HI, chest pain, tremors, SOB, syncope, abdominal pain, nausea, vomiting, dysuria, or other associated symptoms.    Past Medical History  Diagnosis Date  . Hypertension   . Depression   . Cancer ([place])     squamous cell "all in my face arms and hands"  . Chronic pain   . Headache(784.0)   . Arthritis   . Skin cancer    Past Surgical History  Procedure Laterality Date  . Skin cancer excision    . Left wrist    . Cartilage removed from both knees     Family History  Problem Relation Age of Onset  .  Cancer Father   . Diabetes Father   . Hypertension Father    Social History  Substance Use Topics  . Smoking status: Current Some Day Smoker -- 0.50 packs/day for 20 years    Types: Cigarettes  . Smokeless tobacco: Never Used  . Alcohol Use: No    Review of Systems  All other systems reviewed and are negative.     Allergies  Review of patient's allergies indicates no known allergies.  Home Medications   Prior to Admission medications   Medication Sig Start Date End Date Taking? Authorizing Provider  meclizine (ANTIVERT) 25 MG tablet Take 1 tablet (25 mg total) by mouth 3 (three) times daily as needed for dizziness. 03/16/15   Deno Etienne, DO   Triage Vitals: BP 164/98 mmHg  Pulse 87  Temp(Src) 98.7 F (37.1 C) (Oral)  Resp 18  SpO2 100%  Physical Exam  Constitutional: He is oriented to person, place, and time. He appears well-developed and well-nourished. No distress.  HENT:  Head: Normocephalic and atraumatic.  Mouth/Throat: Oropharynx is clear and moist. No oropharyngeal exudate.  Eyes: Conjunctivae and EOM are normal. Pupils are equal, round, and reactive to light. Right eye exhibits no discharge. Left eye exhibits no discharge. No scleral icterus.  Neck: Normal range of motion. Neck supple. No JVD present.  No thyromegaly present.  Cardiovascular: Normal rate, regular rhythm, normal heart sounds and intact distal pulses.  Exam reveals no gallop and no friction rub.   No murmur heard. Pulmonary/Chest: Effort normal and breath sounds normal. No respiratory distress. He has no wheezes. He has no rales. He exhibits no tenderness.  Abdominal: Soft. He exhibits no distension. There is no tenderness. There is no guarding.  Musculoskeletal: Normal range of motion. He exhibits no edema.  Lymphadenopathy:    He has no cervical adenopathy.  Neurological: He is alert and oriented to person, place, and time. No cranial nerve deficit.  Strength 5/5 throughout. No sensory deficits.   No gait abnormality.  Skin: Skin is warm and dry. No rash noted. He is not diaphoretic. No erythema. No pallor.  Psychiatric: He has a normal mood and affect. His behavior is normal.  Nursing note and vitals reviewed.   ED Course  Procedures (including critical care time)  DIAGNOSTIC STUDIES: Oxygen Saturation is 100% on RA, normal by my interpretation.    COORDINATION OF CARE:    Labs Review Labs Reviewed - No data to display  Imaging Review No results found. I have personally reviewed and evaluated these images and lab results as part of my medical decision-making.   EKG Interpretation None      MDM   Final diagnoses:  Substance abuse   Pt is here requesting detox from crack and opiates. Pt appears well in ED, non-toxic. No SI/HI, hallucinations. Physical exam unremarkable. Pt is medically cleared from this standpoint, VSS. Will provide pt with outpatient resources for substance abuse. Discussed treatment plan with pt who is agreeable. Return precautions outlined in patient discharge instructions.   Patient was discussed with  Dr. Alvino Chapel who agrees with the treatment plan.     I personally performed the services described in this documentation, which was scribed in my presence. The recorded information has been reviewed and is accurate.      Dondra Spry [place], PA-C 05/09/15 [id number]  Davonna Belling, MD 05/10/15 [phone]

## 2015-05-09 NOTE — ED Notes (Signed)
Pt states he wants detox from crack and opiates.  Last crack use last night. Last opiates 2 days ago.  Pt adamantly denies SI/HI.  No other sx.

## 2015-05-09 NOTE — Discharge Instructions (Signed)
Polysubstance Abuse When people abuse more than one drug or type of drug it is called polysubstance or polydrug abuse. For example, many smokers also drink alcohol. This is one form of polydrug abuse. Polydrug abuse also refers to the use of a drug to counteract an unpleasant effect produced by another drug. It may also be used to help with withdrawal from another drug. People who take stimulants may become agitated. Sometimes this agitation is countered with a tranquilizer. This helps protect against the unpleasant side effects. Polydrug abuse also refers to the use of different drugs at the same time.  Anytime drug use is interfering with normal living activities, it has become abuse. This includes problems with family and friends. Psychological dependence has developed when your mind tells you that the drug is needed. This is usually followed by physical dependence which has developed when continuing increases of drug are required to get the same feeling or "high". This is known as addiction or chemical dependency. A person's risk is much higher if there is a history of chemical dependency in the family. SIGNS OF CHEMICAL DEPENDENCY  You have been told by friends or family that drugs have become a problem.  You fight when using drugs.  You are having blackouts (not remembering what you do while using).  You feel sick from using drugs but continue using.  You lie about use or amounts of drugs (chemicals) used.  You need chemicals to get you going.  You are suffering in work performance or in school because of drug use.  You get sick from use of drugs but continue to use anyway.  You need drugs to relate to people or feel comfortable in social situations.  You use drugs to forget problems. "Yes" answered to any of the above signs of chemical dependency indicates there are problems. The longer the use of drugs continues, the greater the problems will become. If there is a family history of  drug or alcohol use, it is best not to experiment with these drugs. Continual use leads to tolerance. After tolerance develops more of the drug is needed to get the same feeling. This is followed by addiction. With addiction, drugs become the most important part of life. It becomes more important to take drugs than participate in the other usual activities of life. This includes relating to friends and family. Addiction is followed by dependency. Dependency is a condition where drugs are now needed not just to get high, but to feel normal. Addiction cannot be cured but it can be stopped. This often requires outside help and the care of professionals. Treatment centers are listed in the yellow pages under: Cocaine, Narcotics, and Alcoholics Anonymous. Most hospitals and clinics can refer you to a specialized care center. Talk to your caregiver if you need help.   This information is not intended to replace advice given to you by your health care provider. Make sure you discuss any questions you have with your health care provider.   Please use the resource guide were given to contact a substance abuse treatment center. Return to the emergency department if you experience suicidal or homicidal ideation, auditory or visual hallucinations, chest pain, shortness of breath, loss of consciousness.

## 2016-04-10 ENCOUNTER — Encounter (HOSPITAL_COMMUNITY): Payer: Self-pay

## 2016-04-10 ENCOUNTER — Emergency Department (HOSPITAL_COMMUNITY): Payer: Medicare Other

## 2016-04-10 ENCOUNTER — Emergency Department (HOSPITAL_COMMUNITY)
Admission: EM | Admit: 2016-04-10 | Discharge: 2016-04-10 | Disposition: A | Discharge status: Home / Self Care | Payer: Medicare Other | Attending: Dermatology | Admitting: Dermatology

## 2016-04-10 DIAGNOSIS — Z5321 Procedure and treatment not carried out due to patient leaving prior to being seen by health care provider: Secondary | ICD-10-CM | POA: Diagnosis not present

## 2016-04-10 DIAGNOSIS — I10 Essential (primary) hypertension: Secondary | ICD-10-CM | POA: Insufficient documentation

## 2016-04-10 DIAGNOSIS — M25561 Pain in right knee: Secondary | ICD-10-CM | POA: Insufficient documentation

## 2016-04-10 DIAGNOSIS — F17210 Nicotine dependence, cigarettes, uncomplicated: Secondary | ICD-10-CM | POA: Insufficient documentation

## 2016-04-10 NOTE — ED Notes (Signed)
Pt reported to Registration that he no longer wants to be seen d/t wait time and returned armband and labels.

## 2016-04-10 NOTE — ED Triage Notes (Signed)
Pt c/o R knee pain x 2 weeks.  Pain score 10/10.  Pt has not taken anything for pain.  Denies injury.  Swelling noted.  Pt reports having "cartilage removed around 30 years ago."  Pt does not have PCP and would like a referral.

## 2016-05-03 ENCOUNTER — Emergency Department (HOSPITAL_BASED_OUTPATIENT_CLINIC_OR_DEPARTMENT_OTHER)
Admission: EM | Admit: 2016-05-03 | Discharge: 2016-05-03 | Disposition: A | Discharge status: Home / Self Care | Payer: Medicare Other | Attending: Emergency Medicine | Admitting: Emergency Medicine

## 2016-05-03 ENCOUNTER — Encounter (HOSPITAL_BASED_OUTPATIENT_CLINIC_OR_DEPARTMENT_OTHER): Payer: Self-pay | Admitting: *Deleted

## 2016-05-03 DIAGNOSIS — K921 Melena: Secondary | ICD-10-CM | POA: Diagnosis present

## 2016-05-03 DIAGNOSIS — Z85828 Personal history of other malignant neoplasm of skin: Secondary | ICD-10-CM | POA: Insufficient documentation

## 2016-05-03 DIAGNOSIS — K648 Other hemorrhoids: Secondary | ICD-10-CM | POA: Diagnosis not present

## 2016-05-03 DIAGNOSIS — I10 Essential (primary) hypertension: Secondary | ICD-10-CM | POA: Diagnosis not present

## 2016-05-03 DIAGNOSIS — F17210 Nicotine dependence, cigarettes, uncomplicated: Secondary | ICD-10-CM | POA: Insufficient documentation

## 2016-05-03 MED ORDER — HYDROCORTISONE ACETATE 25 MG RE SUPP: 25.0000 mg | Freq: Two times a day (BID) | RECTAL | 0 refills | Status: DC

## 2016-05-03 NOTE — ED Provider Notes (Signed)
[facility] Provider Note   CSN: [id number] Arrival date & time: 05/03/16  [id number]     History   Chief Complaint Chief Complaint  Patient presents with  . blood in stool    HPI Don [name] is a 60 y.o. male.  Patient is a 60 year old male with a history of hypertension, chronic pain presenting today for blood in his stool. He states he was constipated for several weeks and started taking multiple laxatives. For 4 days he noticed bright red blood in his stool that was nonpainful. He stopped taking the laxatives and today he had a normal bowel movement with no blood in his stool. He denies any chest pain, shortness of breath, lightheadedness. He has never had blood in his stool before. He does not normally suffer from constipation. He denies any abdominal pain, nausea or vomiting. He does note that he has a history of hypertension but has not been treated for some time because his doctor left town and is requesting help finding a new physician. He has never had a colonoscopy and does not get routine medical screening.    The history is provided by the patient.    Past Medical History:  Diagnosis Date  . Arthritis   . Cancer ([place])    squamous cell "all in my face arms and hands"  . Chronic pain   . Depression   . Headache(784.0)   . Hypertension   . Skin cancer     There are no active problems to display for this patient.   Past Surgical History:  Procedure Laterality Date  . cartilage removed from both knees    . KNEE ARTHROSCOPY    . left wrist    . SKIN CANCER EXCISION         Home Medications    Prior to Admission medications   Medication Sig Start Date End Date Taking? Authorizing Provider  hydrocortisone (ANUSOL-HC) 25 MG suppository Place 1 suppository (25 mg total) rectally 2 (two) times daily. If having blood in the stool 05/03/16   Blanchie Dessert, MD  meclizine (ANTIVERT) 25 MG tablet Take 1 tablet (25 mg total) by mouth 3 (three) times  daily as needed for dizziness. 03/16/15   Deno Etienne, DO    Family History Family History  Problem Relation Age of Onset  . Cancer Father   . Diabetes Father   . Hypertension Father     Social History Social History  Substance Use Topics  . Smoking status: Current Some Day Smoker    Packs/day: 0.50    Years: 20.00    Types: Cigarettes  . Smokeless tobacco: Never Used  . Alcohol use No     Allergies   Patient has no known allergies.   Review of Systems Review of Systems  All other systems reviewed and are negative.    Physical Exam Updated Vital Signs BP (!) 152/106 (BP Location: Right Arm)   Pulse 79   Temp 97.8 F (36.6 C) (Oral)   Resp 16   Ht 5\' 9"  (1.753 m)   Wt 175 lb (79.4 kg)   SpO2 100%   BMI 25.84 kg/m   Physical Exam  Constitutional: He is oriented to person, place, and time. He appears well-developed and well-nourished. No distress.  HENT:  Head: Normocephalic and atraumatic.  Mouth/Throat: Oropharynx is clear and moist.  Eyes: Conjunctivae and EOM are normal. Pupils are equal, round, and reactive to light.  Neck: Normal range of motion. Neck supple.  Cardiovascular: Normal rate, regular rhythm and intact distal pulses.   No murmur heard. Pulmonary/Chest: Effort normal and breath sounds normal. No respiratory distress. He has no wheezes. He has no rales.  Abdominal: Soft. He exhibits no distension. There is no tenderness. There is no rebound and no guarding.  Genitourinary: Rectal exam shows internal hemorrhoid.  Musculoskeletal: Normal range of motion. He exhibits no edema or tenderness.  Neurological: He is alert and oriented to person, place, and time.  Skin: Skin is warm and dry. No rash noted. No erythema.  Psychiatric: He has a normal mood and affect. His behavior is normal.  Nursing note and vitals reviewed.    ED Treatments / Results  Labs (all labs ordered are listed, but only abnormal results are displayed) Labs Reviewed - No  data to display  EKG  EKG Interpretation None       Radiology No results found.  Procedures Procedures (including critical care time)  Medications Ordered in ED Medications - No data to display   Initial Impression / Assessment and Plan / ED Course  I have reviewed the triage vital signs and the nursing notes.  Pertinent labs & imaging results that were available during my care of the patient were reviewed by me and considered in my medical decision making (see chart for details).  Clinical Course     Patient is a 60 year old male presenting with rectal bleeding that is now resolved. The blood was always bright red and nonpainful. He denies any other associated symptoms. This started after 2 weeks of constipation when he started taking laxatives. However today he had a normal bowel movement without bleeding present. On exam patient does have internal hemorrhoids but none that are currently bleeding. Low suspicion for upper or lower GI bleeding at this time. Patient is well-appearing and in no acute distress. Vital signs show persistent hypertension however patient is aware of this and is requesting help finding a regular doctor. He understands the importance of this to start blood pressure medication as well as ongoing general medical screening such as colonoscopies. Patient given Anusol suppositories to start if the bleeding returns. Also discussed diet changes to avoid ongoing issues with constipation.  Final Clinical Impressions(s) / ED Diagnoses   Final diagnoses:  Internal hemorrhoids without complication  Hypertension, unspecified type    New Prescriptions New Prescriptions   HYDROCORTISONE (ANUSOL-HC) 25 MG SUPPOSITORY    Place 1 suppository (25 mg total) rectally 2 (two) times daily. If having blood in the stool     Blanchie Dessert, MD 05/03/16 1047

## 2016-05-03 NOTE — ED Triage Notes (Signed)
Pt reports constipation last week, "I took a lot of stool softeners, too many" pt clarifies that he was taking 5 or 6 a day for a week, noticed some blood in his stools several days ago so stopped taking the stool softeners, blood has cleared up, had good bm this morning "clear as a bell", pt states "I got to thinking about it and thought I might still need to come get it looked at." denies any abd pain, n/v/d or other c/o. Pt states he is also concerned that his bp was high when he was at [facility] last week, and requests a referral to a primary care doctor.

## 2016-07-17 ENCOUNTER — Encounter (HOSPITAL_BASED_OUTPATIENT_CLINIC_OR_DEPARTMENT_OTHER): Payer: Self-pay | Admitting: Emergency Medicine

## 2016-07-17 ENCOUNTER — Emergency Department (HOSPITAL_BASED_OUTPATIENT_CLINIC_OR_DEPARTMENT_OTHER)
Admission: EM | Admit: 2016-07-17 | Discharge: 2016-07-17 | Disposition: A | Discharge status: Home / Self Care | Payer: Medicare Other | Attending: Emergency Medicine | Admitting: Emergency Medicine

## 2016-07-17 ENCOUNTER — Emergency Department (HOSPITAL_BASED_OUTPATIENT_CLINIC_OR_DEPARTMENT_OTHER): Payer: Medicare Other

## 2016-07-17 DIAGNOSIS — Z85828 Personal history of other malignant neoplasm of skin: Secondary | ICD-10-CM | POA: Insufficient documentation

## 2016-07-17 DIAGNOSIS — M25561 Pain in right knee: Secondary | ICD-10-CM | POA: Diagnosis present

## 2016-07-17 DIAGNOSIS — M1711 Unilateral primary osteoarthritis, right knee: Secondary | ICD-10-CM | POA: Diagnosis not present

## 2016-07-17 DIAGNOSIS — F17210 Nicotine dependence, cigarettes, uncomplicated: Secondary | ICD-10-CM | POA: Diagnosis not present

## 2016-07-17 DIAGNOSIS — I10 Essential (primary) hypertension: Secondary | ICD-10-CM | POA: Insufficient documentation

## 2016-07-17 NOTE — ED Triage Notes (Signed)
Pt having right knee pain which is chronic, pt states he needs knee replacement.  Pt states pain and swelling have increased over the last two weeks.

## 2016-07-17 NOTE — ED Provider Notes (Signed)
[facility] Provider Note   CSN: [id number] Arrival date & time: 07/17/16  0741     History   Chief Complaint Chief Complaint  Patient presents with  . Knee Pain    HPI AARJAV SCHECHTER is a 61 y.o. male.  HPI Patient has a history of arthritis and chronic knee pain. States he's having worsening pain of the last 2 weeks. States he's been walking more than normal. Denies any other trauma. Denies fever or chills. States had swelling to the right knee. No redness or warmth. States he's seen an orthopedist in the past and that he needs to have a knee replacement. Past Medical History:  Diagnosis Date  . Arthritis   . Cancer ([place])    squamous cell "all in my face arms and hands"  . Chronic pain   . Depression   . Headache(784.0)   . Hypertension   . Skin cancer     There are no active problems to display for this patient.   Past Surgical History:  Procedure Laterality Date  . cartilage removed from both knees    . KNEE ARTHROSCOPY    . left wrist    . SKIN CANCER EXCISION         Home Medications    Prior to Admission medications   Not on File    Family History Family History  Problem Relation Age of Onset  . Cancer Father   . Diabetes Father   . Hypertension Father     Social History Social History  Substance Use Topics  . Smoking status: Current Some Day Smoker    Packs/day: 0.50    Years: 20.00    Types: Cigarettes  . Smokeless tobacco: Never Used  . Alcohol use No     Allergies   Patient has no known allergies.   Review of Systems Review of Systems  Constitutional: Negative for chills, fatigue and fever.  Musculoskeletal: Positive for arthralgias and joint swelling. Negative for back pain and myalgias.  Skin: Negative for color change, rash and wound.  Neurological: Negative for weakness and numbness.  All other systems reviewed and are negative.    Physical Exam Updated Vital Signs BP (!) 172/108   Pulse 60    Temp 97.5 F (36.4 C) (Oral)   Resp 18   Ht 5\' 10"  (1.778 m)   Wt 175 lb (79.4 kg)   SpO2 100%   BMI 25.11 kg/m   Physical Exam  Constitutional: He is oriented to person, place, and time. He appears well-developed and well-nourished. No distress.  HENT:  Head: Normocephalic and atraumatic.  Eyes: EOM are normal. Pupils are equal, round, and reactive to light.  Neck: Normal range of motion. Neck supple.  Cardiovascular: Normal rate and regular rhythm.   Pulmonary/Chest: Effort normal.  Abdominal: Soft. There is no tenderness.  Musculoskeletal: Normal range of motion. He exhibits edema, tenderness and deformity.  Patient with full range of motion of the right knee. He has no erythema or warmth. No ligamentous instability. There does appear to be a cystic lesion in the medial proximal tibial area. There is some fluctuance. No tenderness to palpation over this area. Patient does have some mild tenderness in the suprapatellar region especially over the medial portion. Distal pulses intact.  Neurological: He is alert and oriented to person, place, and time.  Skin: Skin is warm and dry. Capillary refill takes less than 2 seconds. No rash noted. No erythema.  Psychiatric: He has a normal  mood and affect. His behavior is normal.  Nursing note and vitals reviewed.    ED Treatments / Results  Labs (all labs ordered are listed, but only abnormal results are displayed) Labs Reviewed - No data to display  EKG  EKG Interpretation None       Radiology Dg Knee Complete 4 Views Right  Result Date: 07/17/2016 CLINICAL DATA:  Right knee pain and swelling for couple of weeks EXAM: RIGHT KNEE - COMPLETE 4+ VIEW COMPARISON:  04/10/2016 FINDINGS: Four views of the right knee submitted. No acute fracture or subluxation. Again noted large loose body suprapatellar bursa measures about 2.9 cm. Again noted significant joint space narrowing most significant medial compartment. There is sclerosis of  medial tibial plateau. Stable dystrophic calcifications in the region of medial collateral ligament. Again noted spurring of medial tibial plateau. Significant spurring of patella. Narrowing of patellofemoral joint space. Chondrocalcinosis. IMPRESSION: 1. No acute fracture or subluxation. Again noted large loose body in suprapatellar bursa suspicious for synovial osteochondromatosis. Extensive osteoarthritic changes again noted with significant narrowing of joint space. Sclerotic changes noted medial aspect of tibial plateau. There is spurring of medial tibial plateau. Significant spurring of patella. Narrowing of patellofemoral joint space. Small joint effusion. Chondrocalcinosis again noted. Stable dystrophic calcifications in the region of medial collateral ligament. Electronically Signed   By: Lahoma Crocker M.D.   On: 07/17/2016 08:59    Procedures Procedures (including critical care time)  Medications Ordered in ED Medications - No data to display   Initial Impression / Assessment and Plan / ED Course  I have reviewed the triage vital signs and the nursing notes.  Pertinent labs & imaging results that were available during my care of the patient were reviewed by me and considered in my medical decision making (see chart for details).    X-ray of the right knee without significant change. Low suspicion for septic arthritis. Advised to follow-up with his orthopedist and continue NSAIDs.   Final Clinical Impressions(s) / ED Diagnoses   Final diagnoses:  Osteoarthritis of right knee, unspecified osteoarthritis type    New Prescriptions There are no discharge medications for this patient.    Julianne Rice, MD 07/17/16 1520

## 2017-06-05 ENCOUNTER — Ambulatory Visit: Payer: Medicare Other | Admitting: Family Medicine

## 2018-06-24 ENCOUNTER — Encounter: Payer: Self-pay | Admitting: Emergency Medicine

## 2018-06-24 ENCOUNTER — Ambulatory Visit
Admission: EM | Admit: 2018-06-24 | Discharge: 2018-06-24 | Disposition: A | Discharge status: Home / Self Care | Payer: Medicare HMO | Attending: Emergency Medicine | Admitting: Emergency Medicine

## 2018-06-24 DIAGNOSIS — M79604 Pain in right leg: Secondary | ICD-10-CM | POA: Diagnosis not present

## 2018-06-24 DIAGNOSIS — M7989 Other specified soft tissue disorders: Secondary | ICD-10-CM

## 2018-06-24 MED ORDER — DICLOFENAC SODIUM 75 MG PO TBEC: 75.0000 mg | DELAYED_RELEASE_TABLET | Freq: Two times a day (BID) | ORAL | 0 refills | Status: DC

## 2018-06-24 NOTE — ED Provider Notes (Signed)
[name]    CSN: [id number] Arrival date & time: 06/24/18  1239     History   Chief Complaint Chief Complaint  Patient presents with  . Knee Pain    HPI Don [name] is a 63 y.o. male history of osteoarthritis, hypertension, tobacco use presenting today for evaluation of right lower extremity swelling and pain.  Patient states that over the past week he has had increased swelling and pain to his right lower extremity and ankle.  Patient notes that he has significant arthritis in bilateral knees.  He notes that he often has fluid in his right knee.  He is concerned that the swelling has spread to his calf and ankle.  He has had associated pain with this.  Over-the-counter medicine that he states "is supposed to remove fluid".  Patient smokes approximately half pack of cigarettes a day.  Denies recent travel or immobilization.  Denies previous DVT/PE.  HPI  Past Medical History:  Diagnosis Date  . Arthritis   . 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 Chronic pain   . Depression   . Headache(784.0)   . Hypertension   . Skin cancer     There are no active problems to display for this patient.   Past Surgical History:  Procedure Laterality Date  . cartilage removed from both knees    . KNEE ARTHROSCOPY    . left wrist    . SKIN CANCER EXCISION         Home Medications    Prior to Admission medications   Medication Sig Start Date End Date Taking? Authorizing Provider  diclofenac (VOLTAREN) 75 MG EC tablet Take 1 tablet (75 mg total) by mouth 2 (two) times daily. 06/24/18   Latese Dufault, Elesa Hacker, PA-C    Family History Family History  Problem Relation Age of Onset  . Cancer Father   . Diabetes Father   . Hypertension Father     Social History Social History   Tobacco Use  . Smoking status: Current Some Day Smoker    Packs/day: 0.50    Years: 20.00    Pack years: 10.00    Types: Cigarettes  . Smokeless tobacco:  Never Used  Substance Use Topics  . Alcohol use: No  . Drug use: No     Allergies   Patient has no known allergies.   Review of Systems Review of Systems  Constitutional: Negative for fatigue and fever.  Eyes: Negative for redness, itching and visual disturbance.  Respiratory: Negative for shortness of breath.   Cardiovascular: Positive for leg swelling. Negative for chest pain.  Gastrointestinal: Negative for nausea and vomiting.  Musculoskeletal: Negative for arthralgias and myalgias.  Skin: Positive for color change. Negative for rash and wound.  Neurological: Negative for dizziness, syncope, weakness, light-headedness and headaches.     Physical Exam Triage Vital Signs ED Triage Vitals  Enc Vitals Group     BP 06/24/18 1253 (!) 162/100     Pulse Rate 06/24/18 1253 65     Resp 06/24/18 1253 16     Temp 06/24/18 1253 97.6 F (36.4 C)     Temp src --      SpO2 06/24/18 1253 94 %     Weight --      Height --      Head Circumference --      Peak Flow --      Pain Score 06/24/18 1254  9     Pain Loc --      Pain Edu? --      Excl. in [place]? --    No data found.  Updated Vital Signs BP (!) 162/100   Pulse 65   Temp 97.6 F (36.4 C)   Resp 16   SpO2 94%   Visual Acuity Right Eye Distance:   Left Eye Distance:   Bilateral Distance:    Right Eye Near:   Left Eye Near:    Bilateral Near:     Physical Exam Vitals signs and nursing note reviewed.  Constitutional:      Appearance: He is well-developed.     Comments: No acute distress  HENT:     Head: Normocephalic and atraumatic.     Nose: Nose normal.  Eyes:     Conjunctiva/sclera: Conjunctivae normal.  Neck:     Musculoskeletal: Neck supple.  Cardiovascular:     Rate and Rhythm: Normal rate.  Pulmonary:     Effort: Pulmonary effort is normal. No respiratory distress.  Abdominal:     General: There is no distension.  Musculoskeletal: Normal range of motion.     Comments: Right lower extremity with  2+ pitting edema extending up to shin, calf tenderness, no significant erythema; mildly increased warmth Mild bruising and discoloration over ankle Dorsalis pedis 2+, cap refill less than 2 seconds  No swelling observed on left extremity  Nontender to popliteal area  Skin:    General: Skin is warm and dry.  Neurological:     Mental Status: He is alert and oriented to person, place, and time.      UC Treatments / Results  Labs (all labs ordered are listed, but only abnormal results are displayed) Labs Reviewed - No data to display  EKG None  Radiology No results found.  Procedures Procedures (including critical care time)  Medications Ordered in UC Medications - No data to display  Initial Impression / Assessment and Plan / UC Course  I have reviewed the triage vital signs and the nursing notes.  Pertinent labs & imaging results that were available during my care of the patient were reviewed by me and considered in my medical decision making (see chart for details).     Patient with unilateral leg swelling, tobacco history, possible underlying DVT or Baker's cyst causing swelling and pain.  Recommending patient have ultrasound for further evaluation.  Has appointment at [facility] tomorrow at 11 AM.  In the interim advised patient to keep leg elevated, provided diclofenac to take twice daily to help with pain and swelling.  Will call patient with results, if DVT present will need anti-coagulant therapy.  If Baker's cyst, recommend continued anti-inflammatories, possible steroids.  If negative, will provide patient with a few days of Lasix.  Will inform patient of results and plan once received.Discussed strict return precautions. Patient verbalized understanding and is agreeable with plan.   Final Clinical Impressions(s) / UC Diagnoses   Final diagnoses:  Right leg swelling     Discharge Instructions     Please go to the main entrance of [facility]  tomorrow at 10:45 for your 11 oclock appointment  Ask for vascular lab/radiology In the meantime please keep foot elevated to help with swelling May take diclofenac twice daily to help with pain and swelling  I will call you with the results afterward tomorrow to discuss the plan  Overnight if you have increased pain, swelling, numbness to foot, chest  pain or shortness of breath please go to the emergency room     ED Prescriptions    Medication Sig Dispense Auth. Provider   diclofenac (VOLTAREN) 75 MG EC tablet Take 1 tablet (75 mg total) by mouth 2 (two) times daily. 30 tablet Milt Coye, [place] C, PA-C     Controlled Substance Prescriptions [place] Controlled Substance Registry consulted? Not Applicable   Janith Lima, [place] 06/24/18 1348

## 2018-06-24 NOTE — ED Notes (Signed)
APPT made at 11am 06/25/2018 for DVT study at [facility]

## 2018-06-24 NOTE — Discharge Instructions (Addendum)
Please go to the main entrance of [facility] tomorrow at 10:45 for your 11 oclock appointment  Ask for vascular lab/radiology In the meantime please keep foot elevated to help with swelling May take diclofenac twice daily to help with pain and swelling  I will call you with the results afterward tomorrow to discuss the plan  Overnight if you have increased pain, swelling, numbness to foot, chest pain or shortness of breath please go to the emergency room

## 2018-06-24 NOTE — ED Triage Notes (Signed)
Pt states he has hx of problems with his knees, states his R knee is swollen and he went to the ER "a while back to see if they would remove the fluid off my knee but they wouldn't" states the pain travels down his R leg. Denies specific injury states he needs bilateral knee replacements.

## 2018-06-25 ENCOUNTER — Telehealth (HOSPITAL_COMMUNITY): Payer: Self-pay | Admitting: Family Medicine

## 2018-06-25 ENCOUNTER — Ambulatory Visit (HOSPITAL_COMMUNITY)
Admission: RE | Admit: 2018-06-25 | Discharge: 2018-06-25 | Disposition: A | Discharge status: Home / Self Care | Payer: Medicare HMO | Source: Ambulatory Visit | Attending: Emergency Medicine | Admitting: Emergency Medicine

## 2018-06-25 ENCOUNTER — Telehealth (HOSPITAL_COMMUNITY): Payer: Self-pay | Admitting: Emergency Medicine

## 2018-06-25 ENCOUNTER — Ambulatory Visit (HOSPITAL_COMMUNITY): Payer: Medicare Other

## 2018-06-25 DIAGNOSIS — M79604 Pain in right leg: Secondary | ICD-10-CM | POA: Diagnosis present

## 2018-06-25 DIAGNOSIS — M7989 Other specified soft tissue disorders: Secondary | ICD-10-CM | POA: Diagnosis present

## 2018-06-25 MED ORDER — PREDNISONE 50 MG PO TABS: 50.0000 mg | ORAL_TABLET | Freq: Every day | ORAL | 0 refills | Status: AC

## 2018-06-25 MED ORDER — HYDROCODONE-ACETAMINOPHEN 5-325 MG PO TABS: 1.0000 | ORAL_TABLET | Freq: Four times a day (QID) | ORAL | 0 refills | Status: DC | PRN

## 2018-06-25 NOTE — Telephone Encounter (Signed)
Send in a prescription for hydrocodone per [facility] wieters PA

## 2018-06-25 NOTE — Telephone Encounter (Signed)
[place] today showing large fluid collection in popliteal area extending into calf- likely bakers cyst given his history of arthritis. Sending in Pred

## 2018-06-25 NOTE — Telephone Encounter (Signed)
[place] of right leg swelling today showing large fluid collection in popliteal area extending to calf, likely baker's cyst from arthritis. Will send in Prednisone daily for 5 days and hydrocodone for severe pain. Discussed drowsiness regarding this medicine with patient. Advised to follow up with orthopedics for further management of this and his arthritis. Follow up here in meantime if not improving.

## 2018-06-25 NOTE — Telephone Encounter (Signed)
Pharmacy change

## 2018-06-25 NOTE — Progress Notes (Signed)
Right lower extremity venous duplex exam completed.   Incidental finding: Noticed a complex fluid collection with internal sepetations, hyperechoic material and echoes from popliteal fossa extended to distal calf region. Measurement greater than 15cm in length. Cine-loop is available. Etiology unknown. If clinical concern, it may need further evaluation.  More details please see preliminary notes on CV PROC under chart review.   Result called and discussed with Ms Weters(PA) and patient was instructed to see her tomorrow.   Emila Steinhauser H Eadie Repetto(RDMS RVT) 06/25/18 4:43 PM

## 2018-06-26 ENCOUNTER — Encounter (HOSPITAL_COMMUNITY): Payer: Medicare Other

## 2018-07-09 ENCOUNTER — Ambulatory Visit: Payer: Medicare Other | Admitting: Emergency Medicine

## 2020-09-22 NOTE — Progress Notes (Signed)
Oncology Nurse Navigator Documentation  Placed introductory call to new referral patient Don [name].   Introduced myself as the H&N oncology nurse navigator that works with Dr. Isidore Moos to whom he has been referred by Dr. Winifred Olive. He confirmed understanding of referral.  Briefly explained my role as his navigator, provided my contact information.   Confirmed understanding of upcoming appts and [place] location, explained arrival and registration process.  I encouraged him to call with questions/concerns as he moves forward with appts and procedures.    He verbalized understanding of information provided, expressed appreciation for my call.   Navigator Initial Assessment . Employment Status: He is retired, but looking at doing some part time work.  . Currently on FMLA / STD: no . Living Situation: he lives alone.  . Support System: family . PCP: none . PCD: na . Financial Concerns: no . Transportation Needs: no . Sensory Deficits:no . Language Barriers/Interpreter Needed:  no . Ambulation Needs: no . DME Used in Home: no . Psychosocial Needs:  no . Concerns/Needs Understanding Cancer:  addressed/answered by navigator to best of ability . Self-Expressed Needs: no   Harlow Asa RN, BSN, OCN Head & Neck Oncology Nurse [place] at [facility] Phone # [phone]  Fax # [phone]

## 2020-09-26 ENCOUNTER — Other Ambulatory Visit: Payer: Self-pay

## 2020-09-26 ENCOUNTER — Ambulatory Visit
Admission: RE | Admit: 2020-09-26 | Discharge: 2020-09-26 | Disposition: A | Discharge status: Home / Self Care | Payer: Medicare Other | Source: Ambulatory Visit | Attending: Radiation Oncology | Admitting: Radiation Oncology

## 2020-09-26 VITALS — BP 151/89 | HR 63 | Temp 97.8°F | Resp 17 | Wt 194.2 lb

## 2020-09-26 DIAGNOSIS — C44320 Squamous cell carcinoma of skin of unspecified parts of face: Secondary | ICD-10-CM

## 2020-09-26 DIAGNOSIS — C44309 Unspecified malignant neoplasm of skin of other parts of face: Secondary | ICD-10-CM

## 2020-09-26 DIAGNOSIS — F17210 Nicotine dependence, cigarettes, uncomplicated: Secondary | ICD-10-CM | POA: Insufficient documentation

## 2020-09-26 NOTE — Progress Notes (Signed)
Radiation Oncology         [phone] ________________________________  Initial Outpatient Consultation  Name: LECIL TAPP MRN: [id number]  Date: 09/26/2020  DOB: Mar 09, 1956  SH:[id number], No Pcp Per (Inactive)  Karin Golden, MD   REFERRING PHYSICIAN: Karin Golden, MD  DIAGNOSIS:    ICD-10-CM   1. Skin cancer of forehead  C44.309 Ambulatory referral to Social Work  2. Squamous cell skin cancer, face  C44.320     Cancer Staging Squamous cell skin cancer, face Staging form: Cutaneous Carcinoma of the Head and Neck, AJCC 8th Edition - Pathologic stage from 09/26/2020: Stage III (pT3, pN0, cM0) - Signed by Eppie Gibson, MD on 09/27/2020 Stage prefix: Initial diagnosis Extraosseous extension: Absent   CHIEF COMPLAINT: Here to discuss management of skin cancer  HISTORY OF PRESENT ILLNESS::Khyree P Pontillo is a 65 y.o. male who has a history of skin cancer on the left upper lateral forehead, left frontal scalp, and left temple. Most recently, he had a lesion in the left upper lateral forehead.  This was considered a squamous cell carcinoma warranting Mohs surgery.  Subsequently, the patient was seen by Dr. Winifred Olive and underwent Mohs surgery (see below) on 09/06/2020. Pathology from the procedure revealed residual invasive carcinoma with perineural invasion.  Correspondence with Dr Winifred Olive: "I just did mohs in this patient who had a scc w/ PNI 0.32mm on the left upper lateral forehead. I got clear margins on the scc w/ PNI and closed it with and advancement flap and graft. The PNI involved the deep margins of stage 1. Attached are photos. The green shows the area of PNI.   Also I biopsied another lesion that turned out to be a different scc right next to it (red arrow). It will fall in the radiation field."  Photos from Dr. Winifred Olive:    Histology and Location of Primary Skin Cancer:    History of Blistering sunburns, if any: "A few times, a very long time ago" [place] and had a lot of sunburns as a child.  He also worked outside for much of his adult life.  He reports that he had followed with dermatology at [facility] but then stopped going to his appointments.  He is going to assume regular appointments with Dr. Winifred Olive because he is not currently following with a dermatologist for full skin exams.  He reports that he has multiple lesions on his face and his arms that he is concerned are cancerous.  He reports that he had a skin graft placed under his left eye years ago for skin cancer and believes that the crusting in that area is now as recurrence though it has been relatively stable for 3 years.    SAFETY ISSUES:  Prior radiation? No   Pacemaker/ICD? No  Possible current pregnancy? N/A  Is the patient on methotrexate? No  Current Complaints / other details: He is due for his COVID booster (third shot)   PREVIOUS RADIATION THERAPY: No  PAST MEDICAL HISTORY:  has a past medical history of Arthritis, Cancer ([place]), Chronic pain, Depression, Headache(784.0), Hypertension, and Skin cancer.    PAST SURGICAL HISTORY: Past Surgical History:  Procedure Laterality Date  . cartilage removed from both knees    . KNEE ARTHROSCOPY    . left wrist    . SKIN CANCER EXCISION      FAMILY HISTORY: family history includes Cancer in his father; Diabetes in his father; Hypertension in his father.  SOCIAL HISTORY:  reports that he has been smoking cigarettes. He has a 10.00 pack-year smoking history. He has never used smokeless tobacco. He reports that he does not drink alcohol and does not use drugs.  ALLERGIES: Patient has no known allergies.  MEDICATIONS:  Current Outpatient Medications  Medication Sig Dispense Refill  . Multiple Vitamins-Minerals (MULTIVITAMIN WITH MINERALS) tablet Take 1 tablet by mouth daily.    . diclofenac (VOLTAREN) 75 MG EC tablet Take 1 tablet (75 mg total) by mouth 2 (two) times daily. 30 tablet 0  . HYDROcodone-acetaminophen  (NORCO/VICODIN) 5-325 MG tablet Take 1 tablet by mouth every 6 (six) hours as needed. 8 tablet 0  . losartan (COZAAR) 50 MG tablet Take 1 tablet by mouth daily.     No current facility-administered medications for this encounter.    REVIEW OF SYSTEMS:  Notable for that above.   PHYSICAL EXAM:  weight is 194 lb 4 oz (88.1 kg). His oral temperature is 97.8 F (36.6 C). His blood pressure is 151/89 (abnormal) and his pulse is 63. His respiration is 17 and oxygen saturation is 99%.   General: Alert and oriented, in no acute distress  HEENT: Head is normocephalic.  Neck: Neck is supple, no palpable cervical or supraclavicular lymphadenopathy nor any periauricular adenopathy. Skin: See photograph above from site of Mohs surgery on left forehead (this photograph was previously taken by his skin surgeon).  Satisfactory healing of scar noted today. He has evidence of sun damage diffusely throughout his skin and is difficult to tell which lesions are precancerous or cancerous. Musculoskeletal: Ambulatory Psychiatric: Judgment and insight are intact. Affect is appropriate.   ECOG = 1  0 - Asymptomatic (Fully active, able to carry on all predisease activities without restriction)  1 - Symptomatic but completely ambulatory (Restricted in physically strenuous activity but ambulatory and able to carry out work of a light or sedentary nature. For example, light housework, office work)  2 - Symptomatic, <50% in bed during the day (Ambulatory and capable of all self care but unable to carry out any work activities. Up and about more than 50% of waking hours)  3 - Symptomatic, >50% in bed, but not bedbound (Capable of only limited self-care, confined to bed or chair 50% or more of waking hours)  4 - Bedbound (Completely disabled. Cannot carry on any self-care. Totally confined to bed or chair)  5 - Death   Eustace Pen MM, Creech RH, Tormey DC, et al. [phone]). "Toxicity and response criteria of the [facility]". [place] Oncol. 5 (6): 649-55   LABORATORY DATA:  Lab Results  Component Value Date   WBC 11.8 (H) 12/17/2013   HGB 15.8 12/17/2013   HCT 45.8 12/17/2013   MCV 91.8 12/17/2013   PLT 221 12/17/2013   CMP     Component Value Date/Time   NA 143 12/17/2013 1644   K 4.8 12/17/2013 1644   CL 104 12/17/2013 1644   CO2 27 12/17/2013 1644   GLUCOSE 100 (H) 12/17/2013 1644   BUN 10 12/17/2013 1644   CREATININE 0.78 12/17/2013 1644   CALCIUM 9.8 12/17/2013 1644   PROT 7.2 12/17/2013 1644   ALBUMIN 4.4 12/17/2013 1644   AST 17 12/17/2013 1644   ALT 20 12/17/2013 1644   ALKPHOS 64 12/17/2013 1644   BILITOT 0.6 12/17/2013 1644   GFRNONAA >90 12/17/2013 1644   GFRAA >90 12/17/2013 1644         RADIOGRAPHY: No results found.    IMPRESSION/PLAN:  Skin cancer - left forehead.  The patient has evidence of diffuse sun damage throughout his skin.  He is concerned that he may have other cancers at sites other than his left forehead.  I personally spoke with Dr. Winifred Olive today and he states that he will do a full skin exam and biopsy areas of concern after the patient completes radiation to his left forehead.  He believes that other skin lesions will be treatable with surgery.  I talked to the patient about the findings and work-up thus far. We discussed the patient's diagnosis of advanced skin cancer to the left forehead with significant perineural invasion and general treatment for this, highlighting the role of radiotherapy in the management. We discussed the available radiation techniques, and focused on the details of logistics and delivery.  I recommend 4 weeks of hypofractionated radiation therapy to the left forehead/anterior scalp  We discussed the risks, benefits, and side effects of radiotherapy. Side effects may include but not necessarily be limited to: Skin irritation, permanent hair loss, soft tissue injury, permanent skin changes, fatigue.   Electron therapy will be used and exposure to the brain will be minimized so that risk of brain injury is minimized. No guarantees of treatment were given. A consent form was signed and placed in the patient's medical record. The patient was encouraged to ask questions that I answered to the best of my ability.   We will proceed with CT simulation in the near future.  Social work consulted today to provide an extra layer of support for transportation or other needs.  We discussed measures to reduce the risk of infection during the COVID-19 pandemic.  He is due for his third shot.  I offered to get him scheduled for shot here in the cancer center.  He wishes to get the shot with his wife at their local pharmacy.  I encouraged him to arrange this ASAP and he states that he will.   On date of service, in total, I spent 50 minutes on this encounter. Patient was seen in person.   __________________________________________   Eppie Gibson, MD  This document serves as a record of services personally performed by Eppie Gibson, MD. It was created on his behalf by Clerance Lav, a trained medical scribe. The creation of this record is based on the scribe's personal observations and the provider's statements to them. This document has been checked and approved by the attending provider.

## 2020-09-26 NOTE — Progress Notes (Signed)
Oncology Nurse Navigator Documentation  Met with patient during initial consult with Dr. Isidore Moos.  . Further introduced myself as his Navigator, explained my role as a member of the Care Team. . Assisted with post-consult appt scheduling.  Marland Kitchen He verbalized understanding of information provided. . I encouraged him to call with questions/concerns moving forward.  Harlow Asa, RN, BSN, OCN Head & Neck Oncology Nurse [place] at [place] [phone]

## 2020-09-26 NOTE — Progress Notes (Signed)
Histology and Location of Primary Skin Cancer:    Past/Anticipated interventions by patient's surgeon/dermatologist for current problematic lesion, if any: 09/06/2020 Dr. Karin Golden Mohs surgery   Past skin cancers, if any: Problem 1: Squamous Cell Carcinoma, left upper lateral forehead  Duration: year(s)  Severity: constant  Signs/Symptoms: pain, tenderness, irritation, scale formation  Context: change in size  Modifying Factors: sun exposure  PLAN: Mohs surgery to be performed today (09/06/20) Problem 2: Neoplasm of Uncertain Behavior, left frontal scalp  Context: change in size  Modifying Factors: sun exposure  PLAN: Biopsy with Frozen Section Problem 3: Neoplasm of Uncertain Behavior, left temple  Context: change in size  Modifying Factors: sun exposure  PLAN: Biopsy with Frozen Section Problem 4: Squamous Cell Carcinoma, left frontal scalp  Duration: year(s)  Severity: constant  Signs/Symptoms: pain, tenderness, irritation, scale formation  Context: change in size  Modifying Factors: sun exposure  PLAN: Mohs surgery to be performed today Problem 5: Squamous Cell Carcinoma, left temple  Duration: year(s)  Severity: constant  Signs/Symptoms: pain, tenderness, irritation, scale formation  Context: change in size  Modifying Factors: sun exposure  PLAN: Mohs surgery  History of Blistering sunburns, if any: "A few times, a very long time ago"  SAFETY ISSUES:  Prior radiation? No   Pacemaker/ICD? No  Possible current pregnancy? N/A  Is the patient on methotrexate? No  Current Complaints / other details:  Nothing else of note

## 2020-09-27 ENCOUNTER — Encounter: Payer: Self-pay | Admitting: Licensed Clinical Social Worker

## 2020-09-27 ENCOUNTER — Encounter: Payer: Self-pay | Admitting: Radiation Oncology

## 2020-09-27 DIAGNOSIS — C44309 Unspecified malignant neoplasm of skin of other parts of face: Secondary | ICD-10-CM | POA: Insufficient documentation

## 2020-09-27 DIAGNOSIS — C44320 Squamous cell carcinoma of skin of unspecified parts of face: Secondary | ICD-10-CM | POA: Insufficient documentation

## 2020-09-27 NOTE — Progress Notes (Signed)
[place] Work  Clinical Social Work was referred by Dr. Isidore Moos for assessment of psychosocial needs.  Clinical Social Worker contacted patient by phone  to offer support and assess for needs.   Provided supportive counsel around stressors with cancer diagnosis. Patient is also very concerned about housing situation as he & his wife rent a home, but have found out that their landlord has not paid the property tax. They may want to look for a new place due to concern about this home being foreclosed. CSW provided information on [employer] and [employer] for assistance with the search.  Patient denied any needs with transportation or other resources at this time.  Patient also had more questions after visit yesterday about how he might feel during radiation and about taking vitamins. CSW will refer questions to medical team for assistance.  Provided direct contact information to patient for any further support services needs.   [place], [place] Worker [employer]

## 2020-09-29 NOTE — Progress Notes (Signed)
Oncology Nurse Navigator Documentation  I was notified by SW that Mr. Don [name] had further questions regarding his upcoming radiation treatment. I called Don [name] and answered his questions to his satisfaction. He is aware that he will follow up with Dr. Winifred Olive after his radiation treatments are completed regarding other skin cancers that need to be worked up. He is also aware that he will be receiving a phone call to set up his radiation planning session. He knows to call me if I can be of any more assistance to him.   Harlow Asa RN, BSN, OCN Head & Neck Oncology Nurse [place] at [facility] Phone # [phone]  Fax # [phone]

## 2020-10-10 ENCOUNTER — Ambulatory Visit
Admission: RE | Admit: 2020-10-10 | Discharge: 2020-10-10 | Disposition: A | Discharge status: Home / Self Care | Payer: Medicare Other | Source: Ambulatory Visit | Attending: Radiation Oncology | Admitting: Radiation Oncology

## 2020-10-10 ENCOUNTER — Other Ambulatory Visit: Payer: Self-pay

## 2020-10-10 DIAGNOSIS — Z510 Encounter for antineoplastic radiation therapy: Secondary | ICD-10-CM | POA: Diagnosis not present

## 2020-10-10 DIAGNOSIS — C44309 Unspecified malignant neoplasm of skin of other parts of face: Secondary | ICD-10-CM | POA: Insufficient documentation

## 2020-10-18 DIAGNOSIS — C44309 Unspecified malignant neoplasm of skin of other parts of face: Secondary | ICD-10-CM | POA: Diagnosis not present

## 2020-10-19 ENCOUNTER — Ambulatory Visit: Payer: Medicare Other | Admitting: Radiation Oncology

## 2020-10-20 ENCOUNTER — Ambulatory Visit: Payer: Medicare Other

## 2020-10-23 ENCOUNTER — Other Ambulatory Visit: Payer: Self-pay

## 2020-10-23 ENCOUNTER — Ambulatory Visit
Admission: RE | Admit: 2020-10-23 | Discharge: 2020-10-23 | Disposition: A | Discharge status: Home / Self Care | Payer: Medicare Other | Source: Ambulatory Visit | Attending: Radiation Oncology | Admitting: Radiation Oncology

## 2020-10-23 DIAGNOSIS — C44309 Unspecified malignant neoplasm of skin of other parts of face: Secondary | ICD-10-CM | POA: Diagnosis not present

## 2020-10-24 ENCOUNTER — Ambulatory Visit
Admission: RE | Admit: 2020-10-24 | Discharge: 2020-10-24 | Disposition: A | Discharge status: Home / Self Care | Payer: Medicare Other | Source: Ambulatory Visit | Attending: Radiation Oncology | Admitting: Radiation Oncology

## 2020-10-24 ENCOUNTER — Other Ambulatory Visit: Payer: Self-pay

## 2020-10-24 DIAGNOSIS — C44309 Unspecified malignant neoplasm of skin of other parts of face: Secondary | ICD-10-CM | POA: Diagnosis not present

## 2020-10-25 ENCOUNTER — Ambulatory Visit
Admission: RE | Admit: 2020-10-25 | Discharge: 2020-10-25 | Disposition: A | Discharge status: Home / Self Care | Payer: Medicare Other | Source: Ambulatory Visit | Attending: Radiation Oncology | Admitting: Radiation Oncology

## 2020-10-25 ENCOUNTER — Other Ambulatory Visit: Payer: Self-pay

## 2020-10-25 DIAGNOSIS — C44309 Unspecified malignant neoplasm of skin of other parts of face: Secondary | ICD-10-CM

## 2020-10-25 MED ORDER — SONAFINE EX EMUL
1.0000 "application " | Freq: Two times a day (BID) | CUTANEOUS | Status: DC
  Administered 2020-10-25: 1 via TOPICAL

## 2020-10-25 NOTE — Addendum Note (Signed)
Encounter addended by: Zola Button, RN on: 10/25/2020 2:41 PM  Actions taken: [facility] administration accepted

## 2020-10-25 NOTE — Addendum Note (Signed)
Encounter addended by: Zola Button, RN on: 10/25/2020 2:39 PM  Actions taken: Allergies reviewed, Order list changed, Diagnosis association updated, Clinical Note Signed

## 2020-10-25 NOTE — Progress Notes (Signed)
Pt here for patient teaching.    Pt given Radiation and You booklet, skin care instructions and Sonafine.    Reviewed areas of pertinence such as fatigue, hair loss and skin changes .   Pt able to give teach back of to pat skin, use unscented/gentle soap and drink plenty of water,apply Sonafine bid and avoid applying anything to skin within 4 hours of treatment.   Pt needs reinforcement of information given and will contact nursing with any questions or concerns.  Will continue to reinforce information at each weekly PUT visit  [email or web address]

## 2020-10-26 ENCOUNTER — Ambulatory Visit
Admission: RE | Admit: 2020-10-26 | Discharge: 2020-10-26 | Disposition: A | Discharge status: Home / Self Care | Payer: Medicare Other | Source: Ambulatory Visit | Attending: Radiation Oncology | Admitting: Radiation Oncology

## 2020-10-26 ENCOUNTER — Encounter: Payer: Self-pay | Admitting: Licensed Clinical Social Worker

## 2020-10-26 ENCOUNTER — Telehealth: Payer: Self-pay | Admitting: Licensed Clinical Social Worker

## 2020-10-26 DIAGNOSIS — C44309 Unspecified malignant neoplasm of skin of other parts of face: Secondary | ICD-10-CM | POA: Diagnosis not present

## 2020-10-26 NOTE — Telephone Encounter (Signed)
Attempted to reach patient again per request this morning. No answer. Left VM stating that the information discussed earlier will be left at front desk for him. Provided direct contact information for pt to call with any questions.    Christeen Douglas, LCSW

## 2020-10-26 NOTE — Progress Notes (Signed)
[place] CSW Progress Note  Clinical Education officer, museum contacted patient by phone to follow-up on psychosocial needs per referral from RN. CSW spoke with patient briefly and he is overwhelmed and "doesn't know where to start". CSW inquired about if he has connected with [employer] for support in housing search as discussed during last conversation but he has not.  He is also potentially open to additional support groups around sobriety but requested a call back as he is in the car and unable to write information at this time. CSW will call back later today.    Christeen Douglas , LCSW

## 2020-10-27 ENCOUNTER — Other Ambulatory Visit: Payer: Self-pay

## 2020-10-27 ENCOUNTER — Ambulatory Visit
Admission: RE | Admit: 2020-10-27 | Discharge: 2020-10-27 | Disposition: A | Discharge status: Home / Self Care | Payer: Medicare Other | Source: Ambulatory Visit | Attending: Radiation Oncology | Admitting: Radiation Oncology

## 2020-10-27 DIAGNOSIS — C44309 Unspecified malignant neoplasm of skin of other parts of face: Secondary | ICD-10-CM | POA: Diagnosis not present

## 2020-10-31 ENCOUNTER — Ambulatory Visit
Admission: RE | Admit: 2020-10-31 | Discharge: 2020-10-31 | Disposition: A | Discharge status: Home / Self Care | Payer: Medicare Other | Source: Ambulatory Visit | Attending: Radiation Oncology | Admitting: Radiation Oncology

## 2020-10-31 ENCOUNTER — Other Ambulatory Visit: Payer: Self-pay

## 2020-10-31 DIAGNOSIS — C44309 Unspecified malignant neoplasm of skin of other parts of face: Secondary | ICD-10-CM | POA: Diagnosis not present

## 2020-11-01 ENCOUNTER — Ambulatory Visit
Admission: RE | Admit: 2020-11-01 | Discharge: 2020-11-01 | Disposition: A | Discharge status: Home / Self Care | Payer: Medicare Other | Source: Ambulatory Visit | Attending: Radiation Oncology | Admitting: Radiation Oncology

## 2020-11-01 DIAGNOSIS — C44309 Unspecified malignant neoplasm of skin of other parts of face: Secondary | ICD-10-CM | POA: Diagnosis present

## 2020-11-01 DIAGNOSIS — Z510 Encounter for antineoplastic radiation therapy: Secondary | ICD-10-CM | POA: Diagnosis not present

## 2020-11-02 ENCOUNTER — Ambulatory Visit
Admission: RE | Admit: 2020-11-02 | Discharge: 2020-11-02 | Disposition: A | Discharge status: Home / Self Care | Payer: Medicare Other | Source: Ambulatory Visit | Attending: Radiation Oncology | Admitting: Radiation Oncology

## 2020-11-02 ENCOUNTER — Other Ambulatory Visit: Payer: Self-pay

## 2020-11-02 DIAGNOSIS — C44309 Unspecified malignant neoplasm of skin of other parts of face: Secondary | ICD-10-CM | POA: Diagnosis not present

## 2020-11-02 NOTE — Progress Notes (Signed)
Spoke with patient he is concerned about bleeding lesion in his right ear. He has an ulcerated lesion to inner pina of ear that is approximately 2 cm in diameter. He has scabbing around the area also. As Dr Isidore Moos is not here today he was advised to come tomorrow after treatment to see her for her assessment.

## 2020-11-03 ENCOUNTER — Ambulatory Visit: Payer: Medicare Other

## 2020-11-06 ENCOUNTER — Ambulatory Visit
Admission: RE | Admit: 2020-11-06 | Discharge: 2020-11-06 | Disposition: A | Discharge status: Home / Self Care | Payer: Medicare Other | Source: Ambulatory Visit | Attending: Radiation Oncology | Admitting: Radiation Oncology

## 2020-11-06 DIAGNOSIS — C44309 Unspecified malignant neoplasm of skin of other parts of face: Secondary | ICD-10-CM | POA: Diagnosis not present

## 2020-11-07 ENCOUNTER — Ambulatory Visit: Payer: Medicare Other

## 2020-11-08 ENCOUNTER — Other Ambulatory Visit: Payer: Self-pay

## 2020-11-08 ENCOUNTER — Ambulatory Visit
Admission: RE | Admit: 2020-11-08 | Discharge: 2020-11-08 | Disposition: A | Discharge status: Home / Self Care | Payer: Medicare Other | Source: Ambulatory Visit | Attending: Radiation Oncology | Admitting: Radiation Oncology

## 2020-11-08 DIAGNOSIS — C44309 Unspecified malignant neoplasm of skin of other parts of face: Secondary | ICD-10-CM | POA: Diagnosis not present

## 2020-11-09 ENCOUNTER — Ambulatory Visit
Admission: RE | Admit: 2020-11-09 | Discharge: 2020-11-09 | Disposition: A | Discharge status: Home / Self Care | Payer: Medicare Other | Source: Ambulatory Visit | Attending: Radiation Oncology | Admitting: Radiation Oncology

## 2020-11-09 DIAGNOSIS — C44309 Unspecified malignant neoplasm of skin of other parts of face: Secondary | ICD-10-CM | POA: Diagnosis not present

## 2020-11-10 ENCOUNTER — Ambulatory Visit
Admission: RE | Admit: 2020-11-10 | Discharge: 2020-11-10 | Disposition: A | Discharge status: Home / Self Care | Payer: Medicare Other | Source: Ambulatory Visit | Attending: Radiation Oncology | Admitting: Radiation Oncology

## 2020-11-10 ENCOUNTER — Other Ambulatory Visit: Payer: Self-pay

## 2020-11-10 DIAGNOSIS — C44309 Unspecified malignant neoplasm of skin of other parts of face: Secondary | ICD-10-CM | POA: Diagnosis not present

## 2020-11-13 ENCOUNTER — Other Ambulatory Visit: Payer: Self-pay

## 2020-11-13 ENCOUNTER — Ambulatory Visit
Admission: RE | Admit: 2020-11-13 | Discharge: 2020-11-13 | Disposition: A | Discharge status: Home / Self Care | Payer: Medicare Other | Source: Ambulatory Visit | Attending: Radiation Oncology | Admitting: Radiation Oncology

## 2020-11-13 DIAGNOSIS — C44309 Unspecified malignant neoplasm of skin of other parts of face: Secondary | ICD-10-CM | POA: Diagnosis not present

## 2020-11-14 ENCOUNTER — Ambulatory Visit
Admission: RE | Admit: 2020-11-14 | Discharge: 2020-11-14 | Disposition: A | Discharge status: Home / Self Care | Payer: Medicare Other | Source: Ambulatory Visit | Attending: Radiation Oncology | Admitting: Radiation Oncology

## 2020-11-14 ENCOUNTER — Encounter: Payer: Self-pay | Admitting: Licensed Clinical Social Worker

## 2020-11-14 DIAGNOSIS — C44309 Unspecified malignant neoplasm of skin of other parts of face: Secondary | ICD-10-CM | POA: Diagnosis not present

## 2020-11-14 NOTE — Progress Notes (Signed)
[place] CSW Progress Note  Holiday representative met with patient after radiation appt to sign up for and give first installment of [employer]. Instructed patient how to receive next set of cards next week. Reviewed other information discussed on phone earlier regarding transportation.  CSW provided card with direct contact information for any further needs.    Christeen Douglas , LCSW

## 2020-11-14 NOTE — Progress Notes (Signed)
[place] CSW Progress Note  Clinical Education officer, museum contacted patient by phone to assess for current needs and offer support.  Patient is struggling financially as well as with increased depression.  CSW provided supportive counsel and discussed different resources that may be able to help patient.  He previously declined Advertising account executive, but is open now to assistance- CSW sent message to financial advocates regarding grant. Pt declined referral to [employer] at this time.  Transportation: using [facility] transportation. Pt will call insurance for transportation help to non-[facility] appts. CSW also informed pt of [facility] [place] transport but he wants to explore insurance option first.  Food: Patient has received a SNAP/ food stamps card, but states that there is no money on it. He will follow-up with social services. CSW offered bags of food from [employer] but patient declined. He is open to signing up for [employer] and CSW will see pt during appt today.  Housing: patient has been in contact with [employer] who can help identify available housing in pt's price range. Few resources are available for other housing at this time as patient does not meet the federal definition for homelessness since he is in housing.  Mental health support: Patient open to referral for counseling around his longstanding history of depression. Referral made to [facility] today.    Christeen Douglas , LCSW

## 2020-11-15 ENCOUNTER — Ambulatory Visit
Admission: RE | Admit: 2020-11-15 | Discharge: 2020-11-15 | Disposition: A | Discharge status: Home / Self Care | Payer: Medicare Other | Source: Ambulatory Visit | Attending: Radiation Oncology | Admitting: Radiation Oncology

## 2020-11-15 ENCOUNTER — Other Ambulatory Visit: Payer: Self-pay

## 2020-11-15 DIAGNOSIS — C44309 Unspecified malignant neoplasm of skin of other parts of face: Secondary | ICD-10-CM | POA: Diagnosis not present

## 2020-11-16 ENCOUNTER — Ambulatory Visit
Admission: RE | Admit: 2020-11-16 | Discharge: 2020-11-16 | Disposition: A | Discharge status: Home / Self Care | Payer: Medicare Other | Source: Ambulatory Visit | Attending: Radiation Oncology | Admitting: Radiation Oncology

## 2020-11-16 ENCOUNTER — Ambulatory Visit: Payer: Medicare Other

## 2020-11-16 DIAGNOSIS — C44309 Unspecified malignant neoplasm of skin of other parts of face: Secondary | ICD-10-CM | POA: Diagnosis not present

## 2020-11-17 ENCOUNTER — Ambulatory Visit
Admission: RE | Admit: 2020-11-17 | Discharge: 2020-11-17 | Disposition: A | Discharge status: Home / Self Care | Payer: Medicare Other | Source: Ambulatory Visit | Attending: Radiation Oncology | Admitting: Radiation Oncology

## 2020-11-17 ENCOUNTER — Other Ambulatory Visit: Payer: Self-pay

## 2020-11-17 DIAGNOSIS — C44309 Unspecified malignant neoplasm of skin of other parts of face: Secondary | ICD-10-CM | POA: Diagnosis not present

## 2020-11-20 ENCOUNTER — Encounter: Payer: Self-pay | Admitting: General Practice

## 2020-11-20 ENCOUNTER — Ambulatory Visit: Payer: Medicare Other

## 2020-11-20 NOTE — Progress Notes (Signed)
[facility] Spiritual Care Note  Caught Don [name] by phone for brief pastoral check-in. He was in good spirits, noting that he had just taken a moment to "thank God [that he is] a lucky man." His time was limited, so we plan for me to follow up by phone later today or tomorrow. He welcomes a voicemail if we miss each other.   [place], [place], [facility] Pager [phone] Voicemail [phone]

## 2020-11-20 NOTE — Progress Notes (Signed)
[facility] Spiritual Care Note  Referred by rad onc team (Dr Isidore Moos and Willodean Rosenthal) as an additional layer of support alongside that of [facility] Z[place]ala/LCSW. Have repeatedly missed Mr Dykman at radiation treatment, as well as by phone today, but will keep trying.   [place], [place], [facility] Pager [phone] Voicemail [phone]

## 2020-11-21 ENCOUNTER — Encounter: Payer: Self-pay | Admitting: General Practice

## 2020-11-21 ENCOUNTER — Ambulatory Visit: Payer: Medicare Other

## 2020-11-21 NOTE — Progress Notes (Signed)
[place] Spiritual Care Note  Attempted to catch Don [name] at radiation treatment; waited 15 minutes, but he did not show during that time. Will continue trying to reach him.   [place], [place], [facility] Pager [phone] Voicemail [phone]

## 2020-11-21 NOTE — Progress Notes (Signed)
[facility] Spiritual Care Note  Repeat calls to mobile lead to full voicemail. Left simple message on home number, encouraging return call.   [place], [place], [facility] Pager [phone] Voicemail [phone]

## 2020-11-22 ENCOUNTER — Ambulatory Visit: Payer: Medicare Other

## 2020-11-23 ENCOUNTER — Ambulatory Visit: Payer: Medicare Other

## 2020-11-24 ENCOUNTER — Ambulatory Visit: Payer: Medicare Other

## 2020-11-27 ENCOUNTER — Ambulatory Visit: Payer: Medicare Other

## 2020-11-28 ENCOUNTER — Other Ambulatory Visit: Payer: Self-pay

## 2020-11-28 ENCOUNTER — Ambulatory Visit
Admission: RE | Admit: 2020-11-28 | Discharge: 2020-11-28 | Disposition: A | Discharge status: Home / Self Care | Payer: Medicare Other | Source: Ambulatory Visit | Attending: Radiation Oncology | Admitting: Radiation Oncology

## 2020-11-28 DIAGNOSIS — C44309 Unspecified malignant neoplasm of skin of other parts of face: Secondary | ICD-10-CM | POA: Diagnosis not present

## 2020-11-28 DIAGNOSIS — C44320 Squamous cell carcinoma of skin of unspecified parts of face: Secondary | ICD-10-CM

## 2020-11-28 MED ORDER — SONAFINE EX EMUL: 1.0000 "application " | Freq: Once | CUTANEOUS | Status: DC

## 2020-11-29 ENCOUNTER — Ambulatory Visit
Admission: RE | Admit: 2020-11-29 | Discharge: 2020-11-29 | Disposition: A | Discharge status: Home / Self Care | Payer: Medicare Other | Source: Ambulatory Visit | Attending: Radiation Oncology | Admitting: Radiation Oncology

## 2020-11-29 ENCOUNTER — Ambulatory Visit: Payer: Medicare Other

## 2020-11-29 DIAGNOSIS — C44309 Unspecified malignant neoplasm of skin of other parts of face: Secondary | ICD-10-CM | POA: Diagnosis not present

## 2020-11-30 ENCOUNTER — Ambulatory Visit
Admission: RE | Admit: 2020-11-30 | Discharge: 2020-11-30 | Disposition: A | Discharge status: Home / Self Care | Payer: Medicare Other | Source: Ambulatory Visit | Attending: Radiation Oncology | Admitting: Radiation Oncology

## 2020-11-30 ENCOUNTER — Encounter: Payer: Self-pay | Admitting: Radiation Oncology

## 2020-11-30 ENCOUNTER — Other Ambulatory Visit: Payer: Self-pay

## 2020-11-30 DIAGNOSIS — C44309 Unspecified malignant neoplasm of skin of other parts of face: Secondary | ICD-10-CM | POA: Diagnosis not present

## 2020-12-22 ENCOUNTER — Telehealth: Payer: Self-pay | Admitting: *Deleted

## 2020-12-22 ENCOUNTER — Ambulatory Visit: Payer: Medicare Other | Admitting: Radiation Oncology

## 2020-12-22 NOTE — Telephone Encounter (Signed)
CALLED PATIENT TO ASK ABOUT RESCHEDULING MISSED FU FOR 12-22-20, LVM FOR A RETURN CALL

## 2021-01-29 NOTE — Progress Notes (Signed)
                                                                                                                                                             Patient Name: Don [name] MRN: [id number] DOB: 1956/03/31 Referring Physician: Karin Golden Date of Service: 11/30/2020 [facility], [place]                                                        End Of Treatment Note  Diagnoses: C44.309-Unspecified malignant neoplasm of skin of other parts of face  Cancer Staging: Cancer Staging Squamous cell skin cancer, face Staging form: Cutaneous Carcinoma of the Head and Neck, AJCC 8th Edition - Pathologic stage from 09/26/2020: Stage III (pT3, pN0, cM0) - Signed by Eppie Gibson, MD on 09/27/2020 Stage prefix: Initial diagnosis Extraosseous extension: Absent  Intent: Curative  Radiation Treatment Dates: 10/23/2020 through 11/30/2020 Site Technique Total Dose (Gy) Dose per Fx (Gy) Completed Fx Beam Energies  Face: HN_L_foreh 3D 50/50 2.5 20/20 6E   Narrative: The patient tolerated radiation therapy relatively well. Due to social stressors, his treatment course was protracted; multiple visits were no-shows but he ultimately finished his course with extra support from our team.  Plan: The patient will follow-up with radiation oncology in 47mo.  -----------------------------------  SEppie Gibson MD

## 2021-01-29 NOTE — Progress Notes (Signed)
Oncology Nurse Navigator Documentation   At Dr. Pearlie Oyster request I called Mr. Pippenger to check on him. He missed his follow up with Dr. Isidore Moos after completing his recent radiation treatment. I left a voice mail advising him that Dr. Isidore Moos wanted to see him for follow up to assess radiation treatment. I left him my direct contact information and asked that he call me back with an update when he was able.   Harlow Asa RN, BSN, OCN Head & Neck Oncology Nurse [place] at [facility] Phone # [phone]  Fax # [phone]

## 2021-05-17 ENCOUNTER — Encounter: Payer: Self-pay | Admitting: Emergency Medicine

## 2021-05-17 ENCOUNTER — Ambulatory Visit
Admission: EM | Admit: 2021-05-17 | Discharge: 2021-05-17 | Disposition: A | Discharge status: Home / Self Care | Payer: Medicare Other | Attending: Physician Assistant | Admitting: Physician Assistant

## 2021-05-17 DIAGNOSIS — C44202 Unspecified malignant neoplasm of skin of right ear and external auricular canal: Secondary | ICD-10-CM | POA: Diagnosis not present

## 2021-05-17 MED ORDER — DOXYCYCLINE HYCLATE 100 MG PO CAPS: 100.0000 mg | ORAL_CAPSULE | Freq: Two times a day (BID) | ORAL | 0 refills | Status: DC

## 2021-05-17 NOTE — Discharge Instructions (Signed)
°  Please follow up with Dermatology at 3 PM on Monday 05/21/2021.

## 2021-05-17 NOTE — ED Provider Notes (Signed)
[name]    CSN: [id number] Arrival date & time: 05/17/21  1244      History   Chief Complaint Chief Complaint  Patient presents with   Otalgia    HPI Don [name] is a 65 y.o. male.   Patient here today for evaluation of right ear pain and discomfort that started sometime ago.  He states he has noticed some drainage from the outside of his ear as well.  He has known skin cancer and suspects this is what is present under his left eye as well as below his left ear.  The history is provided by the patient.  Otalgia Associated symptoms: no fever    Past Medical History:  Diagnosis Date   Arthritis    Cancer ([place])    squamous cell "all in my face arms and hands"   Chronic pain    Depression    Headache(784.0)    Hypertension    Skin cancer     Patient Active Problem List   Diagnosis Date Noted   Skin cancer of forehead 09/27/2020   Squamous cell skin cancer, face 09/27/2020    Past Surgical History:  Procedure Laterality Date   cartilage removed from both knees     KNEE ARTHROSCOPY     left wrist     SKIN CANCER EXCISION         Home Medications    Prior to Admission medications   Medication Sig Start Date End Date Taking? Authorizing Provider  doxycycline (VIBRAMYCIN) 100 MG capsule Take 1 capsule (100 mg total) by mouth 2 (two) times daily. 05/17/21  Yes Francene Finders, PA-C  diclofenac (VOLTAREN) 75 MG EC tablet Take 1 tablet (75 mg total) by mouth 2 (two) times daily. 06/24/18   Wieters, Hallie C, PA-C  HYDROcodone-acetaminophen (NORCO/VICODIN) 5-325 MG tablet Take 1 tablet by mouth every 6 (six) hours as needed. 06/25/18   Loura Halt A, NP  losartan (COZAAR) 50 MG tablet Take 1 tablet by mouth daily. 09/06/20   [provider]  Multiple Vitamins-Minerals (MULTIVITAMIN WITH MINERALS) tablet Take 1 tablet by mouth daily.    [provider]    Family History Family History  Problem Relation Age of Onset   Cancer  Father    Diabetes Father    Hypertension Father     Social History Social History   Tobacco Use   Smoking status: Some Days    Packs/day: 0.50    Years: 20.00    Pack years: 10.00    Types: Cigarettes   Smokeless tobacco: Never  Substance Use Topics   Alcohol use: No   Drug use: No     Allergies   Patient has no known allergies.   Review of Systems Review of Systems  Constitutional:  Negative for chills and fever.  HENT:  Positive for ear pain.   Eyes:  Negative for discharge and redness.  Respiratory:  Negative for shortness of breath.   Skin:  Positive for color change and wound.  Neurological:  Negative for numbness.    Physical Exam Triage Vital Signs ED Triage Vitals  Enc Vitals Group     BP 05/17/21 1450 (!) 175/94     Pulse Rate 05/17/21 1450 60     Resp --      Temp 05/17/21 1450 97.6 F (36.4 C)     Temp Source 05/17/21 1450 Oral     SpO2 05/17/21 1450 93 %     Weight 05/17/21  1451 190 lb (86.2 kg)     Height 05/17/21 1451 5\' 10"  (1.778 m)     Head Circumference --      Peak Flow --      Pain Score 05/17/21 1451 8     Pain Loc --      Pain Edu? --      Excl. in [place]? --    No data found.  Updated Vital Signs BP (!) 175/94 (BP Location: Left Arm)    Pulse 60    Temp 97.6 F (36.4 C) (Oral)    Ht 5\' 10"  (1.778 m)    Wt 190 lb (86.2 kg)    SpO2 93%    BMI 27.26 kg/m      Physical Exam Vitals and nursing note reviewed.  Constitutional:      General: He is not in acute distress.    Appearance: Normal appearance. He is not ill-appearing.  HENT:     Head: Normocephalic and atraumatic.  Eyes:     Conjunctiva/sclera: Conjunctivae normal.  Cardiovascular:     Rate and Rhythm: Normal rate.  Pulmonary:     Effort: Pulmonary effort is normal.  Skin:    Comments: Right auricle with significant desquamation, yellow appearing drainage.  Multiple other areas suspicious for skin cancer to left maxillary area as well as to neck distal to left ear.   Neurological:     Mental Status: He is alert.  Psychiatric:        Mood and Affect: Mood normal.        Behavior: Behavior normal.        Thought Content: Thought content normal.     UC Treatments / Results  Labs (all labs ordered are listed, but only abnormal results are displayed) Labs Reviewed - No data to display  EKG   Radiology No results found.  Procedures Procedures (including critical care time)  Medications Ordered in UC Medications - No data to display  Initial Impression / Assessment and Plan / UC Course  I have reviewed the triage vital signs and the nursing notes.  Pertinent labs & imaging results that were available during my care of the patient were reviewed by me and considered in my medical decision making (see chart for details).    Significant concern for skin cancer of auricle of right ear along with multiple other areas of suspected squamous cell carcinoma.  Patient does state that he follows with the skin surgery center, I called this office and had an appointment arranged for this upcoming Monday at 3 PM.  We will treat with antibiotics in the meantime to cover any possible sources of infection.  Encouraged follow-up with any further concerns.  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      ED Prescriptions     Medication Sig Dispense Auth. Provider   doxycycline (VIBRAMYCIN) 100 MG capsule Take 1 capsule (100 mg total) by mouth 2 (two) times daily. 20 capsule Francene Finders, PA-C      PDMP not reviewed this encounter.   Francene Finders, PA-C 05/17/21 1559

## 2021-05-17 NOTE — ED Triage Notes (Signed)
Patient c/o right ear pain or discomfort, ear does have some drainage.  Also spots under left eye.

## 2021-09-05 ENCOUNTER — Ambulatory Visit
Admission: EM | Admit: 2021-09-05 | Discharge: 2021-09-05 | Disposition: A | Discharge status: Home / Self Care | Payer: Medicare PPO | Attending: Family Medicine | Admitting: Family Medicine

## 2021-09-05 ENCOUNTER — Encounter: Payer: Self-pay | Admitting: Emergency Medicine

## 2021-09-05 DIAGNOSIS — J441 Chronic obstructive pulmonary disease with (acute) exacerbation: Secondary | ICD-10-CM

## 2021-09-05 DIAGNOSIS — L989 Disorder of the skin and subcutaneous tissue, unspecified: Secondary | ICD-10-CM

## 2021-09-05 MED ORDER — ALBUTEROL SULFATE (2.5 MG/3ML) 0.083% IN NEBU
2.5000 mg | INHALATION_SOLUTION | Freq: Once | RESPIRATORY_TRACT | Status: AC
  Administered 2021-09-05: 2.5 mg via RESPIRATORY_TRACT

## 2021-09-05 MED ORDER — PREDNISONE 20 MG PO TABS: 40.0000 mg | ORAL_TABLET | Freq: Every day | ORAL | 0 refills | Status: DC

## 2021-09-05 MED ORDER — ALBUTEROL SULFATE HFA 108 (90 BASE) MCG/ACT IN AERS: 1.0000 | INHALATION_SPRAY | RESPIRATORY_TRACT | 0 refills | Status: DC | PRN

## 2021-09-05 NOTE — Discharge Instructions (Signed)
Your were given an albuterol breathing treatment today ? ?Use the albuterol inhaler--2 puffs every 4 hours as needed for shortness of breath/wheezing. ? ?Take prednisone 20 mg--2 tabs daily x 5 days ? ?Please make a dermatologist appt ? ? ? ?

## 2021-09-05 NOTE — ED Triage Notes (Signed)
Pt is present today with SOB, cough, and sinus pressure. Pt sx started yesterday  ?

## 2021-09-05 NOTE — ED Provider Notes (Signed)
?[facility] ? ? ? ?CSN: [id number] ?Arrival date & time: 09/05/21  1302 ? ? ?  ? ?History   ?Chief Complaint ?Chief Complaint  ?Patient presents with  ? Cough  ? Shortness of Breath  ? Facial Pain  ? ? ?HPI ?Don [name] is a 66 y.o. male.  ? ? ?Cough ?Associated symptoms: shortness of breath   ?Shortness of Breath ?Associated symptoms: cough   ?Here for cough and dyspnea/wheezing for 1 week. He has had some clear rhinorrhea. No fever/chills. ? ?This is the first time he has ever wheezed. He does smoke 1 or 1.5 ppd of cigarettes. ? ?He was seen here in Dec for skin lesions. He has not made a derm appt yet; has insurance. ? ?Past Medical History:  ?Diagnosis Date  ? Arthritis   ? Cancer [facility])   ? squamous cell "all in my face arms and hands"  ? Chronic pain   ? Depression   ? Headache(784.0)   ? Hypertension   ? Skin cancer   ? ? ?Patient Active Problem List  ? Diagnosis Date Noted  ? 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 Squamous cell skin cancer, face 09/27/2020  ? ? ?Past Surgical History:  ?Procedure Laterality Date  ? cartilage removed from both knees    ? KNEE ARTHROSCOPY    ? left wrist    ? SKIN CANCER EXCISION    ? ? ? ? ? ?Home Medications   ? ?Prior to Admission medications   ?Medication Sig Start Date End Date Taking? Authorizing Provider  ?albuterol (VENTOLIN HFA) 108 (90 Base) MCG/ACT inhaler Inhale 1-2 puffs into the lungs every 4 (four) hours as needed for wheezing or shortness of breath. 09/05/21  Yes Barrett Henle, MD  ?predniSONE (DELTASONE) 20 MG tablet Take 2 tablets (40 mg total) by mouth daily with breakfast for 5 days. 09/05/21 09/10/21 Yes Jackalyn Haith, Gwenlyn Perking, MD  ?losartan (COZAAR) 50 MG tablet Take 1 tablet by mouth daily. 09/06/20   [provider]  ?Multiple Vitamins-Minerals (MULTIVITAMIN WITH MINERALS) tablet Take 1 tablet by mouth daily.    [provider]  ? ? ?Family History ?Family History  ?Problem Relation Age of Onset  ? Cancer Father   ? Diabetes  Father   ? Hypertension Father   ? ? ?Social History ?Social History  ? ?Tobacco Use  ? Smoking status: Some Days  ?  Packs/day: 0.50  ?  Years: 20.00  ?  Pack years: 10.00  ?  Types: Cigarettes  ? Smokeless tobacco: Never  ?Substance Use Topics  ? Alcohol use: No  ? Drug use: No  ? ? ? ?Allergies   ?Patient has no known allergies. ? ? ?Review of Systems ?Review of Systems  ?Respiratory:  Positive for cough and shortness of breath.   ? ? ?Physical Exam ?Triage Vital Signs ?ED Triage Vitals  ?Enc Vitals Group  ?   BP 09/05/21 1336 (!) 160/95  ?   Pulse Rate 09/05/21 1336 61  ?   Resp 09/05/21 1336 19  ?   Temp 09/05/21 1336 97.9 ?F (36.6 ?C)  ?   Temp Source 09/05/21 1336 Oral  ?   SpO2 09/05/21 1336 92 %  ?   Weight --   ?   Height --   ?   Head Circumference --   ?   Peak Flow --   ?   Pain Score 09/05/21 1335 0  ?   Pain Loc --   ?  Pain Edu? --   ?   Excl. in [place]? --   ? ?No data found. ? ?Updated Vital Signs ?BP (!) 160/95   Pulse 61   Temp 97.9 ?F (36.6 ?C) (Oral)   Resp 19   SpO2 92%  ? ?Visual Acuity ?Right Eye Distance:   ?Left Eye Distance:   ?Bilateral Distance:   ? ?Right Eye Near:   ?Left Eye Near:    ?Bilateral Near:    ? ?Physical Exam ?Vitals reviewed.  ?Constitutional:   ?   General: He is not in acute distress. ?   Appearance: He is not toxic-appearing.  ?HENT:  ?   Right Ear: Tympanic membrane and ear canal normal.  ?   Left Ear: Tympanic membrane and ear canal normal.  ?   Ears:  ?   Comments: Both pinnae have ulcerated lesions with erythema on base; no purulent dc. Also lesions are behind both ext ears too ?   Nose: Nose normal.  ?   Mouth/Throat:  ?   Mouth: Mucous membranes are moist.  ?   Pharynx: No oropharyngeal exudate or posterior oropharyngeal erythema.  ?Eyes:  ?   Extraocular Movements: Extraocular movements intact.  ?   Conjunctiva/sclera: Conjunctivae normal.  ?   Pupils: Pupils are equal, round, and reactive to light.  ?Cardiovascular:  ?   Rate and Rhythm: Normal rate and  regular rhythm.  ?   Heart sounds: No murmur heard. ?Pulmonary:  ?   Effort: Pulmonary effort is normal.  ?   Breath sounds: Wheezing (before neb tx) present.  ?Musculoskeletal:  ?   Cervical back: Neck supple.  ?Lymphadenopathy:  ?   Cervical: No cervical adenopathy.  ?Skin: ?   Capillary Refill: Capillary refill takes less than 2 seconds.  ?   Coloration: Skin is not jaundiced or pale.  ?Neurological:  ?   General: No focal deficit present.  ?   Mental Status: He is alert and oriented to person, place, and time.  ?Psychiatric:     ?   Behavior: Behavior normal.  ? ? ? ?UC Treatments / Results  ?Labs ?(all labs ordered are listed, but only abnormal results are displayed) ?Labs Reviewed - No data to display ? ?EKG ? ? ?Radiology ?No results found. ? ?Procedures ?Procedures (including critical care time) ? ?Medications Ordered in UC ?Medications  ?albuterol (PROVENTIL) (2.5 MG/3ML) 0.083% nebulizer solution 2.5 mg (2.5 mg Nebulization Given 09/05/21 1355)  ? ? ?Initial Impression / Assessment and Plan / UC Course  ?I have reviewed the triage vital signs and the nursing notes. ? ?Pertinent labs & imaging results that were available during my care of the patient were reviewed by me and considered in my medical decision making (see chart for details). ? ?  ? ?Discussed that he most likely has developed copd; His lung exam was normal after the albuterol tx, and he felt much better. Request made to help him find a pcp ?Final Clinical Impressions(s) / UC Diagnoses  ? ?Final diagnoses:  ?COPD exacerbation ([place])  ?Skin lesion  ? ? ? ?Discharge Instructions   ? ?  ?Your were given an albuterol breathing treatment today ? ?Use the albuterol inhaler--2 puffs every 4 hours as needed for shortness of breath/wheezing. ? ?Take prednisone 20 mg--2 tabs daily x 5 days ? ?Please make a dermatologist appt ? ? ? ? ? ? ? ?ED Prescriptions   ? ? Medication Sig Dispense Auth. Provider  ? albuterol (VENTOLIN HFA) 108 (90  Base) MCG/ACT inhaler  Inhale 1-2 puffs into the lungs every 4 (four) hours as needed for wheezing or shortness of breath. 1 each Barrett Henle, MD  ? predniSONE (DELTASONE) 20 MG tablet Take 2 tablets (40 mg total) by mouth daily with breakfast for 5 days. 10 tablet Barrett Henle, MD  ? ?  ? ?PDMP not reviewed this encounter. ?  ?Barrett Henle, MD ?09/05/21 1414 ? ?

## 2021-09-07 ENCOUNTER — Telehealth: Payer: Self-pay

## 2021-09-07 MED ORDER — PREDNISONE 20 MG PO TABS: 40.0000 mg | ORAL_TABLET | Freq: Every day | ORAL | 0 refills | Status: AC

## 2021-09-07 MED ORDER — ALBUTEROL SULFATE HFA 108 (90 BASE) MCG/ACT IN AERS: 1.0000 | INHALATION_SPRAY | RESPIRATORY_TRACT | 0 refills | Status: DC | PRN

## 2021-09-07 NOTE — Telephone Encounter (Signed)
Pt called stating lost rx for albuterol and prednisone. Provider on site today approved resending rx to pharmacy.  ?

## 2021-09-10 ENCOUNTER — Emergency Department (HOSPITAL_BASED_OUTPATIENT_CLINIC_OR_DEPARTMENT_OTHER)
Admission: EM | Admit: 2021-09-10 | Discharge: 2021-09-10 | Disposition: A | Discharge status: Home / Self Care | Payer: Medicare PPO | Attending: Emergency Medicine | Admitting: Emergency Medicine

## 2021-09-10 ENCOUNTER — Encounter (HOSPITAL_BASED_OUTPATIENT_CLINIC_OR_DEPARTMENT_OTHER): Payer: Self-pay | Admitting: Emergency Medicine

## 2021-09-10 ENCOUNTER — Ambulatory Visit
Admission: EM | Admit: 2021-09-10 | Discharge: 2021-09-10 | Disposition: A | Discharge status: Home / Self Care | Payer: Medicare PPO

## 2021-09-10 ENCOUNTER — Emergency Department (HOSPITAL_BASED_OUTPATIENT_CLINIC_OR_DEPARTMENT_OTHER): Payer: Medicare PPO

## 2021-09-10 ENCOUNTER — Other Ambulatory Visit: Payer: Self-pay

## 2021-09-10 DIAGNOSIS — Z20822 Contact with and (suspected) exposure to covid-19: Secondary | ICD-10-CM | POA: Insufficient documentation

## 2021-09-10 DIAGNOSIS — J449 Chronic obstructive pulmonary disease, unspecified: Secondary | ICD-10-CM | POA: Diagnosis not present

## 2021-09-10 DIAGNOSIS — R062 Wheezing: Secondary | ICD-10-CM | POA: Insufficient documentation

## 2021-09-10 DIAGNOSIS — R0602 Shortness of breath: Secondary | ICD-10-CM | POA: Diagnosis present

## 2021-09-10 DIAGNOSIS — R5383 Other fatigue: Secondary | ICD-10-CM | POA: Diagnosis not present

## 2021-09-10 DIAGNOSIS — F17200 Nicotine dependence, unspecified, uncomplicated: Secondary | ICD-10-CM | POA: Diagnosis not present

## 2021-09-10 DIAGNOSIS — Z7952 Long term (current) use of systemic steroids: Secondary | ICD-10-CM | POA: Insufficient documentation

## 2021-09-10 LAB — RESP PANEL BY RT-PCR (FLU A&B, COVID) ARPGX2
Influenza A by PCR: NEGATIVE
Influenza B by PCR: NEGATIVE
SARS Coronavirus 2 by RT PCR: NEGATIVE

## 2021-09-10 MED ORDER — ALBUTEROL SULFATE HFA 108 (90 BASE) MCG/ACT IN AERS
2.0000 | INHALATION_SPRAY | Freq: Once | RESPIRATORY_TRACT | Status: AC
  Administered 2021-09-10: 2 via RESPIRATORY_TRACT
  Filled 2021-09-10: qty 6.7

## 2021-09-10 MED ORDER — CEFTRIAXONE SODIUM 500 MG IJ SOLR: 500.0000 mg | Freq: Once | INTRAMUSCULAR | Status: DC

## 2021-09-10 MED ORDER — DEXAMETHASONE SODIUM PHOSPHATE 10 MG/ML IJ SOLN
10.0000 mg | Freq: Once | INTRAMUSCULAR | Status: AC
  Administered 2021-09-10: 10 mg via INTRAVENOUS
  Filled 2021-09-10: qty 1

## 2021-09-10 MED ORDER — BENZONATATE 100 MG PO CAPS: 100.0000 mg | ORAL_CAPSULE | Freq: Three times a day (TID) | ORAL | 0 refills | Status: DC

## 2021-09-10 MED ORDER — IPRATROPIUM-ALBUTEROL 0.5-2.5 (3) MG/3ML IN SOLN
RESPIRATORY_TRACT | Status: AC
  Filled 2021-09-10: qty 3

## 2021-09-10 MED ORDER — IPRATROPIUM-ALBUTEROL 0.5-2.5 (3) MG/3ML IN SOLN
3.0000 mL | RESPIRATORY_TRACT | Status: DC | PRN
  Administered 2021-09-10: 3 mL via RESPIRATORY_TRACT

## 2021-09-10 NOTE — ED Triage Notes (Signed)
Pt arrives pov, c/o cough with shob and fatigue with body aches  x 4 days. Denies fever. Treated at [facility] on 4/5 for same. Pt is non compliant with b/p meds ?

## 2021-09-10 NOTE — Discharge Instructions (Signed)
Your lung exam had some diffuse wheezing that had improved significantly after a few breathing treatments here in the emergency department.  Given your long history of smoking, I suspect that you likely have an underlying COPD and I recommend that you establish care with a primary care doctor who can better manage this for you going forward.  I have sent you home with an inhaler that you can take as needed if you become short of breath.  However, I strongly recommend that you discontinue smoking as this will only make your symptoms worse. ?

## 2021-09-11 NOTE — ED Provider Notes (Signed)
?[facility] ?Provider Note ? ? ?CSN: [id number] ?Arrival date & time: 09/10/21  1709 ? ?  ? ?History ? ?Chief Complaint  ?Patient presents with  ? Fatigue  ? Cough  ? ? ?Don [name] is a 66 y.o. male who presents to the ED for evaluation of cough and shortness of breath and body aches for 4 days.  Patient was seen in urgent care 4 days ago for similar symptoms where he was given a 5-day course of prednisone the other.  Patient states that he felt slightly better when he was discharged after that time but symptoms returned after completing his steroids.  Per chart review of UC visit, patient has suspected underlying COPD not previously diagnosed given his long history of smoking, 1.5 packs/day.  Patient denies congestion, fevers, chills.  He has been noncompliant on his blood pressure medications.  He denies headache, vision changes, chest pain ? ? ?Cough ? ?  ? ?Home Medications ?Prior to Admission medications   ?Medication Sig Start Date End Date Taking? Authorizing Provider  ?benzonatate (TESSALON) 100 MG capsule Take 1 capsule (100 mg total) by mouth every 8 (eight) hours. 09/10/21  Yes Kathe Becton R, PA-C  ?albuterol (VENTOLIN HFA) 108 (90 Base) MCG/ACT inhaler Inhale 1-2 puffs into the lungs every 4 (four) hours as needed for wheezing or shortness of breath. 09/07/21   Hazel Sams, PA-C  ?losartan (COZAAR) 50 MG tablet Take 1 tablet by mouth daily. 09/06/20   [provider]  ?Multiple Vitamins-Minerals (MULTIVITAMIN WITH MINERALS) tablet Take 1 tablet by mouth daily.    [provider]  ?predniSONE (DELTASONE) 20 MG tablet Take 2 tablets (40 mg total) by mouth daily with breakfast for 5 days. 09/07/21 09/12/21  Hazel Sams, PA-C  ?   ? ?Allergies    ?Patient has no known allergies.   ? ?Review of Systems   ?Review of Systems  ?Respiratory:  Positive for cough.   ? ?Physical Exam ?Updated Vital Signs ?BP (!) 191/104 (BP Location: Left Arm)   Pulse 68    Temp 98.2 ?F (36.8 ?C) (Oral)   Resp 20   Ht '5\' 10"'$  (1.778 m)   Wt 86.2 kg   SpO2 96%   BMI 27.26 kg/m?  ?Physical Exam ?Vitals and nursing note reviewed.  ?Constitutional:   ?   General: He is not in acute distress. ?   Appearance: He is not ill-appearing.  ?HENT:  ?   Head: Atraumatic.  ?Eyes:  ?   Conjunctiva/sclera: Conjunctivae normal.  ?Cardiovascular:  ?   Rate and Rhythm: Normal rate and regular rhythm.  ?   Pulses: Normal pulses.  ?   Heart sounds: No murmur heard. ?Pulmonary:  ?   Effort: Pulmonary effort is normal. No respiratory distress.  ?   Breath sounds: Wheezing present.  ?   Comments: Diffuse expiratory wheezing in all fields ?Abdominal:  ?   General: Abdomen is flat. There is no distension.  ?   Palpations: Abdomen is soft.  ?   Tenderness: There is no abdominal tenderness.  ?Musculoskeletal:     ?   General: Normal range of motion.  ?   Cervical back: Normal range of motion.  ?Skin: ?   General: Skin is warm and dry.  ?   Capillary Refill: Capillary refill takes less than 2 seconds.  ?Neurological:  ?   General: No focal deficit present.  ?   Mental Status: He is alert.  ?Psychiatric:     ?  Mood and Affect: Mood normal.  ? ? ?ED Results / Procedures / Treatments   ?Labs ?(all labs ordered are listed, but only abnormal results are displayed) ?Labs Reviewed  ?RESP PANEL BY RT-PCR (FLU A&B, COVID) [id number]  ? ? ?EKG ?None ? ?Radiology ?DG Chest 2 View ? ?Result Date: 09/10/2021 ?CLINICAL DATA:  Cough EXAM: CHEST - 2 VIEW COMPARISON:  07/19/2011 FINDINGS: The heart size and mediastinal contours are within normal limits. Both lungs are clear. The visualized skeletal structures are unremarkable. IMPRESSION: No active cardiopulmonary disease. Electronically Signed   By: Donavan Foil M.D.   On: 09/10/2021 19:44   ? ?Procedures ?Procedures  ? ? ?Medications Ordered in ED ?Medications  ?dexamethasone (DECADRON) injection 10 mg (10 mg Intravenous Given 09/10/21 2051)  ?albuterol (VENTOLIN HFA) 108 (90  Base) MCG/ACT inhaler 2 puff (2 puffs Inhalation Given 09/10/21 2051)  ? ? ?ED Course/ Medical Decision Making/ A&P ?  ?                        ?Medical Decision Making ?Amount and/or Complexity of Data Reviewed ?Radiology: ordered. ? ?Risk ?Prescription drug management. ? ? ?History:  ?Per HPI ?Social determinants of health: noncompliant on BP meds, advised to call PCP ? ?Initial impression: ? ?This patient presents to the ED for concern of cough and shortness of breath, this involves an extensive number of treatment options, and is a complaint that carries with it a high risk of complications and morbidity.    ? ? ?Lab Tests and EKG: ? ?I Ordered, reviewed, and interpreted labs and EKG.  The pertinent results include:  ?Negative, COVID, flu ? ? ?Imaging Studies ordered: ? ?I ordered imaging studies including  ?Chest x-ray without acute findings, specifically no infiltrate consistent with pneumonia ?I independently visualized and interpreted imaging and I agree with the radiologist interpretation.  ? ? ?Medicines ordered and prescription drug management: ? ?I ordered medication including: ?DuoNeb breathing treatment ?Decadron 10 mg ?Reevaluation of the patient after these medicines showed that the patient improved ?I have reviewed the patients home medicines and have made adjustments as needed ? ?ED Course: ?66 year old male presents to the ED for evaluation of cough and shortness of breath that has been ongoing for several days.  Per chart review, suspect underlying undiagnosed COPD.  At time of evaluation, patient had already received 1 breathing treatment and he reports that he feels "100% better".  After his breathing treatment, he continues to have diffuse expiratory wheezing although his oxygen sats have been above 96% on room air since arrival.  Chest x-ray normal.  Given that he only had a short course of p.o. oral steroids, he was given a dose of Decadron here in the emergency department.  I have also sent  him home with albuterol inhaler that he can use as needed.  Also discharged home with Tessalon Perles to help with his cough.  I told patient that if he does have underlying COPD, that the interventions we are doing are only a Band-Aid.  He really needs to see his primary care doctor not only to have this managed but also his uncontrolled blood pressure.  Blood pressure was significantly elevated here in the emergency department, but he is asymptomatic and does not require additional intervention.  Advised him to follow-up with his primary care doctor. ? ?Disposition: ? ?After consideration of the diagnostic results, physical exam, history and the patients response to treatment feel that the patent would benefit  from discharge with strict return precautions.   ?Wheezing: Plan and management as described above.  Discharged home in good condition. ? ?Final Clinical Impression(s) / ED Diagnoses ?Final diagnoses:  ?Wheezing  ? ? ?Rx / DC Orders ?ED Discharge Orders   ? ?      Ordered  ?  benzonatate (TESSALON) 100 MG capsule  Every 8 hours       ? 09/10/21 2041  ? ?  ?  ? ?  ? ? ?  ?Tonye Pearson, [place] ?09/11/21 2339 ? ?  ?Gareth Morgan, MD ?09/13/21 1723 ? ?

## 2021-09-17 ENCOUNTER — Ambulatory Visit: Payer: Medicare PPO | Admitting: Registered Nurse

## 2021-10-03 ENCOUNTER — Ambulatory Visit (INDEPENDENT_AMBULATORY_CARE_PROVIDER_SITE_OTHER): Payer: Medicare PPO | Admitting: Registered Nurse

## 2021-10-03 ENCOUNTER — Encounter: Payer: Self-pay | Admitting: Registered Nurse

## 2021-10-03 VITALS — BP 170/102 | HR 66 | Temp 98.0°F | Resp 18 | Ht 70.0 in | Wt 199.0 lb

## 2021-10-03 DIAGNOSIS — I10 Essential (primary) hypertension: Secondary | ICD-10-CM

## 2021-10-03 DIAGNOSIS — R351 Nocturia: Secondary | ICD-10-CM | POA: Diagnosis not present

## 2021-10-03 DIAGNOSIS — C44320 Squamous cell carcinoma of skin of unspecified parts of face: Secondary | ICD-10-CM | POA: Diagnosis not present

## 2021-10-03 DIAGNOSIS — Z8639 Personal history of other endocrine, nutritional and metabolic disease: Secondary | ICD-10-CM | POA: Diagnosis not present

## 2021-10-03 DIAGNOSIS — F17210 Nicotine dependence, cigarettes, uncomplicated: Secondary | ICD-10-CM

## 2021-10-03 DIAGNOSIS — J22 Unspecified acute lower respiratory infection: Secondary | ICD-10-CM

## 2021-10-03 DIAGNOSIS — Z1211 Encounter for screening for malignant neoplasm of colon: Secondary | ICD-10-CM

## 2021-10-03 LAB — LIPID PANEL
Cholesterol: 145 mg/dL (ref 0–200)
HDL: 38.3 mg/dL — ABNORMAL LOW (ref >39.00)
LDL Cholesterol: 71 mg/dL (ref 0–99)
NonHDL: 106.41
Total CHOL/HDL Ratio: 4
Triglycerides: 177 mg/dL — ABNORMAL HIGH (ref 0.0–149.0)
VLDL: 35.4 mg/dL (ref 0.0–40.0)

## 2021-10-03 LAB — COMPREHENSIVE METABOLIC PANEL
ALT: 21 U/L (ref 0–53)
AST: 28 U/L (ref 0–37)
Albumin: 3.9 g/dL (ref 3.5–5.2)
Alkaline Phosphatase: 84 U/L (ref 39–117)
BUN: 5 mg/dL — ABNORMAL LOW (ref 6–23)
CO2: 32 mEq/L (ref 19–32)
Calcium: 9.4 mg/dL (ref 8.4–10.5)
Chloride: 101 mEq/L (ref 96–112)
Creatinine, Ser: 0.89 mg/dL (ref 0.40–1.50)
GFR: 88.48 mL/min (ref >60.00)
Glucose, Bld: 95 mg/dL (ref 70–99)
Potassium: 3.9 mEq/L (ref 3.5–5.1)
Sodium: 138 mEq/L (ref 135–145)
Total Bilirubin: 0.4 mg/dL (ref 0.2–1.2)
Total Protein: 6.7 g/dL (ref 6.0–8.3)

## 2021-10-03 LAB — CBC WITH DIFFERENTIAL/PLATELET
Basophils Absolute: 0.1 10*3/uL (ref 0.0–0.1)
Basophils Relative: 0.8 % (ref 0.0–3.0)
Eosinophils Absolute: 0.4 10*3/uL (ref 0.0–0.7)
Eosinophils Relative: 6.2 % — ABNORMAL HIGH (ref 0.0–5.0)
HCT: 42.3 % (ref 39.0–52.0)
Hemoglobin: 14.2 g/dL (ref 13.0–17.0)
Lymphocytes Relative: 21.9 % (ref 12.0–46.0)
Lymphs Abs: 1.4 10*3/uL (ref 0.7–4.0)
MCHC: 33.6 g/dL (ref 30.0–36.0)
MCV: 93.9 fl (ref 78.0–100.0)
Monocytes Absolute: 0.5 10*3/uL (ref 0.1–1.0)
Monocytes Relative: 7.5 % (ref 3.0–12.0)
Neutro Abs: 4 10*3/uL (ref 1.4–7.7)
Neutrophils Relative %: 63.6 % (ref 43.0–77.0)
Platelets: 212 10*3/uL (ref 150.0–400.0)
RBC: 4.5 Mil/uL (ref 4.22–5.81)
RDW: 13.6 % (ref 11.5–15.5)
WBC: 6.3 10*3/uL (ref 4.0–10.5)

## 2021-10-03 LAB — URINALYSIS, ROUTINE W REFLEX MICROSCOPIC
Bilirubin Urine: NEGATIVE
Hgb urine dipstick: NEGATIVE
Ketones, ur: NEGATIVE
Leukocytes,Ua: NEGATIVE
Nitrite: NEGATIVE
RBC / HPF: NONE SEEN (ref 0–?)
Specific Gravity, Urine: 1.025 (ref 1.000–1.030)
Total Protein, Urine: NEGATIVE
Urine Glucose: NEGATIVE
Urobilinogen, UA: 0.2 (ref 0.0–1.0)
pH: 6 (ref 5.0–8.0)

## 2021-10-03 LAB — HEMOGLOBIN A1C: Hgb A1c MFr Bld: 6.1 % (ref 4.6–6.5)

## 2021-10-03 LAB — PSA, MEDICARE: PSA: 0.68 ng/ml (ref 0.10–4.00)

## 2021-10-03 LAB — TSH: TSH: 2.43 u[IU]/mL (ref 0.35–5.50)

## 2021-10-03 MED ORDER — LOSARTAN POTASSIUM-HCTZ 100-12.5 MG PO TABS: 1.0000 | ORAL_TABLET | Freq: Every day | ORAL | 1 refills | Status: DC

## 2021-10-03 MED ORDER — DOXYCYCLINE HYCLATE 100 MG PO TABS: 100.0000 mg | ORAL_TABLET | Freq: Two times a day (BID) | ORAL | 0 refills | Status: DC

## 2021-10-03 MED ORDER — TAMSULOSIN HCL 0.4 MG PO CAPS: 0.4000 mg | ORAL_CAPSULE | Freq: Every day | ORAL | 3 refills | Status: DC

## 2021-10-03 MED ORDER — PREDNISONE 10 MG (21) PO TBPK: ORAL_TABLET | ORAL | 0 refills | Status: DC

## 2021-10-03 NOTE — Assessment & Plan Note (Signed)
Check PSA per shared decision making with patient. Start flomax 0.4 mg po qd. Monitor effect, recheck in 6 weeks.  ?

## 2021-10-03 NOTE — Assessment & Plan Note (Signed)
Refer to lung CA screen for CT scan.  ?

## 2021-10-03 NOTE — Assessment & Plan Note (Signed)
Labs collected. Will follow up with the patient as warranted.  

## 2021-10-03 NOTE — Patient Instructions (Addendum)
Mr. Caudill -  ? ?Great to meet you! ? ?I think the most important thing right now is to get you back in the care of oncology and dermatology. Your skin cancer is the biggest threat to your short and long term health. ? ?For the skin and respiratory infections (including ear infection), I have sent doxycycline and prednisone. Take the complete course of each of these even if you feel better. Continue to use albuterol as needed. ? ?For the nighttime urination, I have sent flomax to take once daily in the mornings. ? ?For blood pressure, I have sent losartan-hctz to take 1 tab each day in the mornings.  ? ?I have sent referrals to [facility] and Gastroenterology for Colon Cancer Screening - these groups will call you to schedule. ? ?Come back in to see me in about 6 weeks to recheck on conditions and discuss planning for future visits. ? ?Thank you, ? ?Rich  ? ? ? ?If you have lab work done today you will be contacted with your lab results within the next 2 weeks.  If you have not heard from [place] then please contact us. The fastest way to get your results is to register for My Chart. ? ? ?IF you received an x-ray today, you will receive an invoice from [facility]. Please contact [facility] at [phone] with questions or concerns regarding your invoice.  ? ?IF you received labwork today, you will receive an invoice from [facility]. Please contact [facility] at [phone] with questions or concerns regarding your invoice.  ? ?Our billing staff will not be able to assist you with questions regarding bills from these companies. ? ?You will be contacted with the lab results as soon as they are available. The fastest way to get your results is to activate your My Chart account. Instructions are located on the last page of this paperwork. If you have not heard from [place] regarding the results in 2 weeks, please contact this office. ?  ? ? ?

## 2021-10-03 NOTE — Progress Notes (Signed)
? ?New Patient Office Visit ? ?Subjective:  ?Patient ID: Don [name], male    DOB: 10/12/1955  Age: 66 y.o. MRN: [id number] ? ?CC:  ?Chief Complaint  ?Patient presents with  ? New Patient (Initial Visit)  ?  Patient states he is here to establish care, BP medication refill and congestion.  ? ? ?HPI ?Don [name] presents to establish care ?Histories reviewed and updated with patient.  ? ?SCC ?Long hx of sun exposure. Extensive SCC through face, scalp, and upper extremities ?Treated with Dr. Isidore Moos with radiation. He is overdue for follow up but he states he has upcoming appt. ?He does have an area behind his L ear that is becoming ulcerated. Concern for secondary infection.  ? ?Hypertension: ?Patient Currently taking: losartan '50mg'$  po qd ?incomplete effect. No AEs. ?Denies CV symptoms including: chest pain, shob, doe, headache, visual changes, fatigue, claudication, and dependent edema.  ? ?Previous readings and labs: ?BP Readings from Last 3 Encounters:  ?10/03/21 (!) 170/102  ?09/10/21 (!) 191/104  ?09/05/21 (!) 160/95  ? ?Lab Results  ?Component Value Date  ? CREATININE 0.78 12/17/2013  ? ? ?Congestion ?Coughing, productive with yellow mucus ?No shob, doe, chest pain, dizziness.  ?Long smoking history. Does not seem to be formal dx of COPD in his chart ? ?Nocturia ?Frequent, 4-5 times per night ?Post void dribbling ?No hematuria or dysuria ? ?HM ?Due for colon can screen - will refer to GI ?Due for lung ca screen - will refer to lung ca screen ?Outpatient Encounter Medications as of 10/03/2021  ?Medication Sig  ? albuterol (VENTOLIN HFA) 108 (90 Base) MCG/ACT inhaler Inhale 1-2 puffs into the lungs every 4 (four) hours as needed for wheezing or shortness of breath.  ? doxycycline (VIBRA-TABS) 100 MG tablet Take 1 tablet (100 mg total) by mouth 2 (two) times daily.  ? losartan-hydrochlorothiazide (HYZAAR) 100-12.5 MG tablet Take 1 tablet by mouth daily.  ? Multiple Vitamins-Minerals (MULTIVITAMIN WITH  MINERALS) tablet Take 1 tablet by mouth daily.  ? predniSONE (STERAPRED UNI-PAK 21 TAB) 10 MG (21) TBPK tablet Take per package instructions. Do not skip doses. Finish entire supply.  ? tamsulosin (FLOMAX) 0.4 MG CAPS capsule Take 1 capsule (0.4 mg total) by mouth daily.  ? [DISCONTINUED] benzonatate (TESSALON) 100 MG capsule Take 1 capsule (100 mg total) by mouth every 8 (eight) hours.  ? [DISCONTINUED] losartan (COZAAR) 50 MG tablet Take 1 tablet by mouth daily.  ? ?No facility-administered encounter medications on file as of 10/03/2021.  ? ? ?Past Medical History:  ?Diagnosis Date  ? Arthritis   ? Cancer [facility])   ? squamous cell "all in my face arms and hands"  ? Chronic pain   ? Depression   ? Headache(784.0)   ? Hypertension   ? Skin cancer   ? ? ?Past Surgical History:  ?Procedure Laterality Date  ? cartilage removed from both knees    ? KNEE ARTHROSCOPY    ? left wrist    ? SKIN CANCER EXCISION    ? ? ?Family History  ?Problem Relation Age of Onset  ? Cancer Father   ? Diabetes Father   ? Hypertension Father   ? ? ?Social History  ? ?Socioeconomic History  ? Marital status: Single  ?  Spouse name: Not on file  ? Number of children: Not on file  ? Years of education: Not on file  ? Highest education level: Not on file  ?Occupational History  ? Not on file  ?  Tobacco Use  ? Smoking status: Every Day  ?  Packs/day: 1.00  ?  Years: 30.00  ?  Pack years: 30.00  ?  Types: Cigarettes  ? Smokeless tobacco: Never  ?Vaping Use  ? Vaping Use: Never used  ?Substance and Sexual Activity  ? Alcohol use: No  ? Drug use: No  ? Sexual activity: Not on file  ?Other Topics Concern  ? Not on file  ?Social History Narrative  ? Not on file  ? ?Social Determinants of Health  ? ?Financial Resource Strain: Not on file  ?Food Insecurity: Not on file  ?Transportation Needs: Not on file  ?Physical Activity: Not on file  ?Stress: Not on file  ?Social Connections: Not on file  ?Intimate Partner Violence: Not on file  ? ? ?ROS ?Review of  Systems ? ?Objective:  ? ?Today's Vitals: BP (!) 170/102   Pulse 66   Temp 98 ?F (36.7 ?C) (Temporal)   Resp 18   Ht '5\' 10"'$  (1.778 m)   Wt 199 lb (90.3 kg)   SpO2 98%   BMI 28.55 kg/m?  ? ?Physical Exam ?Constitutional:   ?   General: He is not in acute distress. ?   Appearance: Normal appearance. He is normal weight. He is not ill-appearing, toxic-appearing or diaphoretic.  ?Cardiovascular:  ?   Rate and Rhythm: Normal rate and regular rhythm.  ?   Heart sounds: Normal heart sounds. No murmur heard. ?  No friction rub. No gallop.  ?Pulmonary:  ?   Effort: Pulmonary effort is normal. No respiratory distress.  ?   Breath sounds: Normal breath sounds. No stridor. No wheezing, rhonchi or rales.  ?Chest:  ?   Chest wall: No tenderness.  ?Skin: ?   Findings: Lesion (multiple lesions appearing as widespread SCC on face and neck) present.  ?Neurological:  ?   General: No focal deficit present.  ?   Mental Status: He is alert and oriented to person, place, and time. Mental status is at baseline.  ?Psychiatric:     ?   Mood and Affect: Mood normal.     ?   Behavior: Behavior normal.     ?   Thought Content: Thought content normal.     ?   Judgment: Judgment normal.  ? ? ? ? ? ? ?Assessment & Plan:  ? ?Problem List Items Addressed This Visit   ? ?  ? Cardiovascular and Mediastinum  ? Primary hypertension - Primary  ?  Uncontrolled. Increase therapy to losartan-hctz 100-12.'5mg'$  po qd. Monitor effect. Recheck in 6 weeks. ? ?  ?  ? Relevant Medications  ? losartan-hydrochlorothiazide (HYZAAR) 100-12.5 MG tablet  ? Other Relevant Orders  ? CBC with Differential/Platelet  ? Comprehensive metabolic panel  ? Lipid panel  ? Urinalysis, Routine w reflex microscopic  ?  ? Musculoskeletal and Integument  ? Squamous cell skin cancer, face  ?  Have encouraged pt to visit with Onc as he is months overdue for this. He states he has an upcoming appointment and will call to confirm.  ? ?  ?  ? Relevant Medications  ? doxycycline  (VIBRA-TABS) 100 MG tablet  ? predniSONE (STERAPRED UNI-PAK 21 TAB) 10 MG (21) TBPK tablet  ? Other Relevant Orders  ? CBC with Differential/Platelet  ? Hemoglobin A1c  ? Comprehensive metabolic panel  ? Lipid panel  ? TSH  ? Urinalysis, Routine w reflex microscopic  ?  ? Other  ? History of elevated glucose  ?  Labs collected. Will follow up with the patient as warranted. ? ? ?  ?  ? Relevant Orders  ? Hemoglobin A1c  ? Nocturia  ?  Check PSA per shared decision making with patient. Start flomax 0.4 mg po qd. Monitor effect, recheck in 6 weeks.  ? ?  ?  ? Relevant Medications  ? tamsulosin (FLOMAX) 0.4 MG CAPS capsule  ? Other Relevant Orders  ? PSA, Medicare ( [place] only)  ? Smokes less than 1 pack a day with greater than 30 pack year history  ?  Refer to lung CA screen for CT scan.  ? ?  ?  ? Relevant Orders  ? Ambulatory Referral Lung Cancer Screening [facility]  ? ?Other Visit Diagnoses   ? ? Lower respiratory infection      ? Relevant Medications  ? doxycycline (VIBRA-TABS) 100 MG tablet  ? predniSONE (STERAPRED UNI-PAK 21 TAB) 10 MG (21) TBPK tablet  ? Screen for colon cancer      ? Relevant Orders  ? Ambulatory referral to Gastroenterology  ? ?  ? ? ?Follow-up: Return in about 6 weeks (around 11/14/2021) for Chronic Conditions.  ? ?PLAN ?Doxycycline and prednisone for lower respiratory infection. Continue albuterol.  ?Refer to GI for colonoscopy. ?Maximiano Coss, NP ?

## 2021-10-03 NOTE — Assessment & Plan Note (Signed)
Have encouraged pt to visit with Onc as he is months overdue for this. He states he has an upcoming appointment and will call to confirm.  ?

## 2021-10-03 NOTE — Assessment & Plan Note (Signed)
Uncontrolled. Increase therapy to losartan-hctz 100-12.'5mg'$  po qd. Monitor effect. Recheck in 6 weeks. ?

## 2021-10-04 ENCOUNTER — Encounter: Payer: Self-pay | Admitting: Registered Nurse

## 2021-11-26 ENCOUNTER — Ambulatory Visit: Payer: Medicare PPO | Admitting: Registered Nurse

## 2021-12-19 ENCOUNTER — Other Ambulatory Visit: Payer: Self-pay

## 2021-12-19 ENCOUNTER — Telehealth: Payer: Self-pay | Admitting: Registered Nurse

## 2021-12-19 DIAGNOSIS — R351 Nocturia: Secondary | ICD-10-CM

## 2021-12-19 MED ORDER — TAMSULOSIN HCL 0.4 MG PO CAPS: 0.4000 mg | ORAL_CAPSULE | Freq: Every day | ORAL | 3 refills | Status: DC

## 2021-12-19 NOTE — Telephone Encounter (Signed)
Rx refill sent to pharmacy. 

## 2021-12-19 NOTE — Telephone Encounter (Signed)
Encourage patient to contact the pharmacy for refills or they can request refills through [facility]  (Please schedule appointment if patient has not been seen in over a year)    WHAT PHARMACY WOULD THEY LIKE THIS SENT TO: [facility] [phone]  MEDICATION NAME & DOSE: tamulosin 0.4 mg caps   NOTES/COMMENTS FROM PATIENT:      [place] office please notify patient: It takes 48-72 hours to process rx refill requests Ask patient to call pharmacy to ensure rx is ready before heading there.

## 2021-12-24 NOTE — Telephone Encounter (Signed)
Patient wants a call back from [facility] regarding medications

## 2021-12-26 ENCOUNTER — Telehealth: Payer: Self-pay | Admitting: Registered Nurse

## 2021-12-26 ENCOUNTER — Other Ambulatory Visit: Payer: Self-pay

## 2021-12-26 DIAGNOSIS — R351 Nocturia: Secondary | ICD-10-CM

## 2021-12-26 MED ORDER — TAMSULOSIN HCL 0.4 MG PO CAPS: 0.4000 mg | ORAL_CAPSULE | Freq: Every day | ORAL | 3 refills | Status: DC

## 2021-12-26 NOTE — Telephone Encounter (Signed)
Refill sent in for  a second time

## 2021-12-26 NOTE — Telephone Encounter (Signed)
Encourage patient to contact the pharmacy for refills or they can request refills through [facility]  (Please schedule appointment if patient has not been seen in over a year)    WHAT PHARMACY WOULD THEY LIKE THIS SENT TO: [facility] [phone]  MEDICATION NAME & DOSE: Flomax 0.4 mg capsules  NOTES/COMMENTS FROM PATIENT: Pt misplaced rx and needs another one called in      [place] office please notify patient: It takes 48-72 hours to process rx refill requests Ask patient to call pharmacy to ensure rx is ready before heading there.

## 2021-12-27 NOTE — Telephone Encounter (Signed)
Attempted to call patient back about his concerns with medication. LVM to return call

## 2022-06-09 ENCOUNTER — Emergency Department (HOSPITAL_COMMUNITY): Payer: Medicare PPO

## 2022-06-09 ENCOUNTER — Other Ambulatory Visit: Payer: Self-pay

## 2022-06-09 ENCOUNTER — Observation Stay (HOSPITAL_COMMUNITY)
Admission: EM | Admit: 2022-06-09 | Discharge: 2022-06-11 | Disposition: A | Discharge status: Home / Self Care | Payer: Medicare PPO | Attending: Family Medicine | Admitting: Family Medicine

## 2022-06-09 ENCOUNTER — Encounter (HOSPITAL_COMMUNITY): Payer: Self-pay

## 2022-06-09 DIAGNOSIS — R93429 Abnormal radiologic findings on diagnostic imaging of unspecified kidney: Secondary | ICD-10-CM | POA: Diagnosis present

## 2022-06-09 DIAGNOSIS — Z79899 Other long term (current) drug therapy: Secondary | ICD-10-CM | POA: Diagnosis not present

## 2022-06-09 DIAGNOSIS — R4182 Altered mental status, unspecified: Secondary | ICD-10-CM | POA: Insufficient documentation

## 2022-06-09 DIAGNOSIS — F17210 Nicotine dependence, cigarettes, uncomplicated: Secondary | ICD-10-CM | POA: Insufficient documentation

## 2022-06-09 DIAGNOSIS — I10 Essential (primary) hypertension: Secondary | ICD-10-CM | POA: Insufficient documentation

## 2022-06-09 DIAGNOSIS — Z85828 Personal history of other malignant neoplasm of skin: Secondary | ICD-10-CM | POA: Diagnosis not present

## 2022-06-09 DIAGNOSIS — R1110 Vomiting, unspecified: Secondary | ICD-10-CM | POA: Diagnosis not present

## 2022-06-09 DIAGNOSIS — I160 Hypertensive urgency: Principal | ICD-10-CM | POA: Insufficient documentation

## 2022-06-09 DIAGNOSIS — R109 Unspecified abdominal pain: Secondary | ICD-10-CM | POA: Diagnosis present

## 2022-06-09 DIAGNOSIS — I700 Atherosclerosis of aorta: Secondary | ICD-10-CM | POA: Insufficient documentation

## 2022-06-09 DIAGNOSIS — F1990 Other psychoactive substance use, unspecified, uncomplicated: Secondary | ICD-10-CM | POA: Diagnosis present

## 2022-06-09 DIAGNOSIS — D72829 Elevated white blood cell count, unspecified: Secondary | ICD-10-CM | POA: Insufficient documentation

## 2022-06-09 DIAGNOSIS — E871 Hypo-osmolality and hyponatremia: Secondary | ICD-10-CM | POA: Diagnosis not present

## 2022-06-09 DIAGNOSIS — R1114 Bilious vomiting: Secondary | ICD-10-CM

## 2022-06-09 DIAGNOSIS — I169 Hypertensive crisis, unspecified: Secondary | ICD-10-CM | POA: Diagnosis not present

## 2022-06-09 DIAGNOSIS — N280 Ischemia and infarction of kidney: Secondary | ICD-10-CM | POA: Insufficient documentation

## 2022-06-09 DIAGNOSIS — R112 Nausea with vomiting, unspecified: Secondary | ICD-10-CM | POA: Diagnosis present

## 2022-06-09 DIAGNOSIS — R351 Nocturia: Secondary | ICD-10-CM

## 2022-06-09 LAB — URINALYSIS, ROUTINE W REFLEX MICROSCOPIC
Bacteria, UA: NONE SEEN
Bilirubin Urine: NEGATIVE
Glucose, UA: NEGATIVE mg/dL
Ketones, ur: 5 mg/dL — AB
Leukocytes,Ua: NEGATIVE
Nitrite: NEGATIVE
Protein, ur: 30 mg/dL — AB
Specific Gravity, Urine: 1.02 (ref 1.005–1.030)
pH: 5 (ref 5.0–8.0)

## 2022-06-09 LAB — CBC WITH DIFFERENTIAL/PLATELET
Abs Immature Granulocytes: 0.08 10*3/uL — ABNORMAL HIGH (ref 0.00–0.07)
Basophils Absolute: 0.1 10*3/uL (ref 0.0–0.1)
Basophils Relative: 0 %
Eosinophils Absolute: 0 10*3/uL (ref 0.0–0.5)
Eosinophils Relative: 0 %
HCT: 51.4 % (ref 39.0–52.0)
Hemoglobin: 17.3 g/dL — ABNORMAL HIGH (ref 13.0–17.0)
Immature Granulocytes: 1 %
Lymphocytes Relative: 10 %
Lymphs Abs: 1.2 10*3/uL (ref 0.7–4.0)
MCH: 31.6 pg (ref 26.0–34.0)
MCHC: 33.7 g/dL (ref 30.0–36.0)
MCV: 94 fL (ref 80.0–100.0)
Monocytes Absolute: 0.4 10*3/uL (ref 0.1–1.0)
Monocytes Relative: 4 %
Neutro Abs: 10.1 10*3/uL — ABNORMAL HIGH (ref 1.7–7.7)
Neutrophils Relative %: 85 %
Platelets: 197 10*3/uL (ref 150–400)
RBC: 5.47 MIL/uL (ref 4.22–5.81)
RDW: 12.7 % (ref 11.5–15.5)
WBC: 11.8 10*3/uL — ABNORMAL HIGH (ref 4.0–10.5)
nRBC: 0 % (ref 0.0–0.2)

## 2022-06-09 LAB — COMPREHENSIVE METABOLIC PANEL
ALT: 65 U/L — ABNORMAL HIGH (ref 0–44)
AST: 100 U/L — ABNORMAL HIGH (ref 15–41)
Albumin: 4.7 g/dL (ref 3.5–5.0)
Alkaline Phosphatase: 68 U/L (ref 38–126)
Anion gap: 11 (ref 5–15)
BUN: 16 mg/dL (ref 8–23)
CO2: 24 mmol/L (ref 22–32)
Calcium: 9.9 mg/dL (ref 8.9–10.3)
Chloride: 99 mmol/L (ref 98–111)
Creatinine, Ser: 1.19 mg/dL (ref 0.61–1.24)
GFR, Estimated: >60 mL/min (ref >60)
Glucose, Bld: 137 mg/dL — ABNORMAL HIGH (ref 70–99)
Potassium: 4 mmol/L (ref 3.5–5.1)
Sodium: 134 mmol/L — ABNORMAL LOW (ref 135–145)
Total Bilirubin: 1 mg/dL (ref 0.3–1.2)
Total Protein: 8.5 g/dL — ABNORMAL HIGH (ref 6.5–8.1)

## 2022-06-09 LAB — LIPASE, BLOOD: Lipase: 25 U/L (ref 11–51)

## 2022-06-09 MED ORDER — LORAZEPAM 2 MG/ML IJ SOLN
1.0000 mg | Freq: Once | INTRAMUSCULAR | Status: AC
  Administered 2022-06-09: 1 mg via INTRAVENOUS
  Filled 2022-06-09: qty 1

## 2022-06-09 MED ORDER — DILTIAZEM HCL ER COATED BEADS 120 MG PO CP24
120.0000 mg | ORAL_CAPSULE | Freq: Once | ORAL | Status: AC
  Administered 2022-06-09: 120 mg via ORAL
  Filled 2022-06-09: qty 1

## 2022-06-09 MED ORDER — IOHEXOL 350 MG/ML SOLN
100.0000 mL | Freq: Once | INTRAVENOUS | Status: AC | PRN
  Administered 2022-06-09: 100 mL via INTRAVENOUS

## 2022-06-09 MED ORDER — METOCLOPRAMIDE HCL 5 MG/ML IJ SOLN
10.0000 mg | Freq: Once | INTRAMUSCULAR | Status: AC
  Administered 2022-06-09: 10 mg via INTRAVENOUS
  Filled 2022-06-09: qty 2

## 2022-06-09 MED ORDER — LABETALOL HCL 5 MG/ML IV SOLN
20.0000 mg | INTRAVENOUS | Status: DC | PRN
  Administered 2022-06-09 (×2): 20 mg via INTRAVENOUS
  Filled 2022-06-09 (×2): qty 4

## 2022-06-09 MED ORDER — ONDANSETRON 4 MG PO TBDP
4.0000 mg | ORAL_TABLET | Freq: Once | ORAL | Status: AC
  Administered 2022-06-09: 4 mg via ORAL
  Filled 2022-06-09: qty 1

## 2022-06-09 MED ORDER — NICARDIPINE HCL IN NACL 20-0.86 MG/200ML-% IV SOLN
3.0000 mg/h | INTRAVENOUS | Status: DC
  Administered 2022-06-09: 5 mg/h via INTRAVENOUS
  Filled 2022-06-09: qty 200

## 2022-06-09 MED ORDER — HYDROCHLOROTHIAZIDE 12.5 MG PO TABS
12.5000 mg | ORAL_TABLET | Freq: Every day | ORAL | Status: DC
  Administered 2022-06-09 – 2022-06-11 (×3): 12.5 mg via ORAL
  Filled 2022-06-09 (×3): qty 1

## 2022-06-09 MED ORDER — LOSARTAN POTASSIUM 50 MG PO TABS
100.0000 mg | ORAL_TABLET | Freq: Every day | ORAL | Status: DC
  Administered 2022-06-09 – 2022-06-11 (×3): 100 mg via ORAL
  Filled 2022-06-09 (×2): qty 4
  Filled 2022-06-09: qty 2

## 2022-06-09 MED ORDER — NICARDIPINE HCL 20 MG PO CAPS
20.0000 mg | ORAL_CAPSULE | Freq: Three times a day (TID) | ORAL | Status: DC
  Filled 2022-06-09 (×2): qty 1

## 2022-06-09 MED ORDER — LOSARTAN POTASSIUM-HCTZ 100-12.5 MG PO TABS: 1.0000 | ORAL_TABLET | Freq: Every day | ORAL | Status: DC

## 2022-06-09 MED ORDER — FENTANYL CITRATE PF 50 MCG/ML IJ SOSY
25.0000 ug | PREFILLED_SYRINGE | Freq: Once | INTRAMUSCULAR | Status: AC
  Administered 2022-06-09: 25 ug via INTRAVENOUS
  Filled 2022-06-09: qty 1

## 2022-06-09 NOTE — ED Provider Triage Note (Signed)
Emergency Medicine Provider Triage Evaluation Note  Don [name] , a 67 y.o. male  was evaluated in triage.  Pt complains of dry heaving and bilateral lower leg cramps for the last 2 days. Has been unable to eat or drink due to symptoms. + epigastric pain. Denies abdominal distension, fever, chills, chest pain, shortness of breath, urinary symptoms, cough, or other complaints. Reports being treated for skin cancer of right ear, cannot identify what treatment he is undergoing but denies radiation or receiving chemo infusions. No medications taken at  home.  Review of Systems  Positive: See HPI Negative: See HPI  Physical Exam  Pulse 91   Temp 98.8 F (37.1 C) (Oral)   Resp 20   Ht '5\' 11"'$  (1.803 m)   Wt 85 kg   SpO2 99%   BMI 26.12 kg/m  Gen:   Awake, no distress, mildly ill appearing, dry mucus membranes, intermittently dry heaving Resp:  Normal effort, diminished breath sounds bilaterally MSK:   Moves extremities without difficulty  Other:  Moderate diffuse upper abdominal tenderness, no rebound or guarding, feels slightly distended but patient states this is normal for him  Medical Decision Making  Medically screening exam initiated at 3:06 PM.  Appropriate orders placed.  Don [name] was informed that the remainder of the evaluation will be completed by another provider, this initial triage assessment does not replace that evaluation, and the importance of remaining in the ED until their evaluation is complete.     Suzzette Righter, PA-C 06/09/22 1511

## 2022-06-09 NOTE — ED Provider Notes (Addendum)
Register DEPT Provider Note   CSN: [id number] Arrival date & time: 06/09/22  1351     History  Chief Complaint  Patient presents with   Nausea    Don [name] is a 67 y.o. male.  HPI Patient presents with concern for intractable nausea, vomiting.  Onset was yesterday.  He had no relief until receiving Zofran just prior to my evaluation.  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  He also has a history of hypertension, but ran out of his medication sometime ago, has not been taking anything.  Today his friend gave him a a dose of calcium channel blocker, but with persistent symptoms he presents for evaluation.  He denies headache, focal weakness, dyspnea, chest pain.    Home Medications Prior to Admission medications   Medication Sig Start Date End Date Taking? Authorizing Provider  albuterol (VENTOLIN HFA) 108 (90 Base) MCG/ACT inhaler Inhale 1-2 puffs into the lungs every 4 (four) hours as needed for wheezing or shortness of breath. 09/07/21   Hazel Sams, PA-C  doxycycline (VIBRA-TABS) 100 MG tablet Take 1 tablet (100 mg total) by mouth 2 (two) times daily. 10/03/21   Maximiano Coss, NP  losartan-hydrochlorothiazide (HYZAAR) 100-12.5 MG tablet Take 1 tablet by mouth daily. 10/03/21   Maximiano Coss, NP  Multiple Vitamins-Minerals (MULTIVITAMIN WITH MINERALS) tablet Take 1 tablet by mouth daily.    [provider]  predniSONE (STERAPRED UNI-PAK 21 TAB) 10 MG (21) TBPK tablet Take per package instructions. Do not skip doses. Finish entire supply. 10/03/21   Maximiano Coss, NP  tamsulosin (FLOMAX) 0.4 MG CAPS capsule Take 1 capsule (0.4 mg total) by mouth daily. 12/26/21   Maximiano Coss, NP      Allergies    Patient has no known allergies.    Review of Systems   Review of Systems  All other systems reviewed and are negative.   Physical Exam Updated Vital  Signs BP (!) 173/94   Pulse 98   Temp 98.7 F (37.1 C)   Resp (!) 23   Ht '5\' 11"'$  (1.803 m)   Wt 85 kg   SpO2 94%   BMI 26.12 kg/m  Physical Exam Vitals and nursing note reviewed.  Constitutional:      General: He is not in acute distress.    Appearance: He is well-developed.  HENT:     Head: Normocephalic and atraumatic.     Ears:   Eyes:     Conjunctiva/sclera: Conjunctivae normal.  Cardiovascular:     Rate and Rhythm: Normal rate and regular rhythm.  Pulmonary:     Effort: Pulmonary effort is normal. No respiratory distress.     Breath sounds: No stridor.  Abdominal:     General: There is no distension.  Skin:    General: Skin is warm and dry.  Neurological:     General: No focal deficit present.     Mental Status: He is alert and oriented to person, place, and time.     Motor: Atrophy present. No weakness or tremor.     ED Results / Procedures / Treatments   Labs (all labs ordered are listed, but only abnormal results are displayed) Labs Reviewed  CBC WITH DIFFERENTIAL/PLATELET - Abnormal; Notable for the following components:      Result Value   WBC 11.8 (*)    Hemoglobin 17.3 (*)    Neutro Abs 10.1 (*)  Abs Immature Granulocytes 0.08 (*)    All other components within normal limits  COMPREHENSIVE METABOLIC PANEL - Abnormal; Notable for the following components:   Sodium 134 (*)    Glucose, Bld 137 (*)    Total Protein 8.5 (*)    AST 100 (*)    ALT 65 (*)    All other components within normal limits  URINALYSIS, ROUTINE W REFLEX MICROSCOPIC - Abnormal; Notable for the following components:   APPearance HAZY (*)    Hgb urine dipstick SMALL (*)    Ketones, ur 5 (*)    Protein, ur 30 (*)    Crystals PRESENT (*)    All other components within normal limits  LIPASE, BLOOD    EKG EKG Interpretation  Date/Time:  Sunday June 09 2022 15:28:02 EST Ventricular Rate:  103 PR Interval:  146 QRS Duration: 117 QT Interval:  368 QTC  Calculation: 482 R Axis:   -73 Text Interpretation: Sinus tachycardia Right atrial enlargement Left anterior fascicular block Low voltage, precordial leads Baseline wander in lead(s) II Abnormal ECG Confirmed by Carmin Muskrat [id number]) on 06/09/2022 3:38:16 PM  Radiology CT Head Wo Contrast  Result Date: 06/09/2022 CLINICAL DATA:  Mental status change. EXAM: CT HEAD WITHOUT CONTRAST TECHNIQUE: Contiguous axial images were obtained from the base of the skull through the vertex without intravenous contrast. RADIATION DOSE REDUCTION: This exam was performed according to the departmental dose-optimization program which includes automated exposure control, adjustment of the mA and/or kV according to patient size and/or use of iterative reconstruction technique. COMPARISON:  05/06/2011 FINDINGS: Brain: There is periventricular white matter decreased attenuation consistent with small vessel ischemic changes. Ventricles, sulci and cisterns are prominent consistent with age related involutional changes. No acute intracranial hemorrhage, mass effect or shift. No hydrocephalus. Vascular: No hyperdense vessel or unexpected calcification. Skull: Normal. Negative for fracture or focal lesion. Sinuses/Orbits: No acute finding. IMPRESSION: Atrophy and chronic small vessel ischemic changes. No acute intracranial process identified. Electronically Signed   By: Sammie Bench M.D.   On: 06/09/2022 17:34   DG Chest 2 View  Result Date: 06/09/2022 CLINICAL DATA:  Diminished breath sounds EXAM: CHEST - 2 VIEW COMPARISON:  September 10, 2021 FINDINGS: The heart size and mediastinal contours are within normal limits. Both lungs are clear. The visualized skeletal structures are unremarkable. IMPRESSION: No active cardiopulmonary disease. Electronically Signed   By: Dorise Bullion III M.D.   On: 06/09/2022 15:31    Procedures Procedures    Medications Ordered in ED Medications  labetalol (NORMODYNE) injection 20 mg (20 mg  Intravenous Given 06/09/22 1730)  losartan (COZAAR) tablet 100 mg (100 mg Oral Given 06/09/22 1609)    And  hydrochlorothiazide (HYDRODIURIL) tablet 12.5 mg (12.5 mg Oral Given 06/09/22 1609)  nicardipine (CARDENE) '20mg'$  in 0.86% saline 217m IV infusion (0.1 mg/ml) (10 mg/hr Intravenous Rate/Dose Change 06/09/22 2210)  ondansetron (ZOFRAN-ODT) disintegrating tablet 4 mg (4 mg Oral Given 06/09/22 1517)  LORazepam (ATIVAN) injection 1 mg (1 mg Intravenous Given 06/09/22 1624)  metoCLOPramide (REGLAN) injection 10 mg (10 mg Intravenous Given 06/09/22 2208)  fentaNYL (SUBLIMAZE) injection 25 mcg (25 mcg Intravenous Given 06/09/22 2207)  iohexol (OMNIPAQUE) 350 MG/ML injection 100 mL (100 mLs Intravenous Contrast Given 06/09/22 2227)    ED Course/ Medical Decision Making/ A&P This patient with a Hx of hypertension, active cancer presents to the ED for concern of persistent nausea, vomiting, this involves an extensive number of treatment options, and is a complaint that carries with it  a high risk of complications and morbidity.    The differential diagnosis includes pretense of crisis, progression of disease, intracranial abnormality secondary to hypertension, virus   Social Determinants of Health:  Cancer, advanced age  Additional history obtained:  Additional history and/or information obtained from friend at bedside, notable for details including HPI   After the initial evaluation, orders, including: Zofran beta-blocker home medication antihypertensives, labs monitoring ECG were initiated.   Patient placed on Cardiac and Pulse-Oximetry Monitors. The patient was maintained on a cardiac monitor.  The cardiac monitored showed an rhythm of 90 sinus normal The patient was also maintained on pulse oximetry. The readings were typically 100% room air normal   On repeat evaluation of the patient improved then he again had recurrence of nausea, and in spite of initial beta-blocker, hydrochlorothiazide, calcium  channel blocker he had recurrence of blood pressure to systolic greater than 160, requiring initiation of Cardene drip.  Man no chest pain, no dyspnea, but given his persistent nausea he also developed abdominal pain, additional studies, including angiography to rule out dissection was performed.  11:26 PM Patient notes resolution of symptoms. Blood pressure markedly better.  Lab Tests:  I personally interpreted labs.  The pertinent results include: Mild leukocytosis,  Imaging Studies ordered:  I independently visualized and interpreted imaging which showed head CT, chest x-ray both essentially unremarkable.  After symptoms were persistent, with nausea, and eventually described some abdominal pain, though vaguely, CT scan was performed.  This did not demonstrate acute aortic dissection, did show renal abnormality concerning for infarct versus infection, urinalysis inconsistent with infection.  Some evidence for chronic pancreatitis on CT scan as well. I agree with the radiologist interpretation   Dispostion / Final MDM:  After consideration of the diagnostic results and the patient's response to treatment, adult male with multiple medical issues, but generally in his usual state of health until the past days presents with nausea, is found to have critically abnormal blood pressure value requiring initiation of his home medications, multiple doses of beta-blocker and eventual initiation of Cardene for blood pressure control.  Given persistent blood pressure, nausea, and his eventual description of abdominal pain CT scan was performed, this did not demonstrate aortic dissection did show some evidence for possible renal infarct, less likely renal infection given the absence of fever, with urinalysis inconsistent with infection as well.  CT suggesting chronic pancreatitis may be contributing to his nausea, but again the patient had minimal abdominal pain essentially throughout his ED course.  Patient  required admission for further resuscitation, antihypertensive measures.  On admission plan is for titration down from Cardene switch to oral medication.  Final Clinical Impression(s) / ED Diagnoses Final diagnoses:  Hypertensive crisis  Bilious vomiting with nausea    CRITICAL CARE Performed by: Carmin Muskrat Total critical care time: 40 minutes Critical care time was exclusive of separately billable procedures and treating other patients. Critical care was necessary to treat or prevent imminent or life-threatening deterioration. Critical care was time spent personally by me on the following activities: development of treatment plan with patient and/or surrogate as well as nursing, discussions with consultants, evaluation of patient's response to treatment, examination of patient, obtaining history from patient or surrogate, ordering and performing treatments and interventions, ordering and review of laboratory studies, ordering and review of radiographic studies, pulse oximetry and re-evaluation of patient's condition.    Carmin Muskrat, MD 06/09/22 1093    Carmin Muskrat, MD 06/09/22 2326

## 2022-06-09 NOTE — ED Triage Notes (Signed)
Patient reports that he has had dry heaving since yesterday. Patient also added that he has bilateral leg cramping x 2 days.  Patient also reports that he has right ear cancer.

## 2022-06-09 NOTE — H&P (Signed)
History and Physical    Don [name] CBJ:[id number] DOB: 1955/07/03 DOA: 06/09/2022  PCP: Maximiano Coss, NP  Patient coming from: Home  I have personally briefly reviewed patient's old medical records in [facility]  Chief Complaint: Abdominal pain, high blood pressure  HPI: Don [name] is a 67 y.o. male with medical history significant for hypertension, skin cancer, polysubstance use (tobacco, cocaine) who presented to the ED for evaluation of nausea, abdominal pain, elevated blood pressure.  Patient states he developed abdominal pain with nausea and vomiting one day prior to admission.  He says that he ran out of his blood pressure medications a while ago and has seen that his blood pressure has been high.  He reported that his friend gave him a dose of a calcium channel blocker but due to persistent hypertension and abdominal symptoms he came to the ED for further evaluation.  Patient states that his abdominal pain and nausea have now resolved at time of admission after initial treatment in the ED.  He denies any chest pain, dyspnea.  He reports smoking half a pack of cigarettes daily.  He denies any recent alcohol or illicit drug use.  ED Course  Labs/Imaging on admission: I have personally reviewed following labs and imaging studies.  Initial vitals showed BP 218/131, pulse 95, RR 21, temp 98.8 F, SpO2 99% on room air.  Labs show WBC 11.8, hemoglobin 17.3, platelets 197,000, sodium 134, potassium 4.0, bicarb 24, BUN 16, creatinine 1.19, serum glucose 137, AST 100, ALT 65, alk phos 68, total bilirubin 1.0, lipase 25.  Urinalysis negative for UTI.  2 view chest x-ray negative for active cardiopulmonary disease.  CT head without contrast negative for acute intracranial process.  CTA chest abdomen/pelvis negative for evidence of acute dissection or aneurysm.  Wedge-shaped hypodensity in the inferior right kidney noted.  Findings compatible with chronic  pancreatitis seen.  Patient was given losartan 100 mg p.o., HCTZ 12.5 mg p.o., IV labetalol 20 mg x 2, diltiazem 120 mg p.o.  He was placed on Cardene drip for about 2 hours before this was titrated off.  The hospitalist service was consulted to admit for further evaluation and management.  Review of Systems: All systems reviewed and are negative except as documented in history of present illness above.   Past Medical History:  Diagnosis Date   Arthritis    Cancer ([place])    squamous cell "all in my face arms and hands"   Chronic pain    Depression    Headache(784.0)    Hypertension    Skin cancer     Past Surgical History:  Procedure Laterality Date   cartilage removed from both knees     KNEE ARTHROSCOPY     left wrist     SKIN CANCER EXCISION      Social History:  reports that he has been smoking cigarettes. He has a 15.00 pack-year smoking history. He has never used smokeless tobacco. He reports that he does not drink alcohol and does not use drugs.  No Known Allergies  Family History  Problem Relation Age of Onset   Cancer Father    Diabetes Father    Hypertension Father      Prior to Admission medications   Medication Sig Start Date End Date Taking? Authorizing Provider  albuterol (VENTOLIN HFA) 108 (90 Base) MCG/ACT inhaler Inhale 1-2 puffs into the lungs every 4 (four) hours as needed for wheezing or shortness of breath. 09/07/21   Phillip Heal,  Sherlon Handing, PA-C  doxycycline (VIBRA-TABS) 100 MG tablet Take 1 tablet (100 mg total) by mouth 2 (two) times daily. 10/03/21   Maximiano Coss, NP  losartan-hydrochlorothiazide (HYZAAR) 100-12.5 MG tablet Take 1 tablet by mouth daily. 10/03/21   Maximiano Coss, NP  Multiple Vitamins-Minerals (MULTIVITAMIN WITH MINERALS) tablet Take 1 tablet by mouth daily.    [provider]  predniSONE (STERAPRED UNI-PAK 21 TAB) 10 MG (21) TBPK tablet Take per package instructions. Do not skip doses. Finish entire supply. 10/03/21   Maximiano Coss, NP  tamsulosin (FLOMAX) 0.4 MG CAPS capsule Take 1 capsule (0.4 mg total) by mouth daily. 12/26/21   Maximiano Coss, NP    Physical Exam: Vitals:   06/09/22 2345 06/10/22 0000 06/10/22 0015 06/10/22 0030  BP: (!) 164/88 (!) 147/75 (!) 136/124 (!) 155/88  Pulse: 79 93 100 88  Resp: 20     Temp:      TempSrc:      SpO2: 94% 95% 97% 96%  Weight:      Height:       Constitutional: Disheveled man resting in bed in the left lateral decubitus position trying to sleep.  NAD, calm, comfortable Eyes: EOMI, lids and conjunctivae normal ENMT: Mucous membranes are dry. Posterior pharynx clear of any exudate or lesions.Normal dentition.  Neck: normal, supple, no masses. Respiratory: clear to auscultation bilaterally, no wheezing, no crackles. Normal respiratory effort. No accessory muscle use.  Cardiovascular: Regular rate and rhythm, no murmurs / rubs / gallops. No extremity edema. 2+ pedal pulses. Abdomen: no tenderness, no masses palpated.  Musculoskeletal: no clubbing / cyanosis. No joint deformity upper and lower extremities. Good ROM, no contractures. Normal muscle tone.  Skin: no rashes, lesions, ulcers. No induration Neurologic: Sensation intact. Strength equal bilaterally. Psychiatric:  Alert and oriented x 3.  EKG: Personally reviewed. Sinus tachycardia, rate 103, LAFB, motion artifact.  Rate is faster when compared to prior.  Assessment/Plan Principal Problem:   Hypertensive urgency Active Problems:   Abdominal pain   Abnormal CT scan, kidney   Substance use   Don [name] is a 67 y.o. male with medical history significant for hypertension, skin cancer, polysubstance use (tobacco, cocaine) who is admitted with hypertensive urgency.  Assessment and Plan: * Hypertensive urgency In setting of medication nonadherence.  BP stabilized after restarting home oral meds, IV labetalol push, and use of Cardene infusion. -Discontinue Cardene infusion -Continue losartan 100  mg daily and HCTZ 12.5 mg daily -IV labetalol if needed -Can add amlodipine if necessary on discharge -Follow-up with PCP  Abdominal pain Initially came in with abdominal pain and nausea/vomiting.  Now resolved with antiemetics.  CT showed changes compatible with chronic pancreatitis.  Mild AST/ALT elevation noted.  No acute hepatobiliary abnormality seen on CT.  Abnormal CT scan, kidney Incidental finding of wedge-shaped hypodensity in the inferior right kidney.  This may represent renal infarct.  No prior imaging of the kidneys available.  Infection is in the differential however UA not consistent with UTI.  Substance use Reports smoking half a pack per day.  He declines nicotine patch.  History of cocaine use but he denies recent use.  Denies recent alcohol use. -UDS and serum ethanol levels pending  DVT prophylaxis: enoxaparin (LOVENOX) injection 40 mg Start: 06/10/22 1000 Code Status: Full code Family Communication: Discussed with patient, he has discussed with his significant other Disposition Plan: From home and likely discharge to home in 1 day Consults called: None Severity of Illness: The appropriate patient  status for this patient is OBSERVATION. Observation status is judged to be reasonable and necessary in order to provide the required intensity of service to ensure the patient's safety. The patient's presenting symptoms, physical exam findings, and initial radiographic and laboratory data in the context of their medical condition is felt to place them at decreased risk for further clinical deterioration. Furthermore, it is anticipated that the patient will be medically stable for discharge from the hospital within 2 midnights of admission.   Zada Finders MD [facility]  If 7PM-7AM, please contact night-coverage [email or web address]  06/10/2022, 12:56 AM

## 2022-06-10 ENCOUNTER — Observation Stay (HOSPITAL_BASED_OUTPATIENT_CLINIC_OR_DEPARTMENT_OTHER): Payer: Medicare PPO

## 2022-06-10 ENCOUNTER — Encounter (HOSPITAL_COMMUNITY): Payer: Self-pay | Admitting: Internal Medicine

## 2022-06-10 DIAGNOSIS — I700 Atherosclerosis of aorta: Secondary | ICD-10-CM

## 2022-06-10 DIAGNOSIS — N280 Ischemia and infarction of kidney: Secondary | ICD-10-CM | POA: Diagnosis not present

## 2022-06-10 DIAGNOSIS — I160 Hypertensive urgency: Secondary | ICD-10-CM | POA: Diagnosis not present

## 2022-06-10 DIAGNOSIS — R109 Unspecified abdominal pain: Secondary | ICD-10-CM

## 2022-06-10 DIAGNOSIS — F1990 Other psychoactive substance use, unspecified, uncomplicated: Secondary | ICD-10-CM | POA: Diagnosis present

## 2022-06-10 DIAGNOSIS — R93429 Abnormal radiologic findings on diagnostic imaging of unspecified kidney: Secondary | ICD-10-CM

## 2022-06-10 HISTORY — DX: Ischemia and infarction of kidney: N28.0

## 2022-06-10 LAB — CBC
HCT: 49.2 % (ref 39.0–52.0)
Hemoglobin: 17 g/dL (ref 13.0–17.0)
MCH: 31.8 pg (ref 26.0–34.0)
MCHC: 34.6 g/dL (ref 30.0–36.0)
MCV: 92.1 fL (ref 80.0–100.0)
Platelets: 187 10*3/uL (ref 150–400)
RBC: 5.34 MIL/uL (ref 4.22–5.81)
RDW: 12.8 % (ref 11.5–15.5)
WBC: 19.3 10*3/uL — ABNORMAL HIGH (ref 4.0–10.5)
nRBC: 0 % (ref 0.0–0.2)

## 2022-06-10 LAB — RAPID URINE DRUG SCREEN, HOSP PERFORMED
Amphetamines: POSITIVE — AB
Barbiturates: NOT DETECTED
Benzodiazepines: NOT DETECTED
Cocaine: NOT DETECTED
Opiates: POSITIVE — AB
Tetrahydrocannabinol: NOT DETECTED

## 2022-06-10 LAB — BASIC METABOLIC PANEL
Anion gap: 8 (ref 5–15)
BUN: 14 mg/dL (ref 8–23)
CO2: 26 mmol/L (ref 22–32)
Calcium: 9.8 mg/dL (ref 8.9–10.3)
Chloride: 98 mmol/L (ref 98–111)
Creatinine, Ser: 0.91 mg/dL (ref 0.61–1.24)
GFR, Estimated: >60 mL/min (ref >60)
Glucose, Bld: 145 mg/dL — ABNORMAL HIGH (ref 70–99)
Potassium: 3.6 mmol/L (ref 3.5–5.1)
Sodium: 132 mmol/L — ABNORMAL LOW (ref 135–145)

## 2022-06-10 LAB — ECHOCARDIOGRAM COMPLETE
Area-P 1/2: 3.13 cm2
Height: 71 in
S' Lateral: 3.4 cm
Weight: 2996.8 oz

## 2022-06-10 LAB — HIV ANTIBODY (ROUTINE TESTING W REFLEX): HIV Screen 4th Generation wRfx: NONREACTIVE

## 2022-06-10 LAB — ETHANOL: Alcohol, Ethyl (B): <10 mg/dL (ref <10)

## 2022-06-10 MED ORDER — ACETAMINOPHEN 650 MG RE SUPP: 650.0000 mg | Freq: Four times a day (QID) | RECTAL | Status: DC | PRN

## 2022-06-10 MED ORDER — MELATONIN 5 MG PO TABS
5.0000 mg | ORAL_TABLET | Freq: Once | ORAL | Status: AC
  Administered 2022-06-10: 5 mg via ORAL
  Filled 2022-06-10: qty 1

## 2022-06-10 MED ORDER — ALBUTEROL SULFATE HFA 108 (90 BASE) MCG/ACT IN AERS: 1.0000 | INHALATION_SPRAY | RESPIRATORY_TRACT | Status: DC | PRN

## 2022-06-10 MED ORDER — ASPIRIN 81 MG PO CHEW
81.0000 mg | CHEWABLE_TABLET | Freq: Every day | ORAL | Status: DC
  Administered 2022-06-10 – 2022-06-11 (×2): 81 mg via ORAL
  Filled 2022-06-10 (×2): qty 1

## 2022-06-10 MED ORDER — SODIUM CHLORIDE 0.9% FLUSH
3.0000 mL | Freq: Two times a day (BID) | INTRAVENOUS | Status: DC
  Administered 2022-06-10 – 2022-06-11 (×4): 3 mL via INTRAVENOUS

## 2022-06-10 MED ORDER — LABETALOL HCL 5 MG/ML IV SOLN
10.0000 mg | INTRAVENOUS | Status: DC | PRN
  Administered 2022-06-10 – 2022-06-11 (×2): 10 mg via INTRAVENOUS
  Filled 2022-06-10 (×2): qty 4

## 2022-06-10 MED ORDER — ENOXAPARIN SODIUM 40 MG/0.4ML IJ SOSY
40.0000 mg | PREFILLED_SYRINGE | INTRAMUSCULAR | Status: DC
  Administered 2022-06-10 – 2022-06-11 (×2): 40 mg via SUBCUTANEOUS
  Filled 2022-06-10 (×2): qty 0.4

## 2022-06-10 MED ORDER — ONDANSETRON HCL 4 MG/2ML IJ SOLN
4.0000 mg | Freq: Four times a day (QID) | INTRAMUSCULAR | Status: DC | PRN
  Administered 2022-06-11: 4 mg via INTRAVENOUS
  Filled 2022-06-10: qty 2

## 2022-06-10 MED ORDER — ONDANSETRON HCL 4 MG PO TABS: 4.0000 mg | ORAL_TABLET | Freq: Four times a day (QID) | ORAL | Status: DC | PRN

## 2022-06-10 MED ORDER — ACETAMINOPHEN 325 MG PO TABS: 650.0000 mg | ORAL_TABLET | Freq: Four times a day (QID) | ORAL | Status: DC | PRN

## 2022-06-10 MED ORDER — ALBUTEROL SULFATE (2.5 MG/3ML) 0.083% IN NEBU: 2.5000 mg | INHALATION_SOLUTION | RESPIRATORY_TRACT | Status: DC | PRN

## 2022-06-10 NOTE — Assessment & Plan Note (Signed)
In setting of medication nonadherence.  BP stabilized after restarting home oral meds, IV labetalol push, and use of Cardene infusion. -Discontinue Cardene infusion -Continue losartan 100 mg daily and HCTZ 12.5 mg daily -IV labetalol if needed -Can add amlodipine if necessary on discharge -Follow-up with PCP

## 2022-06-10 NOTE — Assessment & Plan Note (Signed)
Reports smoking half a pack per day.  He declines nicotine patch.  History of cocaine use but he denies recent use.  Denies recent alcohol use. -UDS and serum ethanol levels pending

## 2022-06-10 NOTE — Assessment & Plan Note (Signed)
Initially came in with abdominal pain and nausea/vomiting.  Now resolved with antiemetics.  CT showed changes compatible with chronic pancreatitis.  Mild AST/ALT elevation noted.  No acute hepatobiliary abnormality seen on CT.

## 2022-06-10 NOTE — Progress Notes (Signed)
Progress Note   Patient: Don [name] TDD:[id number] DOB: 03/17/1956 DOA: 06/09/2022     0 DOS: the patient was seen and examined on 06/10/2022   Brief hospital course: 67 year old man PMH including hypertension, polysubstance abuse including cocaine who presented with intractable nausea and vomiting, having regular blood pressure medications.  Noted to be markedly hypertensive.  Treated with antihypertensives, briefly required a Cardene infusion.  Was admitted for hypertensive urgency.  Assessment and Plan: * Hypertensive urgency In setting of medication nonadherence.   BP stabilized after restarting home oral meds, IV labetalol push, and use of Cardene infusion briefly. Blood pressure now stable, continue losartan, hydrochlorothiazide 2D echocardiogram normal LVEF.  Grade 1 diastolic dysfunction  Mild hyponatremia  Na+ 132.  Supportive care.  Fasting hyperglycemia  Glucose 145.  Follow-up as an outpatient.  Leukocytosis of unclear significance.  Follow clinically.  Abdominal pain Initially came in with abdominal pain and nausea/vomiting.  Now resolved with antiemetics.  CT showed changes compatible with chronic pancreatitis.  Mild AST/ALT elevation noted.  No acute hepatobiliary abnormality seen on CT. CMP in AM.  Abnormal CT scan, kidney, suspected renal infarct Incidental finding of wedge-shaped hypodensity in the inferior right kidney.  Given clinical history this is most likely subacute renal infarct, discussed with radiology.   CTA shows no evidence of vascular disease.   2D echocardiogram showing.  No evidence of thromboemboli or in situ thrombosis at this time. Consider outpatient evaluation for underlying thrombotic embolic risk factors.  For now on low-dose aspirin.  Substance use Reports smoking half a pack per day.  Interested in quitting smoking.  History of cocaine use but he denied recent use.  Denies recent alcohol use. Serum alcohol negative  Aortic  Atherosclerosis (ICD10-I70.0).     Subjective:  Feels better  Physical Exam: Vitals:   06/10/22 0810 06/10/22 1201 06/10/22 1422 06/10/22 1848  BP: (!) 153/69 (!) 150/75 (!) 157/93 (!) 142/95  Pulse: 70 74 67 84  Resp: '18 20 18 20  '$ Temp: 98.3 F (36.8 C) (!) 97.5 F (36.4 C) 98 F (36.7 C) 98.9 F (37.2 C)  TempSrc: Oral Oral Oral Oral  SpO2: 98% 100% 97% 100%  Weight:   85 kg   Height:   '5\' 11"'$  (1.803 m)    Physical Exam Vitals reviewed.  Constitutional:      General: He is not in acute distress.    Appearance: He is not ill-appearing or toxic-appearing.  Cardiovascular:     Rate and Rhythm: Normal rate and regular rhythm.     Heart sounds: No murmur heard. Pulmonary:     Effort: Pulmonary effort is normal. No respiratory distress.     Breath sounds: No wheezing, rhonchi or rales.  Neurological:     Mental Status: He is alert.  Psychiatric:        Mood and Affect: Mood normal.        Behavior: Behavior normal.     Data Reviewed: Na+ 132 Glucose 145 fasting AST 100 ALT 65 WBC 11.8 > 19.3, new CT head negative No evidence for aortic dissection or aneurysm. 2. Wedge-shaped hypodensity in the inferior right kidney worrisome for renal infarct. Infection would be in the differential. 3. Findings compatible with chronic pancreatitis. 4. Colonic diverticulosis.   Aortic Atherosclerosis (ICD10-I70.0). EKG ST  Family Communication: none  Disposition: Status is: Observation   Planned Discharge Destination: Home    Time spent: 35 minutes  Author: Murray Hodgkins, MD 06/10/2022 7:04 PM  For on call  review [email or web address].

## 2022-06-10 NOTE — Progress Notes (Signed)
  Echocardiogram 2D Echocardiogram has been performed.  Don [name]    Echocardiogram 2D Echocardiogram has not been performed. Patient being moved to the floor.   Don [name] 06/10/2022, 2:09 PM

## 2022-06-10 NOTE — Assessment & Plan Note (Signed)
Incidental finding of wedge-shaped hypodensity in the inferior right kidney.  This may represent renal infarct.  No prior imaging of the kidneys available.  Infection is in the differential however UA not consistent with UTI.

## 2022-06-10 NOTE — Hospital Course (Addendum)
67 year old man PMH including hypertension, polysubstance abuse including cocaine who presented with intractable nausea and vomiting, having regular blood pressure medications.  Noted to be markedly hypertensive.  Treated with antihypertensives, briefly required a Cardene infusion.  Was admitted for hypertensive urgency.

## 2022-06-11 DIAGNOSIS — R93429 Abnormal radiologic findings on diagnostic imaging of unspecified kidney: Secondary | ICD-10-CM | POA: Diagnosis not present

## 2022-06-11 DIAGNOSIS — I160 Hypertensive urgency: Secondary | ICD-10-CM | POA: Diagnosis not present

## 2022-06-11 DIAGNOSIS — N280 Ischemia and infarction of kidney: Secondary | ICD-10-CM | POA: Diagnosis not present

## 2022-06-11 LAB — CBC
HCT: 50.9 % (ref 39.0–52.0)
Hemoglobin: 17.2 g/dL — ABNORMAL HIGH (ref 13.0–17.0)
MCH: 31.9 pg (ref 26.0–34.0)
MCHC: 33.8 g/dL (ref 30.0–36.0)
MCV: 94.3 fL (ref 80.0–100.0)
Platelets: 179 10*3/uL (ref 150–400)
RBC: 5.4 MIL/uL (ref 4.22–5.81)
RDW: 12.9 % (ref 11.5–15.5)
WBC: 19.6 10*3/uL — ABNORMAL HIGH (ref 4.0–10.5)
nRBC: 0 % (ref 0.0–0.2)

## 2022-06-11 LAB — COMPREHENSIVE METABOLIC PANEL
ALT: 53 U/L — ABNORMAL HIGH (ref 0–44)
AST: 44 U/L — ABNORMAL HIGH (ref 15–41)
Albumin: 3.8 g/dL (ref 3.5–5.0)
Alkaline Phosphatase: 58 U/L (ref 38–126)
Anion gap: 11 (ref 5–15)
BUN: 16 mg/dL (ref 8–23)
CO2: 27 mmol/L (ref 22–32)
Calcium: 10 mg/dL (ref 8.9–10.3)
Chloride: 97 mmol/L — ABNORMAL LOW (ref 98–111)
Creatinine, Ser: 0.96 mg/dL (ref 0.61–1.24)
GFR, Estimated: >60 mL/min (ref >60)
Glucose, Bld: 117 mg/dL — ABNORMAL HIGH (ref 70–99)
Potassium: 3.4 mmol/L — ABNORMAL LOW (ref 3.5–5.1)
Sodium: 135 mmol/L (ref 135–145)
Total Bilirubin: 1.4 mg/dL — ABNORMAL HIGH (ref 0.3–1.2)
Total Protein: 7.6 g/dL (ref 6.5–8.1)

## 2022-06-11 MED ORDER — LOSARTAN POTASSIUM-HCTZ 100-12.5 MG PO TABS: 1.0000 | ORAL_TABLET | Freq: Every day | ORAL | 2 refills | Status: DC

## 2022-06-11 MED ORDER — ASPIRIN 81 MG PO CHEW: 81.0000 mg | CHEWABLE_TABLET | Freq: Every day | ORAL | Status: AC

## 2022-06-11 MED ORDER — TAMSULOSIN HCL 0.4 MG PO CAPS: 0.4000 mg | ORAL_CAPSULE | Freq: Every day | ORAL | 2 refills | Status: DC

## 2022-06-11 MED ORDER — ALBUTEROL SULFATE HFA 108 (90 BASE) MCG/ACT IN AERS: 1.0000 | INHALATION_SPRAY | RESPIRATORY_TRACT | 2 refills | Status: DC | PRN

## 2022-06-11 MED ORDER — POTASSIUM CHLORIDE CRYS ER 20 MEQ PO TBCR
40.0000 meq | EXTENDED_RELEASE_TABLET | Freq: Once | ORAL | Status: AC
  Administered 2022-06-11: 40 meq via ORAL
  Filled 2022-06-11: qty 2

## 2022-06-11 NOTE — Discharge Summary (Signed)
Physician Discharge Summary   Patient: Don [name] MRN: [id number] DOB: April 25, 1956  Admit date:     06/09/2022  Discharge date: 06/11/22  Discharge Physician: Don [name]   PCP: Don Coss, NP   Recommendations at discharge:   * Hypertensive urgency   Fasting hyperglycemia    Abnormal CT scan, kidney, suspected renal infarct  Discharge Diagnoses: Principal Problem:   Hypertensive urgency Active Problems:   Abdominal pain   Abnormal CT scan, kidney   Substance use   Renal infarct [facility])   Aortic atherosclerosis ([place])  Resolved Problems:   * No resolved hospital problems. *  Hospital Course: 67 year old man PMH including hypertension, polysubstance abuse including cocaine who presented with intractable nausea and vomiting, having regular blood pressure medications.  Noted to be markedly hypertensive.  Treated with antihypertensives, briefly required a Cardene infusion.  Was admitted for hypertensive urgency.  Treated with antihypertensives with rapid clinical improvement.  * Hypertensive urgency In setting of medication nonadherence.  Now resolved. BP stabilized after restarting home oral meds, IV labetalol push, and use of Cardene infusion briefly. Blood pressure now stable, continue losartan, hydrochlorothiazide 2D echocardiogram normal LVEF.  Grade 1 diastolic dysfunction   Mild hyponatremia  Na+ 132.  Supportive care.   Fasting hyperglycemia  Glucose 145.  Follow-up as an outpatient.   Leukocytosis of unclear significance.   Stable. No s/s of infection. Possibly related to amphetamines Follow-up as an outpatient   Abdominal pain Resolved.  Initially came in with abdominal pain and nausea/vomiting. Now resolved with antiemetics.  CT showed changes compatible with chronic pancreatitis.  Mild AST/ALT elevation noted.  No acute hepatobiliary abnormality seen on CT. AST down to 44, ALT down to 53, total bilirubin 1.4.  LFTs been probably elevated secondary  to acute hypertension.   Abnormal CT scan, kidney, suspected renal infarct Incidental finding of wedge-shaped hypodensity in the inferior right kidney.  Given clinical history this is most likely subacute renal infarct, discussed with radiology.   CTA shows no evidence of vascular disease.   2D echocardiogram no evidence of thromboemboli or in situ thrombosis at this time. Consider outpatient evaluation for underlying thrombotic embolic risk factors.  For now treat with low-dose aspirin.   Substance use Reports smoking half a pack per day.  Interested in quitting smoking.  History of cocaine use but he denied recent use.  Denies recent alcohol use. Serum alcohol negative   Aortic Atherosclerosis (ICD10-I70.0).  Consultants:  None  Procedures performed:  None   Disposition: Home Diet recommendation:  Cardiac diet DISCHARGE MEDICATION: Allergies as of 06/11/2022   No Known Allergies      Medication List     TAKE these medications    albuterol 108 (90 Base) MCG/ACT inhaler Commonly known as: VENTOLIN HFA Inhale 1-2 puffs into the lungs every 4 (four) hours as needed for wheezing or shortness of breath.   aspirin 81 MG chewable tablet Chew 1 tablet (81 mg total) by mouth daily. Start taking on: June 12, 2022   ibuprofen 200 MG tablet Commonly known as: ADVIL Take 400 mg by mouth every 6 (six) hours as needed for mild pain.   losartan-hydrochlorothiazide 100-12.5 MG tablet Commonly known as: HYZAAR Take 1 tablet by mouth daily.   multivitamin with minerals tablet Take 1 tablet by mouth daily.   tamsulosin 0.4 MG Caps capsule Commonly known as: FLOMAX Take 1 capsule (0.4 mg total) by mouth daily.        Follow-up Information  Don Coss, NP. Schedule an appointment as soon as possible for a visit in 1 week(s).   Specialty: Nurse Practitioner Contact information: [address] [place] [address] [phone]                Feels  better  Discharge Exam: Filed Weights   06/09/22 1445 06/10/22 1422 06/11/22 0529  Weight: 85 kg 85 kg 84.9 kg   Physical Exam Vitals reviewed.  Constitutional:      General: He is not in acute distress.    Appearance: He is not ill-appearing or toxic-appearing.  Cardiovascular:     Rate and Rhythm: Normal rate and regular rhythm.     Heart sounds: No murmur heard. Pulmonary:     Effort: Pulmonary effort is normal. No respiratory distress.     Breath sounds: No wheezing, rhonchi or rales.  Neurological:     Mental Status: He is alert.  Psychiatric:        Mood and Affect: Mood normal.        Behavior: Behavior normal.      SBP 170s, DBP 90s WBC 19.6, stable UDS+ for amphetamines, opiates  Condition at discharge: good  The results of significant diagnostics from this hospitalization (including imaging, microbiology, ancillary and laboratory) are listed below for reference.   Imaging Studies: ECHOCARDIOGRAM COMPLETE  Result Date: 06/10/2022    ECHOCARDIOGRAM REPORT   Patient Name:   Don [name] Date of Exam: 06/10/2022 Medical Rec #:  [id number]          Height:       71.0 in Accession #:    [id number]         Weight:       187.3 lb Date of Birth:  April 03, 1956           BSA:          2.051 m Patient Age:    67 years           BP:           157/93 mmHg Patient Gender: M                  HR:           72 bpm. Exam Location:  Inpatient Procedure: 2D Echo, Cardiac Doppler and Color Doppler Indications:    Hypertensive emergency                 Renal infarct [facility])  History:        Patient has no prior history of Echocardiogram examinations.                 Risk Factors:Hypertensive urgency. Abdominal pain.  Sonographer:    Don [name] RDCS Referring Phys: [place]  1. Left ventricular ejection fraction, by estimation, is 55 to 60%. The left ventricle has normal function. The left ventricle has no regional wall motion abnormalities. There is mild left  ventricular hypertrophy of the basal-septal segment. Left ventricular diastolic parameters are consistent with Grade I diastolic dysfunction (impaired relaxation).  2. Right ventricular systolic function is normal. The right ventricular size is normal.  3. The mitral valve is grossly normal. No evidence of mitral valve regurgitation. No evidence of mitral stenosis.  4. The aortic valve is tricuspid. Aortic valve regurgitation is not visualized. No aortic stenosis is present.  5. The inferior vena cava is normal in size with greater than 50% respiratory variability, suggesting right atrial  pressure of 3 mmHg. FINDINGS  Left Ventricle: Left ventricular ejection fraction, by estimation, is 55 to 60%. The left ventricle has normal function. The left ventricle has no regional wall motion abnormalities. The left ventricular internal cavity size was normal in size. There is  mild left ventricular hypertrophy of the basal-septal segment. Left ventricular diastolic parameters are consistent with Grade I diastolic dysfunction (impaired relaxation). Right Ventricle: The right ventricular size is normal. No increase in right ventricular wall thickness. Right ventricular systolic function is normal. Left Atrium: Left atrial size was normal in size. Right Atrium: Right atrial size was normal in size. Pericardium: There is no evidence of pericardial effusion. Mitral Valve: The mitral valve is grossly normal. No evidence of mitral valve regurgitation. No evidence of mitral valve stenosis. Tricuspid Valve: The tricuspid valve is grossly normal. Tricuspid valve regurgitation is not demonstrated. No evidence of tricuspid stenosis. Aortic Valve: The aortic valve is tricuspid. Aortic valve regurgitation is not visualized. No aortic stenosis is present. Pulmonic Valve: The pulmonic valve was grossly normal. Pulmonic valve regurgitation is not visualized. No evidence of pulmonic stenosis. Aorta: The aortic root and ascending aorta are  structurally normal, with no evidence of dilitation. Venous: The inferior vena cava is normal in size with greater than 50% respiratory variability, suggesting right atrial pressure of 3 mmHg. IAS/Shunts: No atrial level shunt detected by color flow Doppler.  LEFT VENTRICLE PLAX 2D LVIDd:         4.70 cm   Diastology LVIDs:         3.40 cm   LV e' medial:    7.18 cm/s LV PW:         0.60 cm   LV E/e' medial:  12.9 LV IVS:        1.30 cm   LV e' lateral:   8.27 cm/s LVOT diam:     2.20 cm   LV E/e' lateral: 11.2 LV SV:         89 LV SV Index:   43 LVOT Area:     3.80 cm  RIGHT VENTRICLE RV S prime:     18.20 cm/s TAPSE (M-mode): 2.0 cm LEFT ATRIUM             Index        RIGHT ATRIUM           Index LA diam:        3.60 cm 1.76 cm/m   RA Area:     16.50 cm LA Vol (A2C):   57.7 ml 28.14 ml/m  RA Volume:   41.10 ml  20.04 ml/m LA Vol (A4C):   44.3 ml 21.60 ml/m LA Biplane Vol: 51.1 ml 24.92 ml/m  AORTIC VALVE LVOT Vmax:   141.00 cm/s LVOT Vmean:  92.600 cm/s LVOT VTI:    0.234 m  AORTA Ao Root diam: 2.70 cm MITRAL VALVE MV Area (PHT): 3.13 cm     SHUNTS MV Decel Time: 242 msec     Systemic VTI:  0.23 m MV E velocity: 92.50 cm/s   Systemic Diam: 2.20 cm MV A velocity: 101.00 cm/s MV E/A ratio:  0.92 Candee Furbish MD Electronically signed by Candee Furbish MD Signature Date/Time: 06/10/2022/4:04:32 PM    Final    CT Angio Chest/Abd/Pel for Dissection W and/or Wo Contrast  Result Date: 06/09/2022 CLINICAL DATA:  Acute aortic syndrome suspected.  Epigastric pain. EXAM: CT ANGIOGRAPHY CHEST, ABDOMEN AND PELVIS TECHNIQUE: Non-contrast CT of the chest was initially obtained. Multidetector  CT imaging through the chest, abdomen and pelvis was performed using the standard protocol during bolus administration of intravenous contrast. Multiplanar reconstructed images and MIPs were obtained and reviewed to evaluate the vascular anatomy. RADIATION DOSE REDUCTION: This exam was performed according to the departmental  dose-optimization program which includes automated exposure control, adjustment of the mA and/or kV according to patient size and/or use of iterative reconstruction technique. CONTRAST:  176m OMNIPAQUE IOHEXOL 350 MG/ML SOLN COMPARISON:  None Available. FINDINGS: CTA CHEST FINDINGS Cardiovascular: Preferential opacification of the thoracic aorta. No evidence of thoracic aortic aneurysm or dissection. Normal heart size. No pericardial effusion. There are atherosclerotic calcifications of the aorta. Mediastinum/Nodes: No enlarged mediastinal, hilar, or axillary lymph nodes. Thyroid gland, trachea, and esophagus demonstrate no significant findings. Lungs/Pleura: Lungs are clear. No pleural effusion or pneumothorax. Musculoskeletal: No chest wall abnormality. No acute or significant osseous findings. Review of the MIP images confirms the above findings. CTA ABDOMEN AND PELVIS FINDINGS VASCULAR Aorta: Normal caliber aorta without aneurysm, dissection, vasculitis or significant stenosis. There are atherosclerotic calcifications of the aorta. Celiac: Patent without evidence of aneurysm, dissection, vasculitis or significant stenosis. SMA: Patent without evidence of aneurysm, dissection, vasculitis or significant stenosis. Renals: Both renal arteries are patent without evidence of aneurysm, dissection, vasculitis, fibromuscular dysplasia or significant stenosis. IMA: Patent without evidence of aneurysm, dissection, vasculitis or significant stenosis. Inflow: Patent without evidence of aneurysm, dissection, vasculitis or significant stenosis. Veins: No obvious venous abnormality within the limitations of this arterial phase study. Review of the MIP images confirms the above findings. NON-VASCULAR Hepatobiliary: No focal liver abnormality is seen. No gallstones, gallbladder wall thickening, or biliary dilatation. Pancreas: Pancreatic calcifications are seen in the head compatible with chronic pancreatitis. No pancreatic  ductal dilatation or surrounding inflammatory changes. Spleen: Normal in size without focal abnormality. Adrenals/Urinary Tract: There is wedge-shaped hypodensity within the inferior right kidney without contour abnormality. This may represent renal infarct. Infection would also be in the differential. There is no evidence for hydronephrosis. Bilateral renal cysts are present measuring up to 6.5 cm in the left kidney. The adrenal glands and bladder are within normal limits. Stomach/Bowel: Stomach is within normal limits. Appendix appears normal. No evidence of bowel wall thickening, distention, or inflammatory changes. There is sigmoid colon diverticulosis. Lymphatic: No enlarged lymph nodes are identified. Reproductive: Prostate is unremarkable. Other: No abdominal wall hernia or abnormality. No abdominopelvic ascites. Musculoskeletal: No acute or significant osseous findings. Review of the MIP images confirms the above findings. IMPRESSION: 1. No evidence for aortic dissection or aneurysm. 2. Wedge-shaped hypodensity in the inferior right kidney worrisome for renal infarct. Infection would be in the differential. 3. Findings compatible with chronic pancreatitis. 4. Colonic diverticulosis. Aortic Atherosclerosis (ICD10-I70.0). Electronically Signed   By: ARonney AstersM.D.   On: 06/09/2022 22:54   CT Head Wo Contrast  Result Date: 06/09/2022 CLINICAL DATA:  Mental status change. EXAM: CT HEAD WITHOUT CONTRAST TECHNIQUE: Contiguous axial images were obtained from the base of the skull through the vertex without intravenous contrast. RADIATION DOSE REDUCTION: This exam was performed according to the departmental dose-optimization program which includes automated exposure control, adjustment of the mA and/or kV according to patient size and/or use of iterative reconstruction technique. COMPARISON:  05/06/2011 FINDINGS: Brain: There is periventricular white matter decreased attenuation consistent with small vessel  ischemic changes. Ventricles, sulci and cisterns are prominent consistent with age related involutional changes. No acute intracranial hemorrhage, mass effect or shift. No hydrocephalus. Vascular: No hyperdense vessel or unexpected  calcification. Skull: Normal. Negative for fracture or focal lesion. Sinuses/Orbits: No acute finding. IMPRESSION: Atrophy and chronic small vessel ischemic changes. No acute intracranial process identified. Electronically Signed   By: Sammie Bench M.D.   On: 06/09/2022 17:34   DG Chest 2 View  Result Date: 06/09/2022 CLINICAL DATA:  Diminished breath sounds EXAM: CHEST - 2 VIEW COMPARISON:  September 10, 2021 FINDINGS: The heart size and mediastinal contours are within normal limits. Both lungs are clear. The visualized skeletal structures are unremarkable. IMPRESSION: No active cardiopulmonary disease. Electronically Signed   By: Dorise Bullion III M.D.   On: 06/09/2022 15:31    Microbiology: Results for orders placed or performed during the hospital encounter of 09/10/21  Resp Panel by RT-PCR (Flu A&B, Covid) Nasopharyngeal Swab     Status: None   Collection Time: 09/10/21  5:37 PM   Specimen: Nasopharyngeal Swab; Nasopharyngeal(NP) swabs in vial transport medium  Result Value Ref Range Status   SARS Coronavirus 2 by RT PCR NEGATIVE NEGATIVE Final    Comment: (NOTE) SARS-CoV-2 target nucleic acids are NOT DETECTED.  The SARS-CoV-2 RNA is generally detectable in upper respiratory specimens during the acute phase of infection. The lowest concentration of SARS-CoV-2 viral copies this assay can detect is 138 copies/mL. A negative result does not preclude SARS-Cov-2 infection and should not be used as the sole basis for treatment or other patient management decisions. A negative result may occur with  improper specimen collection/handling, submission of specimen other than nasopharyngeal swab, presence of viral mutation(s) within the areas targeted by this assay, and  inadequate number of viral copies(<138 copies/mL). A negative result must be combined with clinical observations, patient history, and epidemiological information. The expected result is Negative.  Fact Sheet for Patients:  EntrepreneurPulse.com.au  Fact Sheet for Healthcare Providers:  IncredibleEmployment.be  This test is no t yet approved or cleared by the [place] FDA and  has been authorized for detection and/or diagnosis of SARS-CoV-2 by FDA under an Emergency Use Authorization (EUA). This EUA will remain  in effect (meaning this test can be used) for the duration of the COVID-19 declaration under Section 564(b)(1) of the Act, 21 U.S.C.section 360bbb-3(b)(1), unless the authorization is terminated  or revoked sooner.       Influenza A by PCR NEGATIVE NEGATIVE Final   Influenza B by PCR NEGATIVE NEGATIVE Final    Comment: (NOTE) The Xpert Xpress SARS-CoV-2/FLU/RSV plus assay is intended as an aid in the diagnosis of influenza from Nasopharyngeal swab specimens and should not be used as a sole basis for treatment. Nasal washings and aspirates are unacceptable for Xpert Xpress SARS-CoV-2/FLU/RSV testing.  Fact Sheet for Patients: EntrepreneurPulse.com.au  Fact Sheet for Healthcare Providers: IncredibleEmployment.be  This test is not yet approved or cleared by the [place] FDA and has been authorized for detection and/or diagnosis of SARS-CoV-2 by FDA under an Emergency Use Authorization (EUA). This EUA will remain in effect (meaning this test can be used) for the duration of the COVID-19 declaration under Section 564(b)(1) of the Act, 21 U.S.C. section 360bbb-3(b)(1), unless the authorization is terminated or revoked.  Performed at [facility], [address] [place], [place] [address]     Labs: CBC: Recent Labs  Lab 06/09/22 1506 06/10/22 0509 06/11/22 0450  WBC  11.8* 19.3* 19.6*  NEUTROABS 10.1*  --   --   HGB 17.3* 17.0 17.2*  HCT 51.4 49.2 50.9  MCV 94.0 92.1 94.3  PLT 197 187 179  Basic Metabolic Panel: Recent Labs  Lab 06/09/22 1506 06/10/22 0509 06/11/22 0450  NA 134* 132* 135  K 4.0 3.6 3.4*  CL 99 98 97*  CO2 '24 26 27  '$ GLUCOSE 137* 145* 117*  BUN '16 14 16  '$ CREATININE 1.19 0.91 0.96  CALCIUM 9.9 9.8 10.0   Liver Function Tests: Recent Labs  Lab 06/09/22 1506 06/11/22 0450  AST 100* 44*  ALT 65* 53*  ALKPHOS 68 58  BILITOT 1.0 1.4*  PROT 8.5* 7.6  ALBUMIN 4.7 3.8   CBG: No results for input(s): "GLUCAP" in the last 168 hours.  Discharge time spent: less than 30 minutes.  Signed: Murray Hodgkins, MD [facility] 06/11/2022

## 2022-07-22 DIAGNOSIS — Z720 Tobacco use: Secondary | ICD-10-CM | POA: Insufficient documentation

## 2022-07-22 DIAGNOSIS — C44222 Squamous cell carcinoma of skin of right ear and external auricular canal: Secondary | ICD-10-CM | POA: Insufficient documentation

## 2022-07-22 DIAGNOSIS — H61111 Acquired deformity of pinna, right ear: Secondary | ICD-10-CM | POA: Insufficient documentation

## 2022-08-09 ENCOUNTER — Encounter: Payer: Self-pay | Admitting: Family Medicine

## 2022-08-09 ENCOUNTER — Ambulatory Visit (INDEPENDENT_AMBULATORY_CARE_PROVIDER_SITE_OTHER): Payer: Medicare PPO | Admitting: Family Medicine

## 2022-08-09 ENCOUNTER — Ambulatory Visit: Payer: Medicare PPO | Admitting: Family Medicine

## 2022-08-09 VITALS — BP 108/58 | HR 65 | Temp 98.0°F | Resp 17 | Ht 71.0 in | Wt 196.4 lb

## 2022-08-09 DIAGNOSIS — I10 Essential (primary) hypertension: Secondary | ICD-10-CM

## 2022-08-09 DIAGNOSIS — R351 Nocturia: Secondary | ICD-10-CM | POA: Diagnosis not present

## 2022-08-09 DIAGNOSIS — Z716 Tobacco abuse counseling: Secondary | ICD-10-CM

## 2022-08-09 DIAGNOSIS — Z1211 Encounter for screening for malignant neoplasm of colon: Secondary | ICD-10-CM

## 2022-08-09 DIAGNOSIS — R0989 Other specified symptoms and signs involving the circulatory and respiratory systems: Secondary | ICD-10-CM

## 2022-08-09 LAB — CBC WITH DIFFERENTIAL/PLATELET
Basophils Absolute: 0 10*3/uL (ref 0.0–0.1)
Basophils Relative: 0.4 % (ref 0.0–3.0)
Eosinophils Absolute: 0.3 10*3/uL (ref 0.0–0.7)
Eosinophils Relative: 4.2 % (ref 0.0–5.0)
HCT: 42.2 % (ref 39.0–52.0)
Hemoglobin: 14.2 g/dL (ref 13.0–17.0)
Lymphocytes Relative: 24.2 % (ref 12.0–46.0)
Lymphs Abs: 1.7 10*3/uL (ref 0.7–4.0)
MCHC: 33.7 g/dL (ref 30.0–36.0)
MCV: 93.5 fl (ref 78.0–100.0)
Monocytes Absolute: 0.7 10*3/uL (ref 0.1–1.0)
Monocytes Relative: 9.9 % (ref 3.0–12.0)
Neutro Abs: 4.3 10*3/uL (ref 1.4–7.7)
Neutrophils Relative %: 61.3 % (ref 43.0–77.0)
Platelets: 205 10*3/uL (ref 150.0–400.0)
RBC: 4.51 Mil/uL (ref 4.22–5.81)
RDW: 13.8 % (ref 11.5–15.5)
WBC: 7.1 10*3/uL (ref 4.0–10.5)

## 2022-08-09 LAB — COMPREHENSIVE METABOLIC PANEL
ALT: 29 U/L (ref 0–53)
AST: 34 U/L (ref 0–37)
Albumin: 3.8 g/dL (ref 3.5–5.2)
Alkaline Phosphatase: 79 U/L (ref 39–117)
BUN: 11 mg/dL (ref 6–23)
CO2: 30 mEq/L (ref 19–32)
Calcium: 10.4 mg/dL (ref 8.4–10.5)
Chloride: 101 mEq/L (ref 96–112)
Creatinine, Ser: 0.97 mg/dL (ref 0.40–1.50)
GFR: 80.12 mL/min (ref >60.00)
Glucose, Bld: 86 mg/dL (ref 70–99)
Potassium: 4.6 mEq/L (ref 3.5–5.1)
Sodium: 137 mEq/L (ref 135–145)
Total Bilirubin: 0.5 mg/dL (ref 0.2–1.2)
Total Protein: 7 g/dL (ref 6.0–8.3)

## 2022-08-09 LAB — POCT INFLUENZA A/B
Influenza A, POC: NEGATIVE
Influenza B, POC: NEGATIVE

## 2022-08-09 LAB — POCT RESPIRATORY SYNCYTIAL VIRUS: RSV Rapid Ag: NEGATIVE

## 2022-08-09 LAB — POC COVID19 BINAXNOW: SARS Coronavirus 2 Ag: NEGATIVE

## 2022-08-09 MED ORDER — BENZONATATE 100 MG PO CAPS: 100.0000 mg | ORAL_CAPSULE | Freq: Two times a day (BID) | ORAL | 0 refills | Status: AC | PRN

## 2022-08-09 MED ORDER — TAMSULOSIN HCL 0.4 MG PO CAPS: 0.4000 mg | ORAL_CAPSULE | Freq: Every day | ORAL | 2 refills | Status: DC

## 2022-08-09 MED ORDER — LOSARTAN POTASSIUM-HCTZ 100-12.5 MG PO TABS: 1.0000 | ORAL_TABLET | Freq: Every day | ORAL | 2 refills | Status: DC

## 2022-08-09 NOTE — Assessment & Plan Note (Signed)
Continue with Tamsulosin 0.'4mg'$  daily. Refilled medication. Patient reports medication is effective.

## 2022-08-09 NOTE — Progress Notes (Signed)
New Patient Office Visit  Subjective    Patient ID: Don [name], male    DOB: 1956/01/27  Age: 67 y.o. MRN: [id number]  CC:  Chief Complaint  Patient presents with   Establish Care    Est care Pt here to est care with Wavie Hashimi  Pt states he needs a refill on his BP medication      Nasal Congestion    Has some congestion x 4 days  No fever     HPI Don [name] presents to establish care with new provider.   HTN: Chronic. Patient takes Losartan/HCTZ 100/12.5 mg daily. Denies monitoring his blood pressure at home. Denies chest pain, dizziness, lightheadedness, headache, or lower extremity edema. SHOB every once in a while, usually when he smokes a lot.  BP Readings from Last 3 Encounters:  08/09/22 (!) 108/58  06/11/22 (!) 164/97  10/03/21 (!) 170/102    Patient was hospitalized at [facility] for hypertensive urgency and abnormal CT scan of kidney, likely subacute renal infarct. He was started on a low dose of ASA.   Nocturia: Patient is taking Tamsulosin 0.'4mg'$  daily. He reports it does help with him urinating. He took his last pill yesterday. Requesting a refill.   He reports has had a productive cough-thick, yellow mucous, nasal congestion, & fever (unmeasured),  Started about 4-5 days.   Denies sore throat, ear pain, headache, chest pain, shortness of breath.  Patient reports he is having right ear lobe surgery on March 26-27 for skin cancer.    Outpatient Encounter Medications as of 08/09/2022  Medication Sig   albuterol (VENTOLIN HFA) 108 (90 Base) MCG/ACT inhaler Inhale 1-2 puffs into the lungs every 4 (four) hours as needed for wheezing or shortness of breath.   aspirin 81 MG chewable tablet Chew 1 tablet (81 mg total) by mouth daily.   benzonatate (TESSALON) 100 MG capsule Take 1 capsule (100 mg total) by mouth 2 (two) times daily as needed for cough.   ibuprofen (ADVIL) 200 MG tablet Take 400 mg by mouth every 6 (six) hours as needed for mild  pain.   Multiple Vitamins-Minerals (MULTIVITAMIN WITH MINERALS) tablet Take 1 tablet by mouth daily.   [DISCONTINUED] losartan (COZAAR) 50 MG tablet Take 1 tablet by mouth daily.   [DISCONTINUED] losartan-hydrochlorothiazide (HYZAAR) 100-12.5 MG tablet Take 1 tablet by mouth daily.   [DISCONTINUED] tamsulosin (FLOMAX) 0.4 MG CAPS capsule Take 1 capsule (0.4 mg total) by mouth daily.   losartan-hydrochlorothiazide (HYZAAR) 100-12.5 MG tablet Take 1 tablet by mouth daily.   tamsulosin (FLOMAX) 0.4 MG CAPS capsule Take 1 capsule (0.4 mg total) by mouth daily.   [DISCONTINUED] buPROPion (WELLBUTRIN SR) 150 MG 12 hr tablet Take by mouth. (Patient not taking: Reported on 08/09/2022)   [DISCONTINUED] buPROPion (WELLBUTRIN SR) 150 MG 12 hr tablet Take by mouth. (Patient not taking: Reported on 08/09/2022)   [DISCONTINUED] diclofenac (VOLTAREN) 75 MG EC tablet Take by mouth. (Patient not taking: Reported on 08/09/2022)   [DISCONTINUED] diclofenac (VOLTAREN) 75 MG EC tablet Take by mouth. (Patient not taking: Reported on 08/09/2022)   [DISCONTINUED] diltiazem (CARDIZEM CD) 120 MG 24 hr capsule Take by mouth. (Patient not taking: Reported on 08/09/2022)   [DISCONTINUED] traMADol (ULTRAM) 50 MG tablet Take by mouth. (Patient not taking: Reported on 08/09/2022)   No facility-administered encounter medications on file as of 08/09/2022.    Past Medical History:  Diagnosis Date   Arthritis    Cancer ([employer])    squamous cell "  all in my face arms and hands"   Chronic pain    Depression    Headache(784.0)    Hypertension    Renal infarct ([place]) 06/10/2022   Skin cancer     Past Surgical History:  Procedure Laterality Date   cartilage removed from both knees     KNEE ARTHROSCOPY     left wrist     SKIN CANCER EXCISION      Family History  Problem Relation Age of Onset   Cancer Father        Skin   Diabetes Father    Hypertension Father     Social History   Socioeconomic History   Marital status: Single     Spouse name: Not on file   Number of children: 2   Years of education: Not on file   Highest education level: Some college, no degree  Occupational History   Occupation: Retired  Tobacco Use   Smoking status: Every Day    Packs/day: 0.50    Years: 30.00    Total pack years: 15.00    Types: Cigarettes   Smokeless tobacco: Never  Vaping Use   Vaping Use: Never used  Substance and Sexual Activity   Alcohol use: No   Drug use: No   Sexual activity: Not on file  Other Topics Concern   Not on file  Social History Narrative   Patient lives girlfriend and another gentleman. No pets.    Social Determinants of Health   Financial Resource Strain: Not on file  Food Insecurity: No Food Insecurity (06/10/2022)   Hunger Vital Sign    Worried About Running Out of Food in the Last Year: Never true    Ran Out of Food in the Last Year: Never true  Transportation Needs: No Transportation Needs (06/10/2022)   PRAPARE - Hydrologist (Medical): No    Lack of Transportation (Non-Medical): No  Physical Activity: Not on file  Stress: Not on file  Social Connections: Not on file  Intimate Partner Violence: Not At Risk (06/10/2022)   Humiliation, Afraid, Rape, and Kick questionnaire    Fear of Current or Ex-Partner: No    Emotionally Abused: No    Physically Abused: No    Sexually Abused: No    ROS See HPI above    Objective    BP (!) 108/58   Pulse 65   Temp 98 F (36.7 C) (Temporal)   Resp 17   Ht '5\' 11"'$  (1.803 m)   Wt 196 lb 6 oz (89.1 kg)   SpO2 96%   BMI 27.39 kg/m   Physical Exam Vitals reviewed.  Constitutional:      General: He is not in acute distress.    Appearance: Normal appearance. He is not ill-appearing, toxic-appearing or diaphoretic.  HENT:     Head: Normocephalic and atraumatic.     Right Ear: There is impacted cerumen.     Left Ear: Tympanic membrane and ear canal normal.     Ears:     Comments: Skin cancer noted to right  external ear.     Nose:     Right Sinus: No maxillary sinus tenderness or frontal sinus tenderness.     Left Sinus: No maxillary sinus tenderness or frontal sinus tenderness.     Mouth/Throat:     Mouth: Mucous membranes are moist.     Pharynx: No oropharyngeal exudate or posterior oropharyngeal erythema.  Eyes:     General:  Right eye: No discharge.        Left eye: No discharge.     Conjunctiva/sclera: Conjunctivae normal.  Cardiovascular:     Rate and Rhythm: Normal rate and regular rhythm.     Heart sounds: Normal heart sounds. No murmur heard.    No friction rub. No gallop.  Pulmonary:     Effort: Pulmonary effort is normal. No respiratory distress.     Breath sounds: Normal breath sounds.  Musculoskeletal:        General: Normal range of motion.  Skin:    General: Skin is warm and dry.  Neurological:     General: No focal deficit present.     Mental Status: He is alert and oriented to person, place, and time. Mental status is at baseline.  Psychiatric:        Mood and Affect: Mood normal.        Behavior: Behavior normal.        Thought Content: Thought content normal.        Judgment: Judgment normal.      Assessment & Plan:  Upper respiratory symptom -     POCT Influenza A/B -     POC COVID-19 BinaxNow -     POCT respiratory syncytial virus -     CBC with Differential/Platelet -     Benzonatate; Take 1 capsule (100 mg total) by mouth 2 (two) times daily as needed for cough.  Dispense: 20 capsule; Refill: 0  Primary hypertension Assessment & Plan: Blood pressure is stable. Continue with Losartan/HCTZ 100/12.'5mg'$  daily. Refilled medication. Ordered CBC and CMP.   Orders: -     Losartan Potassium-HCTZ; Take 1 tablet by mouth daily.  Dispense: 30 tablet; Refill: 2 -     Comprehensive metabolic panel -     CBC with Differential/Platelet  Nocturia Assessment & Plan: Continue with Tamsulosin 0.'4mg'$  daily. Refilled medication. Patient reports medication is  effective.    Orders: -     Tamsulosin HCl; Take 1 capsule (0.4 mg total) by mouth daily.  Dispense: 30 capsule; Refill: 2  Tobacco abuse counseling Assessment & Plan: Discussed about tobacco abuse and cessation. He wants to quit smoking. Declines a referral to smoking cessation program and medication. He reports he wants to stop smoking at once, all of sudden. Provided written information about managing to stop smoking.    Colon cancer screening -     Cologuard   Needs AWV visit when available via telehealth.  Reviewed health maintenance: declines Shingrix vaccine; declines PNA vaccine;  Ordered cologuard screening colon cancer.  Prescribed Tessalon '100mg'$ , 1 tablet twice a day for cough. Recommend to take over the counter Mucinex for cough and Tylenol '1000mg'$ , 1 tablet every 8 hours as needed for symptoms.  After visit, seen in his discharge note from [facility] in January 2024, that he may need to have an outpatient evaluation for underlying thrombotic embolic risk factors (hypercoagulability workup) since he had an abnormal CT kidney, likely subacute renal infarct. He is currently on ASA. Would like to refer patient to hematology for further workup, tried to call patient after visit to ask if he would be acceptable to referral. He didn't answer, will try to reach him on Monday. If he agrees, will send referral.  Return in about 3 months (around 11/09/2022) for chronic management.   Valarie Merino, NP

## 2022-08-09 NOTE — Assessment & Plan Note (Addendum)
Discussed about tobacco abuse and cessation. He wants to quit smoking. Declines a referral to smoking cessation program and medication. He reports he wants to stop smoking at once, all of sudden. Provided written information about managing to stop smoking.

## 2022-08-09 NOTE — Assessment & Plan Note (Signed)
Blood pressure is stable. Continue with Losartan/HCTZ 100/12.'5mg'$  daily. Refilled medication. Ordered CBC and CMP.

## 2022-08-09 NOTE — Patient Instructions (Addendum)
It was a pleasure to meet you and I look forward to taking care of you. -Ordered cologuard for colon cancer screening. It will be mailed to you.  -Refilled Losartan/HCTZ for blood pressure and Flomax for nocturia.  -Negative rapid for covid, RSV, and flu.  -Prescribed Tessalon '100mg'$ , 1 tablet twice a day for cough. -Recommend to take over the counter Mucinex for cough.  -You can take Tylenol '1000mg'$ , 1 tablet every 8 days.  -Follow up in 3 months.  -STOP SMOKING!!! If you change your mind about taking medications or wanting to get into a smoking cessation program, please call back to the office or send a MyChart.

## 2022-08-12 NOTE — Progress Notes (Signed)
Called patient to notify him of these results.    Pine  P:[phone]

## 2022-08-20 ENCOUNTER — Other Ambulatory Visit: Payer: Self-pay | Admitting: Otolaryngology

## 2022-08-23 NOTE — Pre-Procedure Instructions (Signed)
Surgical Instructions    Your procedure is scheduled on August 28, 2022.  Report to [facility] Main Entrance "A" at 9:30 A.M., then check in with the Admitting office.  Call this number if you have problems the morning of surgery:  [phone]  If you have any questions prior to your surgery date call [phone]: Open Monday-Friday 8am-4pm If you experience any cold or flu symptoms such as cough, fever, chills, shortness of breath, etc. between now and your scheduled surgery, please notify us at the above number.     Remember:  Do not eat after midnight the night before your surgery  You may drink clear liquids until 8:30 AM the morning of your surgery.   Clear liquids allowed are: Water, Non-Citrus Juices (without pulp), Carbonated Beverages, Clear Tea, Black Coffee Only (NO MILK, CREAM OR POWDERED CREAMER of any kind), and Gatorade.     Take these medicines the morning of surgery with A SIP OF WATER:  tamsulosin (FLOMAX)   albuterol (VENTOLIN HFA) inhaler - may take if needed     Follow your surgeon's instructions on when to stop Aspirin.  If no instructions were given by your surgeon then you will need to call the office to get those instructions.     As of today, STOP taking any Aleve, Naproxen, Ibuprofen, Motrin, Advil, Goody's, BC's, all herbal medications, fish oil, and all vitamins.                     Do NOT Smoke (Tobacco/Vaping) for 24 hours prior to your procedure.  If you use a CPAP at night, you may bring your mask/headgear for your overnight stay.   Contacts, glasses, piercing's, hearing aid's, dentures or partials may not be worn into surgery, please bring cases for these belongings.    For patients admitted to the hospital, discharge time will be determined by your treatment team.   Patients discharged the day of surgery will not be allowed to drive home, and someone needs to stay with them for 24 hours.  SURGICAL WAITING ROOM VISITATION Patients having  surgery or a procedure may have no more than 2 support people in the waiting area - these visitors may rotate.   Children under the age of 75 must have an adult with them who is not the patient. If the patient needs to stay at the hospital during part of their recovery, the visitor guidelines for inpatient rooms apply. Pre-op nurse will coordinate an appropriate time for 1 support person to accompany patient in pre-op.  This support person may not rotate.   Please refer to the [facility] website for the visitor guidelines for Inpatients (after your surgery is over and you are in a regular room).    Special instructions:   [place]- Preparing For Surgery  Before surgery, you can play an important role. Because skin is not sterile, your skin needs to be as free of germs as possible. You can reduce the number of germs on your skin by washing with CHG (chlorahexidine gluconate) Soap before surgery.  CHG is an antiseptic cleaner which kills germs and bonds with the skin to continue killing germs even after washing.    Oral Hygiene is also important to reduce your risk of infection.  Remember - BRUSH YOUR TEETH THE MORNING OF SURGERY WITH YOUR REGULAR TOOTHPASTE  Please do not use if you have an allergy to CHG or antibacterial soaps. If your skin becomes reddened/irritated stop using the CHG.  Do not shave (including legs and underarms) for at least 48 hours prior to first CHG shower. It is OK to shave your face.  Please follow these instructions carefully.   Shower the NIGHT BEFORE SURGERY and the MORNING OF SURGERY  If you chose to wash your hair, wash your hair first as usual with your normal shampoo.  After you shampoo, rinse your hair and body thoroughly to remove the shampoo.  Use CHG Soap as you would any other liquid soap. You can apply CHG directly to the skin and wash gently with a scrungie or a clean washcloth.   Apply the CHG Soap to your body ONLY FROM THE NECK DOWN.  Do not use  on open wounds or open sores. Avoid contact with your eyes, ears, mouth and genitals (private parts). Wash Face and genitals (private parts)  with your normal soap.   Wash thoroughly, paying special attention to the area where your surgery will be performed.  Thoroughly rinse your body with warm water from the neck down.  DO NOT shower/wash with your normal soap after using and rinsing off the CHG Soap.  Pat yourself dry with a CLEAN TOWEL.  Wear CLEAN PAJAMAS to bed the night before surgery  Place CLEAN SHEETS on your bed the night before your surgery  DO NOT SLEEP WITH PETS.   Day of Surgery: Take a shower with CHG soap. Do not wear jewelry or makeup Do not wear lotions, powders, perfumes/colognes, or deodorant. Do not shave 48 hours prior to surgery.  Men may shave face and neck. Do not bring valuables to the hospital.  [facility] is not responsible for any belongings or valuables. Do not wear nail polish, gel polish, artificial nails, or any other type of covering on natural nails (fingers and toes) If you have artificial nails or gel coating that need to be removed by a nail salon, please have this removed prior to surgery. Artificial nails or gel coating may interfere with anesthesia's ability to adequately monitor your vital signs.  Wear Clean/Comfortable clothing the morning of surgery Remember to brush your teeth WITH YOUR REGULAR TOOTHPASTE.   Please read over the following fact sheets that you were given.    If you received a COVID test during your pre-op visit  it is requested that you wear a mask when out in public, stay away from anyone that may not be feeling well and notify your surgeon if you develop symptoms. If you have been in contact with anyone that has tested positive in the last 10 days please notify you surgeon.

## 2022-08-26 ENCOUNTER — Other Ambulatory Visit: Payer: Self-pay

## 2022-08-26 ENCOUNTER — Encounter (HOSPITAL_COMMUNITY): Payer: Self-pay

## 2022-08-26 ENCOUNTER — Encounter (HOSPITAL_COMMUNITY)
Admission: RE | Admit: 2022-08-26 | Discharge: 2022-08-26 | Disposition: A | Discharge status: Home / Self Care | Payer: Medicare PPO | Source: Ambulatory Visit | Attending: Otolaryngology | Admitting: Otolaryngology

## 2022-08-26 VITALS — BP 152/79 | HR 57 | Temp 97.5°F | Resp 18 | Ht 71.0 in | Wt 191.4 lb

## 2022-08-26 DIAGNOSIS — I119 Hypertensive heart disease without heart failure: Secondary | ICD-10-CM | POA: Insufficient documentation

## 2022-08-26 DIAGNOSIS — I10 Essential (primary) hypertension: Secondary | ICD-10-CM | POA: Diagnosis not present

## 2022-08-26 DIAGNOSIS — Z01818 Encounter for other preprocedural examination: Secondary | ICD-10-CM | POA: Diagnosis not present

## 2022-08-26 DIAGNOSIS — K861 Other chronic pancreatitis: Secondary | ICD-10-CM | POA: Insufficient documentation

## 2022-08-26 DIAGNOSIS — Z85828 Personal history of other malignant neoplasm of skin: Secondary | ICD-10-CM | POA: Insufficient documentation

## 2022-08-26 DIAGNOSIS — Q870 Congenital malformation syndromes predominantly affecting facial appearance: Secondary | ICD-10-CM | POA: Insufficient documentation

## 2022-08-26 DIAGNOSIS — C44222 Squamous cell carcinoma of skin of right ear and external auricular canal: Secondary | ICD-10-CM | POA: Insufficient documentation

## 2022-08-26 DIAGNOSIS — I700 Atherosclerosis of aorta: Secondary | ICD-10-CM | POA: Insufficient documentation

## 2022-08-26 DIAGNOSIS — K5730 Diverticulosis of large intestine without perforation or abscess without bleeding: Secondary | ICD-10-CM | POA: Insufficient documentation

## 2022-08-26 DIAGNOSIS — Z7951 Long term (current) use of inhaled steroids: Secondary | ICD-10-CM | POA: Insufficient documentation

## 2022-08-26 DIAGNOSIS — Z79899 Other long term (current) drug therapy: Secondary | ICD-10-CM | POA: Insufficient documentation

## 2022-08-26 DIAGNOSIS — G8929 Other chronic pain: Secondary | ICD-10-CM | POA: Diagnosis not present

## 2022-08-26 DIAGNOSIS — J449 Chronic obstructive pulmonary disease, unspecified: Secondary | ICD-10-CM | POA: Diagnosis not present

## 2022-08-26 DIAGNOSIS — Z87891 Personal history of nicotine dependence: Secondary | ICD-10-CM | POA: Insufficient documentation

## 2022-08-26 HISTORY — DX: Chronic obstructive pulmonary disease, unspecified: J44.9

## 2022-08-26 NOTE — Progress Notes (Signed)
PCP - Valarie Merino, NP   Chest x-ray - 06/09/22 EKG - 06/09/22 ECHO - 06/10/22   Aspirin Instructions: instructed pt to call surgeon's office to get instructions   Anesthesia review: Yes, hypertensive crisis in January. Pt states he has run out of his blood pressure pills because his doctor had left/retired.   Patient denies shortness of breath, fever, cough and chest pain at PAT appointment   All instructions explained to the patient, with a verbal understanding of the material. Patient agrees to go over the instructions while at home for a better understanding. Patient also instructed to self quarantine after being tested for COVID-19. The opportunity to ask questions was provided.

## 2022-08-26 NOTE — Pre-Procedure Instructions (Signed)
Surgical Instructions   Your procedure is scheduled on August 28, 2022.  Report to [facility] Main Entrance "A" at 9:30 AM, then check in with the Admitting office.  Call this number if you have problems the morning of surgery:  [phone]  If you have any questions prior to your surgery date call [phone]: Open Monday-Friday 8am-4pm If you experience any cold or flu symptoms such as cough, fever, chills, shortness of breath, etc. between now and your scheduled surgery, please notify us at the above number.    Remember:  Do not eat after midnight the night before your surgery  You may drink clear liquids until 8:30 AM the morning of your surgery.   Clear liquids allowed are: Water, Non-Citrus Juices (without pulp), Carbonated Beverages, Clear Tea, Black Coffee Only (NO MILK, CREAM OR POWDERED CREAMER of any kind), and Gatorade.    Take these medicines the morning of surgery with A SIP OF WATER:  tamsulosin (FLOMAX)   albuterol (VENTOLIN HFA) inhaler - may take if needed (bring day of surgery)   Follow your surgeon's instructions on when to stop Aspirin.  If no instructions were given by your surgeon then you will need to call the office to get those instructions.    As of today, STOP taking any Aleve, Naproxen, Ibuprofen, Motrin, Advil, Goody's, BC's, all herbal medications, fish oil, and all vitamins.                 Do NOT Smoke (Tobacco/Vaping) for 24 hours prior to your procedure.   Contacts, glasses, piercing's, hearing aid's, dentures or partials may not be worn into surgery, please bring cases for these belongings.      Patients discharged the day of surgery will not be allowed to drive home, and someone needs to stay with them for 24 hours.  SURGICAL WAITING ROOM VISITATION Patients having surgery or a procedure may have no more than 2 support people in the waiting area - these visitors may rotate.   Children under the age of 38 must have an adult with them who is not  the patient. Pre-op nurse will coordinate an appropriate time for 1 support person to accompany patient in pre-op.  This support person may not rotate.   Special instructions:   [place]- Preparing For Surgery  Before surgery, you can play an important role. Because skin is not sterile, your skin needs to be as free of germs as possible. You can reduce the number of germs on your skin by washing with CHG (chlorahexidine gluconate) Soap before surgery.  CHG is an antiseptic cleaner which kills germs and bonds with the skin to continue killing germs even after washing.    Oral Hygiene is also important to reduce your risk of infection.  Remember - BRUSH YOUR TEETH THE MORNING OF SURGERY WITH YOUR REGULAR TOOTHPASTE  Please do not use if you have an allergy to CHG or antibacterial soaps. If your skin becomes reddened/irritated stop using the CHG.  Do not shave (including legs and underarms) for at least 48 hours prior to first CHG shower. It is OK to shave your face.  Please follow these instructions carefully.   Shower the NIGHT BEFORE SURGERY and the MORNING OF SURGERY  If you chose to wash your hair, wash your hair first as usual with your normal shampoo.  After you shampoo, rinse your hair and body thoroughly to remove the shampoo.  Use CHG Soap as you would any other liquid soap. You  can apply CHG directly to the skin and wash gently with a scrungie or a clean washcloth.   Apply the CHG Soap to your body ONLY FROM THE NECK DOWN.  Do not use on open wounds or open sores. Avoid contact with your eyes, ears, mouth and genitals (private parts). Wash Face and genitals (private parts)  with your normal soap.   Wash thoroughly, paying special attention to the area where your surgery will be performed.  Thoroughly rinse your body with warm water from the neck down.  DO NOT shower/wash with your normal soap after using and rinsing off the CHG Soap.  Pat yourself dry with a CLEAN  TOWEL.  Wear CLEAN PAJAMAS to bed the night before surgery  Place CLEAN SHEETS on your bed the night before your surgery  DO NOT SLEEP WITH PETS.  Day of Surgery: Take a shower with CHG soap. Do not wear jewelry. Do not wear lotions, powders, cologne or deodorant. Do not shave 48 hours prior to surgery.  Men may shave face and neck. Do not bring valuables to the hospital.  [facility] is not responsible for any belongings or valuables.  Wear Clean/Comfortable clothing the morning of surgery Remember to brush your teeth WITH YOUR REGULAR TOOTHPASTE.   Please read over the fact sheets that you were given.

## 2022-08-27 NOTE — Progress Notes (Signed)
Anesthesia Chart Review:   Case: [id number] Date/Time: 08/28/22 1115   Procedures:      EXCISION OF EAR WOUND (Right)     ADJACENT TISSUE TRANFER; POSSIBLESKIN GRAFT FROM NECK TO EAR; POSSIBLE INTEGRA SYNTHETIC GRAFTING (Right)   Anesthesia type: General   Pre-op diagnosis: Right Ear: Squamous cell carcinoma; Acquired deformity; Mohs defect   Location: [facility] OR ROOM 05 / [place] [facility]   Surgeons: Jenetta Downer, MD       DISCUSSION: Patient is a 67 year old male scheduled for the above procedure.  History includes smoking, HTN, COPD, skin cancer (SCC), chronic pain, polysubstance abuse. Findings of chronic pancreatitis and possible renal infarct ("likely subacute") on 06/09/22 CT.   [place] admission 06/09/22-06/11/22 for hypertensive urgency with intractable N/V. He had run out of medication after his PCP retired. UDS + amphetamines and opiates.  CTA of the chest/abd/pelvis suggestive of chronic pancreatitis and incidental finding of wedge-shaped hypodensity in the inferior right kidney, felt likely subacute renal infarct. No evidence of aneurysm, dissection, vasculitis, or significant stenosis of renals, SMA, celiac, aorta on CTA. 06/10/22 echo showed no evidence or thrombus, EF 55-60%, no regional wall motion abnormalities, mild LVH of the basal-septal segment, grade 1 diastolic dysfunction, normal RVSF, no significant valvular disease. Consider further out-patient evaluation, but for now ASA recommended. He briefly required Cardene infusion, but HTN improved after resuming home medications.   He had labs on 08/09/22 at PCP visit with Evangeline Gula, NP to establish care.  He had a cough about 5 days prior to that visit. He reported symptoms short lived and now better. He denied SOB, cough, fever, chest pain.    He is on losartan-HCTZ 100-12.5 mg daily for HTN. BP 152/79 at PAT.   BP improved back on medication. He has known skin cancer with Mohs defect and needs above surgery. Anesthesia team to  evaluate on the day of surgery.    VS: BP (!) 152/79   Pulse (!) 57   Temp (!) 36.4 C (Oral)   Resp 18   Ht 5\' 11"  (1.803 m)   Wt 86.8 kg   SpO2 100%   BMI 26.69 kg/m    PROVIDERS: Evangeline Gula, NP is PCP    LABS: Most recent labs in [facility] include: Lab Results  Component Value Date   WBC 7.1 08/09/2022   HGB 14.2 08/09/2022   HCT 42.2 08/09/2022   PLT 205.0 08/09/2022   GLUCOSE 86 08/09/2022   ALT 29 08/09/2022   AST 34 08/09/2022   NA 137 08/09/2022   K 4.6 08/09/2022   CL 101 08/09/2022   CREATININE 0.97 08/09/2022   BUN 11 08/09/2022   CO2 30 08/09/2022   HGBA1C 6.1 10/03/2021    IMAGES: CTA  Chest/abd/pelvis 06/09/22: IMPRESSION: 1. No evidence for aortic dissection or aneurysm. 2. Wedge-shaped hypodensity in the inferior right kidney worrisome for renal infarct. Infection would be in the differential. 3. Findings compatible with chronic pancreatitis. 4. Colonic diverticulosis. Aortic Atherosclerosis (ICD10-I70.0).   CT Head 06/09/22: IMPRESSION: Atrophy and chronic small vessel ischemic changes. No acute intracranial process identified.   EKG: 06/09/22: Sinus tachycardia at 103 bpm Right atrial enlargement Left anterior fascicular block Low voltage, precordial leads Baseline wander in lead(s) II Abnormal ECG Confirmed by Carmin Muskrat [id number]) on 06/09/2022 3:38:16 PM   CV: Echo 06/10/22: IMPRESSIONS   1. Left ventricular ejection fraction, by estimation, is 55 to 60%. The  left ventricle has normal function. The left ventricle has  no regional  wall motion abnormalities. There is mild left ventricular hypertrophy of  the basal-septal segment. Left  ventricular diastolic parameters are consistent with Grade I diastolic  dysfunction (impaired relaxation).   2. Right ventricular systolic function is normal. The right ventricular  size is normal.   3. The mitral valve is grossly normal. No evidence of mitral valve  regurgitation. No evidence  of mitral stenosis.   4. The aortic valve is tricuspid. Aortic valve regurgitation is not  visualized. No aortic stenosis is present.   5. The inferior vena cava is normal in size with greater than 50%  respiratory variability, suggesting right atrial pressure of 3 mmHg.    Past Medical History:  Diagnosis Date   Arthritis    Cancer ([place])    squamous cell "all in my face arms and hands"   Chronic pain    COPD (chronic obstructive pulmonary disease) ([place])    Depression    Hypertension    Renal infarct ([place]) 06/10/2022   Skin cancer     Past Surgical History:  Procedure Laterality Date   cartilage removed from both knees     KNEE ARTHROSCOPY     left wrist     SKIN CANCER EXCISION      MEDICATIONS:  albuterol (VENTOLIN HFA) 108 (90 Base) MCG/ACT inhaler   aspirin 81 MG chewable tablet   benzonatate (TESSALON) 100 MG capsule   losartan-hydrochlorothiazide (HYZAAR) 100-12.5 MG tablet   Multiple Vitamins-Minerals (MULTIVITAMIN WITH MINERALS) tablet   tamsulosin (FLOMAX) 0.4 MG CAPS capsule   No current facility-administered medications for this encounter.  He is not currently taking Tessalon.    Myra Gianotti, PA-C Surgical Short Stay/Anesthesiology [facility] Phone [phone] [facility] Phone [phone] 08/27/2022 11:43 AM

## 2022-08-27 NOTE — Anesthesia Preprocedure Evaluation (Signed)
Anesthesia Evaluation  Patient identified by MRN, date of birth, ID band Patient awake    Reviewed: Allergy & Precautions, NPO status , Patient's Chart, lab work & pertinent test results  Airway Mallampati: II  TM Distance: >3 FB Neck ROM: Full    Dental  (+) Edentulous Lower, Dental Advisory Given, Edentulous Upper   Pulmonary COPD, neg recent URI, Current Smoker   Pulmonary exam normal breath sounds clear to auscultation       Cardiovascular hypertension, Pt. on medications  Rhythm:Regular Rate:Tachycardia  Echo 06/2022  1. Left ventricular ejection fraction, by estimation, is 55 to 60%. The left ventricle has normal function. The left ventricle has no regional wall motion abnormalities. There is mild left ventricular hypertrophy of the basal-septal segment. Left ventricular diastolic parameters are consistent with Grade I diastolic dysfunction (impaired relaxation).   2. Right ventricular systolic function is normal. The right ventricular size is normal.   3. The mitral valve is grossly normal. No evidence of mitral valve regurgitation. No evidence of mitral stenosis.   4. The aortic valve is tricuspid. Aortic valve regurgitation is not visualized. No aortic stenosis is present.   5. The inferior vena cava is normal in size with greater than 50% respiratory variability, suggesting right atrial pressure of 3 mmHg.     Neuro/Psych  PSYCHIATRIC DISORDERS  Depression    negative neurological ROS     GI/Hepatic negative GI ROS,,,(+)     substance abuse    Endo/Other  negative endocrine ROS    Renal/GU negative Renal ROS     Musculoskeletal  (+) Arthritis ,    Abdominal   Peds  Hematology negative hematology ROS (+)   Anesthesia Other Findings   Reproductive/Obstetrics                              Anesthesia Physical Anesthesia Plan  ASA: 3  Anesthesia Plan: General   Post-op Pain  Management: Tylenol PO (pre-op)*   Induction: Intravenous  PONV Risk Score and Plan: 2 and Ondansetron, Dexamethasone, Treatment may vary due to age or medical condition and Midazolam  Airway Management Planned: Oral ETT  Additional Equipment:   Intra-op Plan:   Post-operative Plan: Extubation in OR  Informed Consent: I have reviewed the patients History and Physical, chart, labs and discussed the procedure including the risks, benefits and alternatives for the proposed anesthesia with the patient or authorized representative who has indicated his/her understanding and acceptance.     Dental advisory given  Plan Discussed with: CRNA  Anesthesia Plan Comments: (PAT note written 08/27/2022 by Myra Gianotti, PA-C.  )        Anesthesia Quick Evaluation

## 2022-08-28 ENCOUNTER — Encounter (HOSPITAL_COMMUNITY): Payer: Self-pay | Admitting: Otolaryngology

## 2022-08-28 ENCOUNTER — Other Ambulatory Visit (HOSPITAL_COMMUNITY): Payer: Self-pay | Admitting: Otolaryngology

## 2022-08-28 ENCOUNTER — Ambulatory Visit (HOSPITAL_COMMUNITY): Payer: Medicare PPO | Admitting: Vascular Surgery

## 2022-08-28 ENCOUNTER — Other Ambulatory Visit: Payer: Self-pay

## 2022-08-28 ENCOUNTER — Ambulatory Visit (HOSPITAL_COMMUNITY)
Admission: RE | Admit: 2022-08-28 | Discharge: 2022-08-28 | Disposition: A | Discharge status: Home / Self Care | Payer: Medicare PPO | Attending: Otolaryngology | Admitting: Otolaryngology

## 2022-08-28 ENCOUNTER — Encounter (HOSPITAL_COMMUNITY)
Admission: RE | Disposition: A | Discharge status: Home / Self Care | Payer: Self-pay | Source: Home / Self Care | Attending: Otolaryngology

## 2022-08-28 ENCOUNTER — Ambulatory Visit (HOSPITAL_BASED_OUTPATIENT_CLINIC_OR_DEPARTMENT_OTHER): Payer: Medicare PPO | Admitting: Anesthesiology

## 2022-08-28 DIAGNOSIS — J449 Chronic obstructive pulmonary disease, unspecified: Secondary | ICD-10-CM

## 2022-08-28 DIAGNOSIS — M952 Other acquired deformity of head: Secondary | ICD-10-CM

## 2022-08-28 DIAGNOSIS — C44222 Squamous cell carcinoma of skin of right ear and external auricular canal: Secondary | ICD-10-CM | POA: Diagnosis not present

## 2022-08-28 DIAGNOSIS — Z01818 Encounter for other preprocedural examination: Secondary | ICD-10-CM

## 2022-08-28 DIAGNOSIS — I10 Essential (primary) hypertension: Secondary | ICD-10-CM | POA: Insufficient documentation

## 2022-08-28 DIAGNOSIS — F32A Depression, unspecified: Secondary | ICD-10-CM | POA: Insufficient documentation

## 2022-08-28 DIAGNOSIS — F17210 Nicotine dependence, cigarettes, uncomplicated: Secondary | ICD-10-CM

## 2022-08-28 DIAGNOSIS — Z79899 Other long term (current) drug therapy: Secondary | ICD-10-CM | POA: Diagnosis not present

## 2022-08-28 DIAGNOSIS — H61111 Acquired deformity of pinna, right ear: Secondary | ICD-10-CM | POA: Diagnosis not present

## 2022-08-28 DIAGNOSIS — G8929 Other chronic pain: Secondary | ICD-10-CM | POA: Diagnosis not present

## 2022-08-28 DIAGNOSIS — C44229 Squamous cell carcinoma of skin of left ear and external auricular canal: Secondary | ICD-10-CM

## 2022-08-28 DIAGNOSIS — M1990 Unspecified osteoarthritis, unspecified site: Secondary | ICD-10-CM | POA: Diagnosis not present

## 2022-08-28 HISTORY — PX: SKIN FULL THICKNESS GRAFT: SHX442

## 2022-08-28 HISTORY — PX: EXCISION MASS HEAD: SHX6702

## 2022-08-28 SURGERY — EXCISION, MASS, HEAD
Anesthesia: General | Site: Ear | Laterality: Right

## 2022-08-28 MED ORDER — PROPOFOL 10 MG/ML IV BOLUS
INTRAVENOUS | Status: AC
  Filled 2022-08-28: qty 20

## 2022-08-28 MED ORDER — OXYCODONE HCL 5 MG PO TABS
5.0000 mg | ORAL_TABLET | Freq: Once | ORAL | Status: AC | PRN
  Administered 2022-08-28: 5 mg via ORAL

## 2022-08-28 MED ORDER — HYDROMORPHONE HCL 1 MG/ML IJ SOLN
INTRAMUSCULAR | Status: AC
  Filled 2022-08-28: qty 0.5

## 2022-08-28 MED ORDER — OXYCODONE HCL 5 MG/5ML PO SOLN: 5.0000 mg | Freq: Once | ORAL | Status: AC | PRN

## 2022-08-28 MED ORDER — MIDAZOLAM HCL 2 MG/2ML IJ SOLN
INTRAMUSCULAR | Status: AC
  Filled 2022-08-28: qty 2

## 2022-08-28 MED ORDER — ACETAMINOPHEN 10 MG/ML IV SOLN
INTRAVENOUS | Status: DC | PRN
  Administered 2022-08-28: 1000 mg via INTRAVENOUS

## 2022-08-28 MED ORDER — BACITRACIN ZINC 500 UNIT/GM EX OINT
TOPICAL_OINTMENT | CUTANEOUS | Status: AC
  Filled 2022-08-28: qty 28.35

## 2022-08-28 MED ORDER — ONDANSETRON HCL 4 MG/2ML IJ SOLN
INTRAMUSCULAR | Status: DC | PRN
  Administered 2022-08-28: 4 mg via INTRAVENOUS

## 2022-08-28 MED ORDER — FENTANYL CITRATE (PF) 100 MCG/2ML IJ SOLN
INTRAMUSCULAR | Status: DC | PRN
  Administered 2022-08-28 (×5): 50 ug via INTRAVENOUS

## 2022-08-28 MED ORDER — FENTANYL CITRATE (PF) 250 MCG/5ML IJ SOLN
INTRAMUSCULAR | Status: AC
  Filled 2022-08-28: qty 5

## 2022-08-28 MED ORDER — AMISULPRIDE (ANTIEMETIC) 5 MG/2ML IV SOLN: 10.0000 mg | Freq: Once | INTRAVENOUS | Status: DC | PRN

## 2022-08-28 MED ORDER — CIPROFLOXACIN-DEXAMETHASONE 0.3-0.1 % OT SUSP
OTIC | Status: DC | PRN
  Administered 2022-08-28: 4 [drp] via OTIC

## 2022-08-28 MED ORDER — LIDOCAINE-EPINEPHRINE 1 %-1:100000 IJ SOLN
INTRAMUSCULAR | Status: AC
  Filled 2022-08-28: qty 1

## 2022-08-28 MED ORDER — ESMOLOL HCL 100 MG/10ML IV SOLN
INTRAVENOUS | Status: DC | PRN
  Administered 2022-08-28: 20 mg via INTRAVENOUS

## 2022-08-28 MED ORDER — HYDROMORPHONE HCL 1 MG/ML IJ SOLN
INTRAMUSCULAR | Status: DC | PRN
  Administered 2022-08-28: .5 mg via INTRAVENOUS

## 2022-08-28 MED ORDER — 0.9 % SODIUM CHLORIDE (POUR BTL) OPTIME
TOPICAL | Status: DC | PRN
  Administered 2022-08-28: 1000 mL

## 2022-08-28 MED ORDER — OXYCODONE HCL 5 MG PO TABS: 5.0000 mg | ORAL_TABLET | Freq: Four times a day (QID) | ORAL | 0 refills | Status: DC | PRN

## 2022-08-28 MED ORDER — LIDOCAINE 2% (20 MG/ML) 5 ML SYRINGE
INTRAMUSCULAR | Status: DC | PRN
  Administered 2022-08-28: 80 mg via INTRAVENOUS

## 2022-08-28 MED ORDER — EPHEDRINE SULFATE-NACL 50-0.9 MG/10ML-% IV SOSY
PREFILLED_SYRINGE | INTRAVENOUS | Status: DC | PRN
  Administered 2022-08-28: 5 mg via INTRAVENOUS
  Administered 2022-08-28 (×2): 10 mg via INTRAVENOUS

## 2022-08-28 MED ORDER — DEXAMETHASONE SODIUM PHOSPHATE 10 MG/ML IJ SOLN
INTRAMUSCULAR | Status: DC | PRN
  Administered 2022-08-28: 5 mg via INTRAVENOUS

## 2022-08-28 MED ORDER — MIDAZOLAM HCL 5 MG/5ML IJ SOLN
INTRAMUSCULAR | Status: DC | PRN
  Administered 2022-08-28: 1 mg via INTRAVENOUS

## 2022-08-28 MED ORDER — SUGAMMADEX SODIUM 200 MG/2ML IV SOLN
INTRAVENOUS | Status: DC | PRN
  Administered 2022-08-28: 100 mg via INTRAVENOUS

## 2022-08-28 MED ORDER — PROMETHAZINE HCL 25 MG/ML IJ SOLN: 6.2500 mg | INTRAMUSCULAR | Status: DC | PRN

## 2022-08-28 MED ORDER — ROCURONIUM BROMIDE 100 MG/10ML IV SOLN
INTRAVENOUS | Status: DC | PRN
  Administered 2022-08-28: 20 mg via INTRAVENOUS
  Administered 2022-08-28: 50 mg via INTRAVENOUS
  Administered 2022-08-28 (×2): 10 mg via INTRAVENOUS

## 2022-08-28 MED ORDER — TRIAMCINOLONE ACETONIDE 40 MG/ML IJ SUSP
INTRAMUSCULAR | Status: AC
  Filled 2022-08-28: qty 5

## 2022-08-28 MED ORDER — LIDOCAINE-EPINEPHRINE 1 %-1:100000 IJ SOLN
INTRAMUSCULAR | Status: DC | PRN
  Administered 2022-08-28: 20 mL

## 2022-08-28 MED ORDER — CIPROFLOXACIN-DEXAMETHASONE 0.3-0.1 % OT SUSP
OTIC | Status: AC
  Filled 2022-08-28: qty 7.5

## 2022-08-28 MED ORDER — LIDOCAINE 2% (20 MG/ML) 5 ML SYRINGE
INTRAMUSCULAR | Status: AC
  Filled 2022-08-28: qty 5

## 2022-08-28 MED ORDER — ACETAMINOPHEN 500 MG PO TABS: 500.0000 mg | ORAL_TABLET | Freq: Four times a day (QID) | ORAL | 0 refills | Status: AC | PRN

## 2022-08-28 MED ORDER — MEPERIDINE HCL 25 MG/ML IJ SOLN: 6.2500 mg | INTRAMUSCULAR | Status: DC | PRN

## 2022-08-28 MED ORDER — ROCURONIUM BROMIDE 10 MG/ML (PF) SYRINGE
PREFILLED_SYRINGE | INTRAVENOUS | Status: AC
  Filled 2022-08-28: qty 10

## 2022-08-28 MED ORDER — CHLORHEXIDINE GLUCONATE 0.12 % MT SOLN
15.0000 mL | Freq: Once | OROMUCOSAL | Status: AC
  Administered 2022-08-28: 15 mL via OROMUCOSAL
  Filled 2022-08-28: qty 15

## 2022-08-28 MED ORDER — METOPROLOL TARTRATE 5 MG/5ML IV SOLN
INTRAVENOUS | Status: DC | PRN
  Administered 2022-08-28: 1.5 mg via INTRAVENOUS
  Administered 2022-08-28: 1 mg via INTRAVENOUS
  Administered 2022-08-28: 2.5 mg via INTRAVENOUS

## 2022-08-28 MED ORDER — ESMOLOL HCL 100 MG/10ML IV SOLN
INTRAVENOUS | Status: AC
  Filled 2022-08-28: qty 10

## 2022-08-28 MED ORDER — CHLORHEXIDINE GLUCONATE 0.12 % MT SOLN: 15.0000 mL | Freq: Once | OROMUCOSAL | Status: DC

## 2022-08-28 MED ORDER — OXYCODONE HCL 5 MG PO TABS
ORAL_TABLET | ORAL | Status: AC
  Filled 2022-08-28: qty 1

## 2022-08-28 MED ORDER — FENTANYL CITRATE (PF) 100 MCG/2ML IJ SOLN: 25.0000 ug | INTRAMUSCULAR | Status: DC | PRN

## 2022-08-28 MED ORDER — LABETALOL HCL 5 MG/ML IV SOLN
INTRAVENOUS | Status: DC | PRN
  Administered 2022-08-28: 5 mg via INTRAVENOUS

## 2022-08-28 MED ORDER — CEFAZOLIN SODIUM-DEXTROSE 2-4 GM/100ML-% IV SOLN
2.0000 g | INTRAVENOUS | Status: AC
  Administered 2022-08-28: 2 g via INTRAVENOUS
  Filled 2022-08-28: qty 100

## 2022-08-28 MED ORDER — ACETAMINOPHEN 10 MG/ML IV SOLN
INTRAVENOUS | Status: AC
  Filled 2022-08-28: qty 100

## 2022-08-28 MED ORDER — LACTATED RINGERS IV SOLN: INTRAVENOUS | Status: DC

## 2022-08-28 MED ORDER — DEXMEDETOMIDINE HCL IN NACL 80 MCG/20ML IV SOLN
INTRAVENOUS | Status: DC | PRN
  Administered 2022-08-28: 8 ug via BUCCAL

## 2022-08-28 MED ORDER — BACITRACIN ZINC 500 UNIT/GM EX OINT
TOPICAL_OINTMENT | CUTANEOUS | Status: DC | PRN
  Administered 2022-08-28: 1 via TOPICAL

## 2022-08-28 MED ORDER — GLYCOPYRROLATE 0.2 MG/ML IJ SOLN
INTRAMUSCULAR | Status: DC | PRN
  Administered 2022-08-28: .2 mg via INTRAVENOUS

## 2022-08-28 MED ORDER — ORAL CARE MOUTH RINSE: 15.0000 mL | Freq: Once | OROMUCOSAL | Status: DC

## 2022-08-28 MED ORDER — PROPOFOL 10 MG/ML IV BOLUS
INTRAVENOUS | Status: DC | PRN
  Administered 2022-08-28: 30 mg via INTRAVENOUS
  Administered 2022-08-28: 110 mg via INTRAVENOUS
  Administered 2022-08-28: 40 mg via INTRAVENOUS
  Administered 2022-08-28: 30 mg via INTRAVENOUS

## 2022-08-28 MED ORDER — ORAL CARE MOUTH RINSE: 15.0000 mL | Freq: Once | OROMUCOSAL | Status: AC

## 2022-08-28 MED ORDER — BUPIVACAINE HCL (PF) 0.25 % IJ SOLN
INTRAMUSCULAR | Status: AC
  Filled 2022-08-28: qty 30

## 2022-08-28 SURGICAL SUPPLY — 60 items
ADH SKN CLS APL DERMABOND .7 (GAUZE/BANDAGES/DRESSINGS)
APL SRG 3 HI ABS STRL LF PLS (MISCELLANEOUS) ×2
APPLICATOR DR MATTHEWS STRL (MISCELLANEOUS) IMPLANT
BAG COUNTER SPONGE SURGICOUNT (BAG) ×2 IMPLANT
BAG SPNG CNTER NS LX DISP (BAG) ×2
BLADE SURG 15 STRL LF DISP TIS (BLADE) IMPLANT
BLADE SURG 15 STRL SS (BLADE) ×4
CANISTER SUCT 3000ML PPV (MISCELLANEOUS) ×2 IMPLANT
CLEANER TIP ELECTROSURG 2X2 (MISCELLANEOUS) ×2 IMPLANT
CORD BIPOLAR FORCEPS 12FT (ELECTRODE) IMPLANT
COVER SURGICAL LIGHT HANDLE (MISCELLANEOUS) ×2 IMPLANT
DERMABOND ADVANCED .7 DNX12 (GAUZE/BANDAGES/DRESSINGS) ×2 IMPLANT
DRAPE HALF SHEET 40X57 (DRAPES) IMPLANT
DRESSING NASAL POPE 10X1.5X2.5 (GAUZE/BANDAGES/DRESSINGS) IMPLANT
DRSG GLASSCOCK MASTOID ADT (GAUZE/BANDAGES/DRESSINGS) IMPLANT
DRSG NASAL POPE 10X1.5X2.5 (GAUZE/BANDAGES/DRESSINGS) ×2
DRSG TELFA 3X8 NADH STRL (GAUZE/BANDAGES/DRESSINGS) IMPLANT
ELECT COATED BLADE 2.86 ST (ELECTRODE) ×2 IMPLANT
ELECT REM PT RETURN 9FT ADLT (ELECTROSURGICAL) ×2
ELECTRODE REM PT RTRN 9FT ADLT (ELECTROSURGICAL) IMPLANT
FORCEPS BIPOLAR SPETZLER 8 1.0 (NEUROSURGERY SUPPLIES) IMPLANT
GAUZE 4X4 16PLY ~~LOC~~+RFID DBL (SPONGE) ×2 IMPLANT
GAUZE XEROFORM 1X8 LF (GAUZE/BANDAGES/DRESSINGS) ×2 IMPLANT
GAUZE XEROFORM 5X9 LF (GAUZE/BANDAGES/DRESSINGS) IMPLANT
GLOVE BIO SURGEON STRL SZ7.5 (GLOVE) ×2 IMPLANT
GLOVE BIOGEL PI IND STRL 8 (GLOVE) ×2 IMPLANT
GOWN STRL REUS W/ TWL LRG LVL3 (GOWN DISPOSABLE) ×2 IMPLANT
GOWN STRL REUS W/ TWL XL LVL3 (GOWN DISPOSABLE) ×2 IMPLANT
GOWN STRL REUS W/TWL LRG LVL3 (GOWN DISPOSABLE) ×2
GOWN STRL REUS W/TWL XL LVL3 (GOWN DISPOSABLE) ×2
KIT BASIN OR (CUSTOM PROCEDURE TRAY) ×2 IMPLANT
KIT TURNOVER KIT B (KITS) ×2 IMPLANT
MARKER SKIN DUAL TIP RULER LAB (MISCELLANEOUS) IMPLANT
NDL 27GX1/2 REG BEVEL ECLIP (NEEDLE) ×2 IMPLANT
NDL HYPO 30X.5 LL (NEEDLE) ×2 IMPLANT
NDL PRECISIONGLIDE 27X1.5 (NEEDLE) IMPLANT
NEEDLE 27GX1/2 REG BEVEL ECLIP (NEEDLE) ×2 IMPLANT
NEEDLE HYPO 30X.5 LL (NEEDLE) ×2 IMPLANT
NEEDLE PRECISIONGLIDE 27X1.5 (NEEDLE) IMPLANT
NS IRRIG 1000ML POUR BTL (IV SOLUTION) ×2 IMPLANT
PAD ARMBOARD 7.5X6 YLW CONV (MISCELLANEOUS) ×4 IMPLANT
PENCIL SMOKE EVACUATOR (MISCELLANEOUS) ×2 IMPLANT
STAPLER VISISTAT 35W (STAPLE) ×2 IMPLANT
SUT CHROMIC 4 0 PS 2 18 (SUTURE) IMPLANT
SUT CHROMIC 5 0 P 3 (SUTURE) IMPLANT
SUT ETHILON 5 0 PS 2 18 (SUTURE) IMPLANT
SUT MON AB 5-0 PS2 18 (SUTURE) IMPLANT
SUT PERMA SILK 0 CT1 (SUTURE) IMPLANT
SUT PLAIN 5 0 P 3 18 (SUTURE) IMPLANT
SUT VIC AB 3-0 SH 27 (SUTURE)
SUT VIC AB 3-0 SH 27XBRD (SUTURE) IMPLANT
SUT VIC AB 3-0 X1 27 (SUTURE) IMPLANT
SUT VIC AB 4-0 PS2 18 (SUTURE) IMPLANT
SUT VIC AB 4-0 RB1 18 (SUTURE) IMPLANT
SUT VICRYL 4-0 PS2 18IN ABS (SUTURE) IMPLANT
SYR 5ML LL (SYRINGE) IMPLANT
SYR CONTROL 10ML LL (SYRINGE) ×2 IMPLANT
TOWEL GREEN STERILE (TOWEL DISPOSABLE) ×2 IMPLANT
TRAY ENT MC OR (CUSTOM PROCEDURE TRAY) ×2 IMPLANT
TUBE CONNECTING 12X1/4 (SUCTIONS) ×2 IMPLANT

## 2022-08-28 NOTE — Discharge Instructions (Signed)
Post-operative Patient Instructions Don Malay G. Sherria Riemann MD  Surgery What to expect: A bandage will be placed on your surgical sites. You can leave the bandages in place until you return to the clinic. You may be scheduled for a series of wound care appointments over the next month.  Recovery/Restrictions: -No strenuous activity for at least the first week after your procedure -Bruising and swelling are expected and will take weeks to go away (consider using ice packs) -Please contact our office immediately if you experience any signs/symptoms of infection (redness, pain, or fever of 100.4F or greater)  Wound Wound care: The goal is to keep your wounds clean and moist to prevent scabs or crusts. You will keep your current post-operative dressing in place undisturbed until after your first post-operative clinic visit. The following instructions apply after your first post-operative visit.  1. Clean wound wounds with soap and water using a cue tip if any crusts are present  2. Next, apply a thin layer of Aquaphor ointment 3. Apply Telfa and cover with brown tape  4. Please apply a thin layer of antibiotic ointment or Aquaphor ointment to your ear incision every day  Care Healing Period: For the best healing, please protect the area from the sun   You may be asked to begin massaging the scars several weeks after surgery. Scars can be massaged in horizontal, vertical, and circular motions.   Adult Post-Operative Pain Management  Pain medication is given immediately following your surgery to help with post-operative pain. Do not wake up or set an alarm to wake up and take pain medications. Sleep and rest.  Upon your discharge home, we suggest scheduled doses of Acetaminophen (Tylenol) every 6 hours and Ibuprofen (Motrin) every 6 hours, alternating between medications every 3 hours (i.e. Take Tylenol and wait 3 hours, then take Motrin and wait 3 hours, repeat) for the first 3-4 days after  surgery. If you are without significant pain, medications can be taken more infrequently. It is important to follow dosing instructions on the medication bottle or prescription.   Sample of medication dosing schedule  Give dose of: Time: Given:  Acetaminophen 12 a.m.   Ibuprofen 3 a.m.   Acetaminophen 6 a.m.   Ibuprofen 9 a.m.   Acetaminophen 12 p.m.   Ibuprofen 3 p.m.   Acetaminophen 6 p.m.   Ibuprofen 9 p.m.    If you need to call after clinic hours for a concern, call [phone] and ask for the "physician on call for ENT."  [address] [place], [place] [address] Phone: [phone]   

## 2022-08-28 NOTE — Transfer of Care (Signed)
Immediate Anesthesia Transfer of Care Note  Patient: Don [name]  Procedure(s) Performed: EXCISION OF EAR WOUND (Right) RIGHT EXCISION OF EAR WOUND, ADJACENT TISSUE TRANSFER, RIGHT SKIN GRAFT FROM NECK TO EAR (Right: Ear)  Patient Location: PACU  Anesthesia Type:General  Level of Consciousness: awake, alert , and oriented  Airway & Oxygen Therapy: Patient Spontanous Breathing and Patient connected to nasal cannula oxygen  Post-op Assessment: Report given to RN and Post -op Vital signs reviewed and stable  Post vital signs: Reviewed and stable  Last Vitals:  Vitals Value Taken Time  BP 140/79 08/28/22 1515  Temp 36.9 C 08/28/22 1510  Pulse 94 08/28/22 1523  Resp 15 08/28/22 1523  SpO2 94 % 08/28/22 1523  Vitals shown include unvalidated device data.  Last Pain:  Vitals:   08/28/22 1510  TempSrc:   PainSc: 0-No pain      Patients Stated Pain Goal: 0 ([phone]  Complications: No notable events documented.

## 2022-08-28 NOTE — Brief Op Note (Signed)
08/28/2022  3:10 PM  PATIENT:  Don [name]  67 y.o. male  PRE-OPERATIVE DIAGNOSIS:  Right Ear: Squamous cell carcinoma; Acquired deformity; Mohs defect  POST-OPERATIVE DIAGNOSIS:  Right Ear: Squamous cell carcinoma; Acquired deformity; Mohs defect  PROCEDURE:  Procedure(s): EXCISION OF EAR WOUND (Right) RIGHT EXCISION OF EAR WOUND, ADJACENT TISSUE TRANSFER, RIGHT SKIN GRAFT FROM NECK TO EAR (Right)  SURGEON:  Surgeon(s) and Role:    * Jenetta Downer, MD - Primary  PHYSICIAN ASSISTANT:   ASSISTANTS: none   ANESTHESIA:   general  EBL:  100 mL   BLOOD ADMINISTERED:none  DRAINS: none   LOCAL MEDICATIONS USED:  LIDOCAINE   SPECIMEN:  No Specimen  DISPOSITION OF SPECIMEN:  N/A  COUNTS:  YES  TOURNIQUET:  * No tourniquets in log *  DICTATION: .Note written in EPIC  PLAN OF CARE: Discharge to home after PACU  PATIENT DISPOSITION:  PACU - hemodynamically stable.   Delay start of Pharmacological VTE agent (>24hrs) due to surgical blood loss or risk of bleeding: not applicable  Electronically signed by:  Jenetta Downer, MD  Staff Physician Facial Plastic & Reconstructive Surgery Otolaryngology - Head and [place], [place]

## 2022-08-28 NOTE — Progress Notes (Signed)
Assessment completed at 1120

## 2022-08-28 NOTE — Op Note (Signed)
FACIAL PLASTIC SURGERY OPERATIVE NOTE  [facility] Date/Time of Admission: 08/28/2022  7:03 AM  CSN: [id number] Attending Provider: No att. providers found Room/Bed: MCPO/NONE DOB: [birth year] Age: 67 y.o.   Pre-Op Diagnosis: Right Ear: Squamous cell carcinoma; Acquired deformity; Mohs defect  Post-Op Diagnosis: Right Ear: Squamous cell carcinoma; Acquired deformity; Mohs defect  Procedure: Adjacent tissue transfer right cheek O-T advancement flaps 10x6cm (CPT 14041) Adjacent tissue transfer right ear with inferiorly based post-auricular transposition flap 6x2cm (CPT 14061) Full thickness skin graft harvest from right neck to ear 7x2cm (CPT 15240) Excision wound head <100cm2 in preparation for flaps and grafts (CPT 15004)  Anesthesia: General  Surgeon(s): Pamala Hurry, MD  Staff: Circulator: Patrcia Dolly, RN Relief Scrub: Candie Mile Scrub Person: Rowe Robert  Implants: * No implants in log *  Specimens: * No specimens in log *  Complications: none  EBL: 83 ML  IVF: Per anesthesia ML  Condition: stable  Operative Findings:  Large subtotal right auricular defect, full thickness with preservation of the auricular helix and portion of antihelix; defect extends to periosteum of temporal bone and parotid tissue, extending to 360 degree lateral ear canal.   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 Rayne Du yesterday 08/27/22. He presents now for definitive surgical management.  Informed consent obtained. Risks discussed in detail including pain, bleeding, infection, scarring, numbness, poor cosmesis, flap/skin  graft loss, ear canal stenosis, need for further surgery, risks of anesthesia. Despite these risks the patient requested to proceed with surgery.  Description of Operation:  The patient was identified in the preoperative area and consent confirmed in the chart.  He was brought to the operating room by the anesthetist and preoperative was performed confirming the patient's identity and procedure to be performed.  Once all were in agreement we will proceed with surgery.  General anesthesia was induced with a laryngeal mask airway.  The patient head was turned to the left exposing the right ear and neck with findings as noted above.  Patient was prepped and draped in sterile sterile fashion for procedure of this kind.  A final preoperative pause was performed and we proceeded with surgery.  We began with surgical debridement of the wound in preparation for flaps and grafts.  All of the exposed excessive irregular cartilage framework that would not be able to be preserved was sharply removed with a 15 blade scalpel.  This excess cartilage was set aside.  Bleeding was controlled with bipolar monopolar cautery.  The postauricular skin and preauricular skin and cheek were anesthetized with lidocaine and epinephrine in preparation for flap closure.  In addition the right supraclavicular fossa was incised as well.  We began with adjacent tissue transfer of the right cheek with an O to T bilateral advancement flap closure of the preauricular cheek defect with a flap approximately 10 x 6 cm.  Began with sharp dissection of the subcutaneous plane starting with 15 blade followed by Metzenbaum scissors to dissect out extensively across the cheek.  This allowed for bilateral advancement flap closure.  The and releasing back cuts were created along the pre-auricular crease down around the lobule and superiorly along the temporal hairline.  The STA was ligated with bipolar. After  adequate dissection of the subcutaneous plane  the flaps were advanced and extended cutaneous formally was excised approximately 3 x 2 cm.  The flap was closed to itself with buried interrupted 3-0 Vicryl sutures and running 5-0 nylon along the standing cutaneous deformity, staples in the hairline and running 5-0 plain gut along the preauricular crease towards the lobule.  Next we proceeded with adjacent tissue transfer of the right ear with a posterior inferiorly based (posterior auricular artery angiosome) 6 x 2 cm transposition flap.  A 15 blade was used to make the flap designed out of the subcutaneous plane and the flap was harvested with adequate bulk off of the deep periosteum and investing fascia of the muscle.  Once the flap was harvested appropriately it was then transposed into the conchal bowl defect through the helical arch remnant.  The flap was then inset into the conchal bowl defect using combination of Vicryl sutures and Chromic Gut sutures.  The most medial aspect of the flap was inset along the inferior lateral ear canal.  Generous postauricular scalp dissection a subcutaneous plane was performed with the Bovie to allow for improvement and closure of the donor site.  The superior aspect of the donor site was reapproximated to the postauricular Mohs defect with buried 3-0 Vicryl sutures and staples.  Next portions of the auricle were further debrided and then reapproximated to the open wound bed with a combination of Vicryl, Monocryl and Chromic Gut sutures.  This allowed for decrease in the size needed for the full-thickness skin graft.  Next we measured approximately 6 x 2 cm ongoing for skin defect requiring full-thickness skin graft.  A 15 blade was then used to harvest a 7 x 2 cm full-thickness skin graft excision in the right supra supraclavicular fossa.  The skin graft was harvested and set aside.  The anterior and posterior skin flaps were then dissected out in a subcutaneous plane to allow for tension-free closure of the donor  site.  Care was taken to stay above the platysma muscle.  This wound was then closed with buried interrupted 3-0 Vicryl sutures and a running locking 5-0 nylon.  Both skin grafts were then defatted sharply to the dermal layer.  The skin grafts were trimmed and contoured and inset into the remaining Mohs defect site with a combination of 4-0 and 5-0 chromic gut sutures.  The entire Mohs defect was completely reconstructed.  4-0 chromic tacking sutures were used to further secure the skin graft to the periosteum of the temporal bone.  Next a ambrus pack was placed in the outer ear canal and instilled with Ciprodex drops.  Next Xeroform bolsters and bacitracin were placed atop the skin graft and secured with 3-0 silk sutures.  All wounds were then fully cleansed and dressed with bacitracin, Telfa, brown paper tape.  The patient was then turned back to the anesthetist asked me to have brought in the recovery room in stable condition.  All counts were correct and follow-up as present for the entire surgical procedure.   Pamala Hurry, MD [facility]  08/28/2022

## 2022-08-28 NOTE — H&P (Signed)
Don [name] is an 67 y.o. male.    Chief Complaint:  Squamous cell carcinoma of the ear  HPI: Patient presents today for planned elective procedure.  He/she denies any interval change in history since office visit on 07/22/22.   He had Mohs surgery, 5 stages with Dr. Winifred Olive yesterday achieving surgical clearance, with some concern for PNI. Large defect. Patient very grateful for the surgery by Dr. Winifred Olive yesterday.  On Aspirin.  Past Medical History:  Diagnosis Date   Arthritis    Cancer ([place])    squamous cell "all in my face arms and hands"   Chronic pain    COPD (chronic obstructive pulmonary disease) ([facility])    Depression    Hypertension    Renal infarct ([place]) 06/10/2022   Skin cancer     Past Surgical History:  Procedure Laterality Date   cartilage removed from both knees     KNEE ARTHROSCOPY     left wrist     SKIN CANCER EXCISION      Family History  Problem Relation Age of Onset   Cancer Father        Skin   Diabetes Father    Hypertension Father     Social History:  reports that he has been smoking cigarettes. He has a 15.00 pack-year smoking history. He has been exposed to tobacco smoke. He has never used smokeless tobacco. He reports that he does not drink alcohol and does not use drugs.  Allergies: No Known Allergies  Medications Prior to Admission  Medication Sig Dispense Refill   albuterol (VENTOLIN HFA) 108 (90 Base) MCG/ACT inhaler Inhale 1-2 puffs into the lungs every 4 (four) hours as needed for wheezing or shortness of breath. 1 each 2   aspirin 81 MG chewable tablet Chew 1 tablet (81 mg total) by mouth daily.     losartan-hydrochlorothiazide (HYZAAR) 100-12.5 MG tablet Take 1 tablet by mouth daily. 30 tablet 2   Multiple Vitamins-Minerals (MULTIVITAMIN WITH MINERALS) tablet Take 1 tablet by mouth 3 (three) times a week.     tamsulosin (FLOMAX) 0.4 MG CAPS capsule Take 1 capsule (0.4 mg total) by mouth daily. 30 capsule 2   benzonatate  (TESSALON) 100 MG capsule Take 1 capsule (100 mg total) by mouth 2 (two) times daily as needed for cough. (Patient not taking: Reported on 08/21/2022) 20 capsule 0    No results found for this or any previous visit (from the past 48 hour(s)). No results found.  ROS: negative other than stated in HPI  Blood pressure (!) 164/90, pulse 81, temperature 98.1 F (36.7 C), temperature source Oral, resp. rate 18, height 5\' 11"  (1.803 m), weight 86.6 kg, SpO2 98 %.  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 Informed consent obtained. Risks discussed including pain, bleeding, infection, scarring, numbness, poor cosmesis, wound complications, ear canal stenosis, need for further surgery, risks of anesthesia. Despite these risks the patient requested to proceed with surgery.  Also discussed with patient will order CT Neck with contrast as an outpatient for regional staging to assess for lymph nodes given Dr. Hubbard Hartshorn assessment of this being a high risk tumor for locoregional metastasis. Findings will determine role for adjuvant surgery. I.e parotidectomy  and/or neck dissection.    Electronically signed by:  Jenetta Downer, MD  Staff Physician Facial Plastic & Reconstructive Surgery Otolaryngology - Head and Neck Surgery [place]  08/28/2022, 8:56 AM

## 2022-08-29 ENCOUNTER — Encounter (HOSPITAL_COMMUNITY): Payer: Self-pay | Admitting: Otolaryngology

## 2022-08-29 NOTE — Anesthesia Postprocedure Evaluation (Signed)
Anesthesia Post Note  Patient: Don [name]  Procedure(s) Performed: EXCISION OF EAR WOUND (Right) RIGHT EXCISION OF EAR WOUND, ADJACENT TISSUE TRANSFER, RIGHT SKIN GRAFT FROM NECK TO EAR (Right: Ear)     Patient location during evaluation: PACU Anesthesia Type: General Level of consciousness: sedated and patient cooperative Pain management: pain level controlled Vital Signs Assessment: post-procedure vital signs reviewed and stable Respiratory status: spontaneous breathing Cardiovascular status: stable Anesthetic complications: no   No notable events documented.  Last Vitals:  Vitals:   08/28/22 1525 08/28/22 1540  BP: 139/85 (!) 145/85  Pulse: 96 86  Resp: 11 14  Temp:  36.9 C  SpO2: 97% 98%    Last Pain:  Vitals:   08/28/22 1510  TempSrc:   PainSc: 0-No pain                 Nolon Nations

## 2022-08-30 ENCOUNTER — Other Ambulatory Visit (HOSPITAL_COMMUNITY): Payer: Self-pay | Admitting: Otolaryngology

## 2022-08-30 DIAGNOSIS — C44222 Squamous cell carcinoma of skin of right ear and external auricular canal: Secondary | ICD-10-CM

## 2022-09-03 ENCOUNTER — Ambulatory Visit: Payer: Medicare PPO | Admitting: Family Medicine

## 2022-09-09 ENCOUNTER — Other Ambulatory Visit: Payer: Self-pay | Admitting: Otolaryngology

## 2022-09-17 ENCOUNTER — Ambulatory Visit (HOSPITAL_BASED_OUTPATIENT_CLINIC_OR_DEPARTMENT_OTHER)
Admission: RE | Admit: 2022-09-17 | Discharge: 2022-09-17 | Disposition: A | Discharge status: Home / Self Care | Payer: Medicare PPO | Source: Ambulatory Visit | Attending: Otolaryngology | Admitting: Otolaryngology

## 2022-09-17 ENCOUNTER — Encounter (HOSPITAL_BASED_OUTPATIENT_CLINIC_OR_DEPARTMENT_OTHER): Payer: Self-pay

## 2022-09-17 DIAGNOSIS — C44222 Squamous cell carcinoma of skin of right ear and external auricular canal: Secondary | ICD-10-CM

## 2022-09-17 MED ORDER — IOHEXOL 300 MG/ML  SOLN
100.0000 mL | Freq: Once | INTRAMUSCULAR | Status: AC | PRN
  Administered 2022-09-17: 75 mL via INTRAVENOUS

## 2022-09-20 ENCOUNTER — Telehealth: Payer: Self-pay | Admitting: Family Medicine

## 2022-09-20 NOTE — Telephone Encounter (Signed)
Contacted Don [name] to schedule their annual wellness visit. Appointment made for 09/26/2022.  Thank you,  [facility] Direct dial  [phone]

## 2022-09-26 ENCOUNTER — Ambulatory Visit (INDEPENDENT_AMBULATORY_CARE_PROVIDER_SITE_OTHER): Payer: Medicare PPO | Admitting: *Deleted

## 2022-09-26 DIAGNOSIS — Z0000 Encounter for general adult medical examination without abnormal findings: Secondary | ICD-10-CM

## 2022-09-26 NOTE — Patient Instructions (Signed)
Don [name] , Thank you for taking time to come for your Medicare Wellness Visit. I appreciate your ongoing commitment to your health goals. Please review the following plan we discussed and let me know if I can assist you in the future.   Screening recommendations/referrals: Colonoscopy: Education provided Recommended yearly ophthalmology/optometry visit for glaucoma screening and checkup Recommended yearly dental visit for hygiene and checkup  Vaccinations: Influenza vaccine: up to date Pneumococcal vaccine: Education provided Tdap vaccine: Education provided Shingles vaccine: Education provided    Advanced directives: Education provided    Preventive Care 65 Years and Older, Male Preventive care refers to lifestyle choices and visits with your health care provider that can promote health and wellness. What does preventive care include? A yearly physical exam. This is also called an annual well check. Dental exams once or twice a year. Routine eye exams. Ask your health care provider how often you should have your eyes checked. Personal lifestyle choices, including: Daily care of your teeth and gums. Regular physical activity. Eating a healthy diet. Avoiding tobacco and drug use. Limiting alcohol use. Practicing safe sex. Taking low doses of aspirin every day. Taking vitamin and mineral supplements as recommended by your health care provider. What happens during an annual well check? The services and screenings done by your health care provider during your annual well check will depend on your age, overall health, lifestyle risk factors, and family history of disease. Counseling  Your health care provider may ask you questions about your: Alcohol use. Tobacco use. Drug use. Emotional well-being. Home and relationship well-being. Sexual activity. Eating habits. History of falls. Memory and ability to understand (cognition). Work and work Astronomer. Screening  You  may have the following tests or measurements: Height, weight, and BMI. Blood pressure. Lipid and cholesterol levels. These may be checked every 5 years, or more frequently if you are over 42 years old. Skin check. Lung cancer screening. You may have this screening every year starting at age 69 if you have a 30-pack-year history of smoking and currently smoke or have quit within the past 15 years. Fecal occult blood test (FOBT) of the stool. You may have this test every year starting at age 69. Flexible sigmoidoscopy or colonoscopy. You may have a sigmoidoscopy every 5 years or a colonoscopy every 10 years starting at age 12. Prostate cancer screening. Recommendations will vary depending on your family history and other risks. Hepatitis C blood test. Hepatitis B blood test. Sexually transmitted disease (STD) testing. Diabetes screening. This is done by checking your blood sugar (glucose) after you have not eaten for a while (fasting). You may have this done every 1-3 years. Abdominal aortic aneurysm (AAA) screening. You may need this if you are a current or former smoker. Osteoporosis. You may be screened starting at age 69 if you are at high risk. Talk with your health care provider about your test results, treatment options, and if necessary, the need for more tests. Vaccines  Your health care provider may recommend certain vaccines, such as: Influenza vaccine. This is recommended every year. Tetanus, diphtheria, and acellular pertussis (Tdap, Td) vaccine. You may need a Td booster every 10 years. Zoster vaccine. You may need this after age 67. Pneumococcal 13-valent conjugate (PCV13) vaccine. One dose is recommended after age 2. Pneumococcal polysaccharide (PPSV23) vaccine. One dose is recommended after age 69. Talk to your health care provider about which screenings and vaccines you need and how often you need them. This information is not  intended to replace advice given to you by your  health care provider. Make sure you discuss any questions you have with your health care provider. Document Released: 06/16/2015 Document Revised: 02/07/2016 Document Reviewed: 03/21/2015 Elsevier Interactive Patient Education  2017 [place] Prevention in the Home Falls can cause injuries. They can happen to people of all ages. There are many things you can do to make your home safe and to help prevent falls. What can I do on the outside of my home? Regularly fix the edges of walkways and driveways and fix any cracks. Remove anything that might make you trip as you walk through a door, such as a raised step or threshold. Trim any bushes or trees on the path to your home. Use bright outdoor lighting. Clear any walking paths of anything that might make someone trip, such as rocks or tools. Regularly check to see if handrails are loose or broken. Make sure that both sides of any steps have handrails. Any raised decks and porches should have guardrails on the edges. Have any leaves, snow, or ice cleared regularly. Use sand or salt on walking paths during winter. Clean up any spills in your garage right away. This includes oil or grease spills. What can I do in the bathroom? Use night lights. Install grab bars by the toilet and in the tub and shower. Do not use towel bars as grab bars. Use non-skid mats or decals in the tub or shower. If you need to sit down in the shower, use a plastic, non-slip stool. Keep the floor dry. Clean up any water that spills on the floor as soon as it happens. Remove soap buildup in the tub or shower regularly. Attach bath mats securely with double-sided non-slip rug tape. Do not have throw rugs and other things on the floor that can make you trip. What can I do in the bedroom? Use night lights. Make sure that you have a light by your bed that is easy to reach. Do not use any sheets or blankets that are too big for your bed. They should not hang down  onto the floor. Have a firm chair that has side arms. You can use this for support while you get dressed. Do not have throw rugs and other things on the floor that can make you trip. What can I do in the kitchen? Clean up any spills right away. Avoid walking on wet floors. Keep items that you use a lot in easy-to-reach places. If you need to reach something above you, use a strong step stool that has a grab bar. Keep electrical cords out of the way. Do not use floor polish or wax that makes floors slippery. If you must use wax, use non-skid floor wax. Do not have throw rugs and other things on the floor that can make you trip. What can I do with my stairs? Do not leave any items on the stairs. Make sure that there are handrails on both sides of the stairs and use them. Fix handrails that are broken or loose. Make sure that handrails are as long as the stairways. Check any carpeting to make sure that it is firmly attached to the stairs. Fix any carpet that is loose or worn. Avoid having throw rugs at the top or bottom of the stairs. If you do have throw rugs, attach them to the floor with carpet tape. Make sure that you have a light switch at the top of the stairs  and the bottom of the stairs. If you do not have them, ask someone to add them for you. What else can I do to help prevent falls? Wear shoes that: Do not have high heels. Have rubber bottoms. Are comfortable and fit you well. Are closed at the toe. Do not wear sandals. If you use a stepladder: Make sure that it is fully opened. Do not climb a closed stepladder. Make sure that both sides of the stepladder are locked into place. Ask someone to hold it for you, if possible. Clearly mark and make sure that you can see: Any grab bars or handrails. First and last steps. Where the edge of each step is. Use tools that help you move around (mobility aids) if they are needed. These include: Canes. Walkers. Scooters. Crutches. Turn  on the lights when you go into a dark area. Replace any light bulbs as soon as they burn out. Set up your furniture so you have a clear path. Avoid moving your furniture around. If any of your floors are uneven, fix them. If there are any pets around you, be aware of where they are. Review your medicines with your doctor. Some medicines can make you feel dizzy. This can increase your chance of falling. Ask your doctor what other things that you can do to help prevent falls. This information is not intended to replace advice given to you by your health care provider. Make sure you discuss any questions you have with your health care provider. Document Released: 03/16/2009 Document Revised: 10/26/2015 Document Reviewed: 06/24/2014 Elsevier Interactive Patient Education  2017 [employer].

## 2022-09-26 NOTE — Progress Notes (Signed)
Subjective:   Don [name] is a 67 y.o. male who presents for an Initial Medicare Annual Wellness Visit.  I connected with  Lars Masson on 09/26/22 by a telephone enabled telemedicine application and verified that I am speaking with the correct person using two identifiers.   I discussed the limitations of evaluation and management by telemedicine. The patient expressed understanding and agreed to proceed.  Patient location: home  Provider location: telephone/home    Review of Systems     Cardiac Risk Factors include: advanced age (>8men, >54 women);male gender;hypertension;smoking/ tobacco exposure;sedentary lifestyle     Objective:    Today's Vitals   09/26/22 1335  PainSc: 7    There is no height or weight on file to calculate BMI.     09/26/2022    1:38 PM 08/28/2022    8:16 AM 08/26/2022    9:37 AM 06/10/2022    2:32 PM 06/09/2022    3:18 PM 09/10/2021    5:35 PM 07/17/2016    8:00 AM  Advanced Directives  Does Patient Have a Medical Advance Directive? No No No  No No No  Would patient like information on creating a medical advance directive? No - Patient declined No - Patient declined No - Patient declined No - Patient declined   No - Patient declined    Current Medications (verified) Outpatient Encounter Medications as of 09/26/2022  Medication Sig   acetaminophen (TYLENOL) 500 MG tablet Take 1 tablet (500 mg total) by mouth every 6 (six) hours as needed.   albuterol (VENTOLIN HFA) 108 (90 Base) MCG/ACT inhaler Inhale 1-2 puffs into the lungs every 4 (four) hours as needed for wheezing or shortness of breath.   aspirin 81 MG chewable tablet Chew 1 tablet (81 mg total) by mouth daily.   benzonatate (TESSALON) 100 MG capsule Take 1 capsule (100 mg total) by mouth 2 (two) times daily as needed for cough.   losartan-hydrochlorothiazide (HYZAAR) 100-12.5 MG tablet Take 1 tablet by mouth daily.   Multiple Vitamins-Minerals (MULTIVITAMIN WITH MINERALS) tablet  Take 1 tablet by mouth 3 (three) times a week.   tamsulosin (FLOMAX) 0.4 MG CAPS capsule Take 1 capsule (0.4 mg total) by mouth daily.   oxyCODONE (ROXICODONE) 5 MG immediate release tablet Take 1 tablet (5 mg total) by mouth every 6 (six) hours as needed for severe pain or breakthrough pain (Pain not relieved by Tylenol). (Patient not taking: Reported on 09/26/2022)   No facility-administered encounter medications on file as of 09/26/2022.    Allergies (verified) Patient has no known allergies.   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   Skin cancer    Past Surgical History:  Procedure Laterality Date   cartilage removed from both knees     EXCISION MASS HEAD Right 08/28/2022   Procedure: EXCISION OF EAR WOUND;  Surgeon: Scarlette Ar, MD;  Location: [facility];  Service: ENT;  Laterality: Right;   KNEE ARTHROSCOPY     left wrist     SKIN CANCER EXCISION     SKIN FULL THICKNESS GRAFT Right  08/28/2022   Procedure: RIGHT EXCISION OF EAR WOUND, ADJACENT TISSUE TRANSFER, RIGHT SKIN GRAFT FROM NECK TO EAR;  Surgeon: Scarlette Ar, MD;  Location: [facility] OR;  Service: ENT;  Laterality: Right;   Family History  Problem Relation Age of Onset   Cancer Father        Skin   Diabetes Father    Hypertension Father    Social History   Socioeconomic History   Marital status: Significant Other    Spouse name: Not on file   Number of children: 2   Years of education: Not on file   Highest education level: Some college, no degree  Occupational History   Occupation: Retired  Tobacco Use   Smoking status: Every Day    Packs/day: 0.50    Years: 30.00     Additional pack years: 0.00    Total pack years: 15.00    Types: Cigarettes    Passive exposure: Current   Smokeless tobacco: Never  Vaping Use   Vaping Use: Never used  Substance and Sexual Activity   Alcohol use: No   Drug use: No   Sexual activity: Yes  Other Topics Concern   Not on file  Social History Narrative   Patient lives girlfriend and another gentleman. No pets.    Social Determinants of Health   Financial Resource Strain: Low Risk  (09/26/2022)   Overall Financial Resource Strain (CARDIA)    Difficulty of Paying Living Expenses: Not hard at all  Food Insecurity: No Food Insecurity (09/26/2022)   Hunger Vital Sign    Worried About Running Out of Food in the Last Year: Never true    Ran Out of Food in the Last Year: Never true  Transportation Needs: No Transportation Needs (09/26/2022)   PRAPARE - Administrator, Civil Service (Medical): No    Lack of Transportation (Non-Medical): No  Physical Activity: Inactive (09/26/2022)   Exercise Vital Sign    Days of Exercise per Week: 0 days    Minutes of Exercise per Session: 0 min  Stress: No Stress Concern Present (09/26/2022)   [employer] of Occupational Health - Occupational Stress Questionnaire    Feeling of Stress : Not at all  Social Connections: Moderately Integrated (09/26/2022)   Social Connection and Isolation Panel [NHANES]    Frequency of Communication with Friends and Family: Twice a week    Frequency of Social Gatherings with Friends and Family: Twice a week    Attends Religious Services: More than 4 times per year    Active Member of [employer] or Organizations: No    Attends Engineer, structural: Never    Marital Status: Living with partner    Tobacco Counseling Ready to quit: Not Answered Counseling given: Not Answered   Clinical Intake:  Pre-visit preparation completed: Yes  Pain : 0-10 Pain Score: 7  Pain Type: Chronic pain Pain Location: Knee Pain Orientation:  Left, Right Pain Descriptors / Indicators: Constant, Burning, Aching, Dull Pain Onset: More than a month ago Pain Frequency: Constant     Diabetes: No  How often do you need to have someone help you when you read instructions, pamphlets, or other written materials from your doctor or pharmacy?: 1 - Never  Diabetic?  no  Interpreter Needed?: No  Information entered by :: Remi Haggard LPN   Activities of Daily Living    09/26/2022    1:40 PM 08/26/2022   10:03 AM  In your present state of health, do  you have any difficulty performing the following activities:  Hearing? 0   Vision? 0   Difficulty concentrating or making decisions? 0   Walking or climbing stairs? 1   Dressing or bathing? 0   Doing errands, shopping? 0 0  Preparing Food and eating ? N   Using the Toilet? N   In the past six months, have you accidently leaked urine? N   Do you have problems with loss of bowel control? N   Managing your Medications? N   Managing your Finances? N   Housekeeping or managing your Housekeeping? N     Patient Care Team: Alveria Apley, NP as PCP - General (Family Medicine) Malmfelt, Lise Auer, RN as Oncology Nurse Navigator Lonie Peak, MD as Consulting Physician (Radiation Oncology) Scarlette Ar, MD as Consulting Physician (Otolaryngology) Glennis Brink, MD as Referring Physician (Dermatology) Draffin, Duke Salvia, MD as Referring Physician (Pathology)  Indicate any recent Medical Services you may have received from other than [facility] providers in the past year (date may be approximate).     Assessment:   This is a routine wellness examination for [place].  Hearing/Vision screen Hearing Screening - Comments:: No trouble hearing Vision Screening - Comments:: Not up to date   Dietary issues and exercise activities discussed: Current Exercise Habits: The patient does not participate in regular exercise at present, Exercise limited by: orthopedic condition(s)   Goals  Addressed             This Visit's Progress    Patient Stated       Would like to improve health  Quit smoking     Quit Smoking   Not on track      Depression Screen    09/26/2022    1:42 PM 08/09/2022   12:41 PM 10/03/2021   10:44 AM  PHQ 2/9 Scores  PHQ - 2 Score 2 0 1  PHQ- 9 Score 2 0 1    Fall Risk    09/26/2022    1:39 PM 09/26/2022    1:36 PM 08/09/2022   12:41 PM 10/03/2021   10:44 AM  Fall Risk   Falls in the past year? 0 1 0 0  Number falls in past yr: 0 0 0 0  Injury with Fall? 0 0 0 0  Risk for fall due to :   History of fall(s) History of fall(s)  Follow up Falls evaluation completed;Education provided;Falls prevention discussed Falls evaluation completed;Education provided;Falls prevention discussed Falls evaluation completed Falls evaluation completed    FALL RISK PREVENTION PERTAINING TO THE HOME:  Any stairs in or around the home? Yes  If so, are there any without handrails? No  Home free of loose throw rugs in walkways, pet beds, electrical cords, etc? Yes  Adequate lighting in your home to reduce risk of falls? Yes   ASSISTIVE DEVICES UTILIZED TO PREVENT FALLS:  Life alert? No  Use of a cane, walker or w/c? No  Grab bars in the bathroom? Yes  Shower chair or bench in shower? No  Elevated toilet seat or a handicapped toilet? No   TIMED UP AND GO:  Was the test performed? No .    Cognitive Function:        09/26/2022    1:39 PM  6CIT Screen  What Year? 0 points  What month? 0 points  What time? 0 points  Count back from 20 2 points  Months in reverse 4 points  Repeat phrase 2 points  Total  Score 8 points    Immunizations Immunization History  Administered Date(s) Administered   Moderna Sars-Covid-2 Vaccination 01/22/2020   PFIZER(Purple Top)SARS-COV-2 Vaccination 02/01/2020   Tdap 05/11/2013    TDAP status: Up to date  Flu Vaccine status: Up to date  Pneumococcal vaccine status: Due, Education has been provided regarding the  importance of this vaccine. Advised may receive this vaccine at local pharmacy or Health Dept. Aware to provide a copy of the vaccination record if obtained from local pharmacy or Health Dept. Verbalized acceptance and understanding.  Covid-19 vaccine status: Information provided on how to obtain vaccines.   Qualifies for Shingles Vaccine? Yes   Zostavax completed No   Shingrix Completed?: No.    Education has been provided regarding the importance of this vaccine. Patient has been advised to call insurance company to determine out of pocket expense if they have not yet received this vaccine. Advised may also receive vaccine at local pharmacy or Health Dept. Verbalized acceptance and understanding.  Screening Tests Health Maintenance  Topic Date Due   COLONOSCOPY (Pts 45-31yrs Insurance coverage will need to be confirmed)  Never done   Zoster Vaccines- Shingrix (1 of 2) 11/09/2022 (Originally 12/08/1974)   Pneumonia Vaccine 24+ Years old (1 of 2 - PCV) 08/09/2023 (Originally 12/09/1961)   INFLUENZA VACCINE  01/02/2023   DTaP/Tdap/Td (2 - Td or Tdap) 05/12/2023   Lung Cancer Screening  06/10/2023   Medicare Annual Wellness (AWV)  09/26/2023   HPV VACCINES  Aged Out   COVID-19 Vaccine  Discontinued   Hepatitis C Screening  Discontinued    Health Maintenance  Health Maintenance Due  Topic Date Due   COLONOSCOPY (Pts 45-75yrs Insurance coverage will need to be confirmed)  Never done    Colonoscopy decline  Lung Cancer Screening: (Low Dose CT Chest recommended if Age 29-80 years, 30 pack-year currently smoking OR have quit w/in 15years.) does qualify.   Lung Cancer Screening Referral: not due   Additional Screening:  Hepatitis C Screening  never done  Vision Screening: Recommended annual ophthalmology exams for early detection of glaucoma and other disorders of the eye. Is the patient up to date with their annual eye exam?  No  Who is the provider or what is the name of the office in  which the patient attends annual eye exams?  If pt is not established with a provider, would they like to be referred to a provider to establish care? No .   Dental Screening: Recommended annual dental exams for proper oral hygiene  Community Resource Referral / Chronic Care Management: CRR required this visit?  No   CCM required this visit?  No      Plan:     I have personally reviewed and noted the following in the patient's chart:   Medical and social history Use of alcohol, tobacco or illicit drugs  Current medications and supplements including opioid prescriptions. Patient is not currently taking opioid prescriptions. Functional ability and status Nutritional status Physical activity Advanced directives List of other physicians Hospitalizations, surgeries, and ER visits in previous 12 months Vitals Screenings to include cognitive, depression, and falls Referrals and appointments  In addition, I have reviewed and discussed with patient certain preventive protocols, quality metrics, and best practice recommendations. A written personalized care plan for preventive services as well as general preventive health recommendations were provided to patient.     Remi Haggard, LPN   1/61/0960   Nurse Notes:

## 2022-10-01 ENCOUNTER — Telehealth: Payer: Self-pay | Admitting: Family Medicine

## 2022-10-01 NOTE — Telephone Encounter (Signed)
Pt informed

## 2022-10-01 NOTE — Telephone Encounter (Signed)
Encourage patient to contact the pharmacy for refills or they can request refills through [facility]   WHAT PHARMACY WOULD THEY LIKE THIS SENT TO:  [facility] [address] [place] - [address]   MEDICATION NAME & DOSE: tamsulosin (FLOMAX) 0.4 MG CAPS capsule  NOTES/COMMENTS FROM PATIENT:    Front office please notify patient: It takes 48-72 hours to process rx refill requests Ask patient to call pharmacy to ensure rx is ready before heading there.

## 2022-10-02 ENCOUNTER — Other Ambulatory Visit: Payer: Self-pay | Admitting: Otolaryngology

## 2022-10-02 ENCOUNTER — Other Ambulatory Visit: Payer: Self-pay

## 2022-10-02 ENCOUNTER — Encounter (HOSPITAL_COMMUNITY): Payer: Self-pay | Admitting: Otolaryngology

## 2022-10-02 DIAGNOSIS — F32A Depression, unspecified: Secondary | ICD-10-CM | POA: Diagnosis not present

## 2022-10-02 DIAGNOSIS — M952 Other acquired deformity of head: Secondary | ICD-10-CM | POA: Diagnosis present

## 2022-10-02 DIAGNOSIS — I10 Essential (primary) hypertension: Secondary | ICD-10-CM | POA: Diagnosis not present

## 2022-10-02 DIAGNOSIS — H0289 Other specified disorders of eyelid: Secondary | ICD-10-CM | POA: Diagnosis not present

## 2022-10-02 DIAGNOSIS — G8929 Other chronic pain: Secondary | ICD-10-CM | POA: Diagnosis not present

## 2022-10-02 DIAGNOSIS — F17210 Nicotine dependence, cigarettes, uncomplicated: Secondary | ICD-10-CM | POA: Diagnosis not present

## 2022-10-02 DIAGNOSIS — Z85828 Personal history of other malignant neoplasm of skin: Secondary | ICD-10-CM | POA: Diagnosis not present

## 2022-10-02 DIAGNOSIS — J449 Chronic obstructive pulmonary disease, unspecified: Secondary | ICD-10-CM | POA: Diagnosis not present

## 2022-10-02 DIAGNOSIS — H61111 Acquired deformity of pinna, right ear: Secondary | ICD-10-CM | POA: Diagnosis not present

## 2022-10-02 NOTE — Progress Notes (Signed)
SDW call  Patient was given pre-op instructions over the phone. Patient verbalized understanding of instructions provided. Encouraged patient to get VM set up on his phone as it took approx 8 calls to reach someone.     PCP - Dr. Dondra Spry Cardiologist - Denies Pulmonary: Denies   PPM/ICD - Denies  Chest x-ray - 06/09/2022 EKG -  06/09/2022 Stress Test - ECHO - 06/10/2022 Cardiac Cath -   Sleep Study/sleep apnea/CPAP: Denies  Non-diabetic  Blood Thinner Instructions: Denies Aspirin Instructions: Last dose 10/02/2022   ERAS Protcol - Yes, clear liquids until 0915 PRE-SURGERY Ensure or G2-    COVID TEST- n/a    Anesthesia review: Yes. HTN, renal infact, COPD, + UDS for opiates and amphetamines without a prescription   Patient denies shortness of breath, fever, cough and chest pain over the phone call  Your procedure is scheduled on Thursday Oct 03, 2022  Report to [facility] Main Entrance "A" at 0945 A.M., then check in with the Admitting office.  Call this number if you have problems the morning of surgery:  [phone]   If you have any questions prior to your surgery date call [phone]: Open Monday-Friday 8am-4pm If you experience any cold or flu symptoms such as cough, fever, chills, shortness of breath, etc. between now and your scheduled surgery, please notify us at the above number   Remember:  Do not eat after midnight the night before your surgery  You may drink clear liquids until 0915 the morning of your surgery.   Clear liquids allowed are: Water, Non-Citrus Juices (without pulp), Carbonated Beverages, Clear Tea, Black Coffee ONLY (NO MILK, CREAM OR POWDERED CREAMER of any kind), and Gatorade   Take these medicines the morning of surgery with A SIP OF WATER:  None  As needed: Tylenol, albuterol  As of today, STOP taking any Aspirin (unless otherwise instructed by your surgeon) Aleve, Naproxen, Ibuprofen, Motrin, Advil, Goody's, BC's, all herbal  medications, fish oil, and all vitamins.

## 2022-10-02 NOTE — Progress Notes (Signed)
Anesthesia Chart Review: [name]  Case: [id number] Date/Time: 10/03/22 1200   Procedures:      EXCISION OF LT. CHEEK AND RT. EAR WOUND     ADJACENT TISSUE TRANSFER TO LT. CHEEK AND RT. 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 ear; Squamous cell carcinoma; History of nonmelanoma skin cancer,   [facility]   Surgeons: Scarlette Ar, MD       DISCUSSION: Patient is a 67 year old male scheduled for the above procedure. He is s/p full thickness skin graft wound for right ear SCC Mohs defect on 08/28/22. Now with planned left cheek skin cancer excision with Dr. Jeannine Boga followed by reconstruction by Dr. Ernestene Kiel on 10/03/22.    History includes smoking, HTN, COPD, skin cancer (SCC), chronic pain, polysubstance abuse. Findings of chronic pancreatitis and possible renal infarct ("likely subacute") on 06/09/22 CT.    [facility] admission 06/09/22-06/11/22 for hypertensive urgency with intractable N/V. He had run out of medication after his PCP retired. UDS + amphetamines and opiates.  CTA of the chest/abd/pelvis suggestive of chronic pancreatitis and incidental finding of wedge-shaped hypodensity in the inferior right kidney, felt likely subacute renal infarct. No evidence of aneurysm, dissection, vasculitis, or significant stenosis of renals, SMA, celiac, aorta on CTA. 06/10/22 echo showed no evidence or thrombus, EF 55-60%, no regional wall motion abnormalities, mild LVH of the basal-septal segment, grade 1 diastolic dysfunction, normal RVSF, no significant valvular disease. Consider further out-patient evaluation, but for now ASA recommended. He briefly required Cardene infusion, but HTN improved after resuming home medications.    Updated labs as indicated on arrival. Last labs on 08/09/22 at PCP visit with  Don Apley, NP to establish care.     He is on losartan-HCTZ 100-12.5 mg daily for HTN. BP 145/85 on 08/28/22. Marland Kitchen    He has known skin cancer with plans for reconstruction following Mohs defect. Anesthesia team to evaluate on the day of surgery.    VS:  BP Readings from Last 3 Encounters:  08/28/22 (!) 145/85  08/26/22 (!) 152/79  08/09/22 (!) 108/58   Pulse Readings from Last 3 Encounters:  08/28/22 86  08/26/22 (!) 57  08/09/22 65     PROVIDERS: Don Apley, NP is PCP    LABS: For day of surgery as indicated. Last results in [facility] include: Lab Results  Component Value Date   WBC 7.1 08/09/2022   HGB 14.2 08/09/2022   HCT 42.2 08/09/2022   PLT 205.0 08/09/2022   GLUCOSE 86 08/09/2022   ALT 29 08/09/2022   AST 34 08/09/2022   NA 137 08/09/2022   K 4.6 08/09/2022   CL 101 08/09/2022   CREATININE 0.97 08/09/2022   BUN 11 08/09/2022   CO2 30 08/09/2022   TSH 2.43 10/03/2021   PSA 0.68 10/03/2021   HGBA1C 6.1 10/03/2021    IMAGES: CT Soft tissue neck 09/17/22: MPRESSION: 1. Postsurgical changes of the periauricular soft tissues. 2. Bilateral subcentimeter submandibular lymph nodes, likely reactive. None enlarged or of abnormal density. 3. A 10 mm cyst in the right hypopharynx.     CTA  Chest/abd/pelvis 06/09/22: IMPRESSION: 1. No evidence for aortic dissection or aneurysm. 2. Wedge-shaped hypodensity in the inferior right kidney worrisome for renal infarct. Infection would  be in the differential. 3. Findings compatible with chronic pancreatitis. 4. Colonic diverticulosis. Aortic Atherosclerosis (ICD10-I70.0).   CT Head 06/09/22: IMPRESSION: Atrophy and chronic small vessel ischemic changes. No acute intracranial process identified.     EKG: 06/09/22: Sinus tachycardia at 103 bpm Right atrial enlargement Left anterior fascicular block Low voltage, precordial leads Baseline wander in lead(s) II Abnormal ECG Confirmed by Gerhard Munch  [phone]) on 06/09/2022 3:38:16 PM     CV: Echo 06/10/22: IMPRESSIONS   1. Left ventricular ejection fraction, by estimation, is 55 to 60%. The  left ventricle has normal function. The left ventricle has no regional  wall motion abnormalities. There is mild left ventricular hypertrophy of  the basal-septal segment. Left  ventricular diastolic parameters are consistent with Grade I diastolic  dysfunction (impaired relaxation).   2. Right ventricular systolic function is normal. The right ventricular  size is normal.   3. The mitral valve is grossly normal. No evidence of mitral valve  regurgitation. No evidence of mitral stenosis.   4. The aortic valve is tricuspid. Aortic valve regurgitation is not  visualized. No aortic stenosis is present.   5. The inferior vena cava is normal in size with greater than 50%  respiratory variability, suggesting right atrial pressure of 3 mmHg.   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 OR;  Service: ENT;  Laterality: Right;   KNEE ARTHROSCOPY     left wrist     SKIN CANCER EXCISION     SKIN FULL THICKNESS GRAFT Right 08/28/2022   Procedure: RIGHT EXCISION OF EAR WOUND, ADJACENT TISSUE TRANSFER, RIGHT SKIN GRAFT FROM NECK TO EAR;  Surgeon: Scarlette Ar, MD;  Location[facility] OR;  Service: ENT;  Laterality: Right;    MEDICATIONS: No current facility-administered medications for  this encounter.    acetaminophen (TYLENOL) 500 MG tablet   albuterol (VENTOLIN HFA) 108 (90 Base) MCG/ACT inhaler   aspirin 81 MG chewable tablet   diphenhydrAMINE (BENADRYL) 25 MG tablet   ibuprofen (ADVIL) 200 MG tablet   losartan-hydrochlorothiazide (HYZAAR) 100-12.5 MG tablet   Multiple Vitamins-Minerals (MULTIVITAMIN WITH MINERALS) tablet   mupirocin ointment (BACTROBAN) 2 %   benzonatate (TESSALON) 100 MG capsule   oxyCODONE (ROXICODONE) 5 MG immediate release tablet   tamsulosin (FLOMAX) 0.4 MG CAPS capsule    Shonna Chock, PA-C Surgical Short Stay/Anesthesiology [facility] Phone [phone] [facility] Phone [phone] 10/02/2022 12:35 PM

## 2022-10-02 NOTE — Anesthesia Preprocedure Evaluation (Signed)
Anesthesia Evaluation  Patient identified by MRN, date of birth, ID band Patient awake    Reviewed: Allergy & Precautions, NPO status , Patient's Chart, lab work & pertinent test results  Airway Mallampati: II  TM Distance: >3 FB Neck ROM: Full    Dental  (+) Edentulous Upper, Edentulous Lower, Dental Advisory Given   Pulmonary COPD,  COPD inhaler, Current Smoker and Patient abstained from smoking.   Pulmonary exam normal breath sounds clear to auscultation       Cardiovascular hypertension, Pt. on medications Normal cardiovascular exam Rhythm:Regular Rate:Normal  TTE 2024 Echo 06/2022  1. Left ventricular ejection fraction, by estimation, is 55 to 60%. The left ventricle has normal function. The left ventricle has no regional wall motion abnormalities. There is mild left ventricular hypertrophy of the basal-septal segment. Left ventricular diastolic parameters are consistent with Grade I diastolic dysfunction (impaired relaxation).   2. Right ventricular systolic function is normal. The right ventricular size is normal.   3. The mitral valve is grossly normal. No evidence of mitral valve regurgitation. No evidence of mitral stenosis.   4. The aortic valve is tricuspid. Aortic valve regurgitation is not visualized. No aortic stenosis is present.   5. The inferior vena cava is normal in size with greater than 50% respiratory variability, suggesting right atrial pressure of 3 mmHg.     Neuro/Psych  PSYCHIATRIC DISORDERS  Depression    negative neurological ROS     GI/Hepatic negative GI ROS,,,(+)     substance abuse  methamphetamine use  Endo/Other  negative endocrine ROS    Renal/GU negative Renal ROS  negative genitourinary   Musculoskeletal negative musculoskeletal ROS (+)  narcotic dependent  Abdominal   Peds  Hematology negative hematology ROS (+)   Anesthesia Other Findings History includes smoking, HTN, COPD,  skin cancer (SCC), chronic pain, polysubstance abuse. Findings of chronic pancreatitis and possible renal infarct ("likely subacute") on 06/09/22 CT  Reproductive/Obstetrics                             Anesthesia Physical Anesthesia Plan  ASA: 3  Anesthesia Plan: General   Post-op Pain Management: Tylenol PO (pre-op)*   Induction: Intravenous  PONV Risk Score and Plan: 1 and Midazolam, Dexamethasone and Ondansetron  Airway Management Planned: Oral ETT  Additional Equipment:   Intra-op Plan:   Post-operative Plan: Extubation in OR  Informed Consent: I have reviewed the patients History and Physical, chart, labs and discussed the procedure including the risks, benefits and alternatives for the proposed anesthesia with the patient or authorized representative who has indicated his/her understanding and acceptance.     Dental advisory given  Plan Discussed with: CRNA  Anesthesia Plan Comments: (PAT note written 10/02/2022 by Shonna Chock, PA-C.  )       Anesthesia Quick Evaluation

## 2022-10-03 ENCOUNTER — Encounter (HOSPITAL_COMMUNITY): Payer: Self-pay | Admitting: Otolaryngology

## 2022-10-03 ENCOUNTER — Ambulatory Visit (HOSPITAL_BASED_OUTPATIENT_CLINIC_OR_DEPARTMENT_OTHER): Payer: Medicare PPO

## 2022-10-03 ENCOUNTER — Encounter (HOSPITAL_COMMUNITY)
Admission: RE | Disposition: A | Discharge status: Home / Self Care | Payer: Self-pay | Source: Home / Self Care | Attending: Otolaryngology

## 2022-10-03 ENCOUNTER — Ambulatory Visit (HOSPITAL_COMMUNITY)
Admission: RE | Admit: 2022-10-03 | Discharge: 2022-10-03 | Disposition: A | Discharge status: Home / Self Care | Payer: Medicare PPO | Attending: Otolaryngology | Admitting: Otolaryngology

## 2022-10-03 ENCOUNTER — Other Ambulatory Visit: Payer: Self-pay

## 2022-10-03 ENCOUNTER — Ambulatory Visit (HOSPITAL_COMMUNITY): Payer: Medicare PPO | Admitting: Physician Assistant

## 2022-10-03 ENCOUNTER — Ambulatory Visit (HOSPITAL_BASED_OUTPATIENT_CLINIC_OR_DEPARTMENT_OTHER): Payer: Medicare PPO | Admitting: Physician Assistant

## 2022-10-03 ENCOUNTER — Encounter (HOSPITAL_BASED_OUTPATIENT_CLINIC_OR_DEPARTMENT_OTHER): Payer: Self-pay

## 2022-10-03 DIAGNOSIS — M952 Other acquired deformity of head: Secondary | ICD-10-CM

## 2022-10-03 DIAGNOSIS — F17210 Nicotine dependence, cigarettes, uncomplicated: Secondary | ICD-10-CM | POA: Insufficient documentation

## 2022-10-03 DIAGNOSIS — F32A Depression, unspecified: Secondary | ICD-10-CM | POA: Insufficient documentation

## 2022-10-03 DIAGNOSIS — I10 Essential (primary) hypertension: Secondary | ICD-10-CM | POA: Diagnosis not present

## 2022-10-03 DIAGNOSIS — C44222 Squamous cell carcinoma of skin of right ear and external auricular canal: Secondary | ICD-10-CM

## 2022-10-03 DIAGNOSIS — J449 Chronic obstructive pulmonary disease, unspecified: Secondary | ICD-10-CM | POA: Insufficient documentation

## 2022-10-03 DIAGNOSIS — H0289 Other specified disorders of eyelid: Secondary | ICD-10-CM | POA: Insufficient documentation

## 2022-10-03 DIAGNOSIS — Z85828 Personal history of other malignant neoplasm of skin: Secondary | ICD-10-CM | POA: Insufficient documentation

## 2022-10-03 DIAGNOSIS — G8929 Other chronic pain: Secondary | ICD-10-CM | POA: Insufficient documentation

## 2022-10-03 DIAGNOSIS — H61111 Acquired deformity of pinna, right ear: Secondary | ICD-10-CM | POA: Diagnosis not present

## 2022-10-03 HISTORY — PX: EXCISION MASS HEAD: SHX6702

## 2022-10-03 HISTORY — PX: SKIN FULL THICKNESS GRAFT: SHX442

## 2022-10-03 LAB — BASIC METABOLIC PANEL
Anion gap: 14 (ref 5–15)
BUN: 6 mg/dL — ABNORMAL LOW (ref 8–23)
CO2: 22 mmol/L (ref 22–32)
Calcium: 10 mg/dL (ref 8.9–10.3)
Chloride: 97 mmol/L — ABNORMAL LOW (ref 98–111)
Creatinine, Ser: 0.92 mg/dL (ref 0.61–1.24)
GFR, Estimated: >60 mL/min (ref >60)
Glucose, Bld: 153 mg/dL — ABNORMAL HIGH (ref 70–99)
Potassium: 3.9 mmol/L (ref 3.5–5.1)
Sodium: 133 mmol/L — ABNORMAL LOW (ref 135–145)

## 2022-10-03 LAB — CBC
HCT: 41.4 % (ref 39.0–52.0)
Hemoglobin: 13.9 g/dL (ref 13.0–17.0)
MCH: 32.1 pg (ref 26.0–34.0)
MCHC: 33.6 g/dL (ref 30.0–36.0)
MCV: 95.6 fL (ref 80.0–100.0)
Platelets: UNDETERMINED 10*3/uL (ref 150–400)
RBC: 4.33 MIL/uL (ref 4.22–5.81)
RDW: 13.1 % (ref 11.5–15.5)
WBC: 8.3 10*3/uL (ref 4.0–10.5)
nRBC: 0 % (ref 0.0–0.2)

## 2022-10-03 SURGERY — EXCISION, MASS, HEAD
Anesthesia: General

## 2022-10-03 MED ORDER — LIDOCAINE-EPINEPHRINE 1 %-1:100000 IJ SOLN
INTRAMUSCULAR | Status: DC | PRN
  Administered 2022-10-03: 9 mL

## 2022-10-03 MED ORDER — 0.9 % SODIUM CHLORIDE (POUR BTL) OPTIME
TOPICAL | Status: DC | PRN
  Administered 2022-10-03: 1000 mL

## 2022-10-03 MED ORDER — FENTANYL CITRATE (PF) 250 MCG/5ML IJ SOLN
INTRAMUSCULAR | Status: AC
  Filled 2022-10-03: qty 5

## 2022-10-03 MED ORDER — DEXAMETHASONE SODIUM PHOSPHATE 10 MG/ML IJ SOLN
INTRAMUSCULAR | Status: DC | PRN
  Administered 2022-10-03: 10 mg via INTRAVENOUS

## 2022-10-03 MED ORDER — BSS IO SOLN
INTRAOCULAR | Status: AC
  Filled 2022-10-03: qty 15

## 2022-10-03 MED ORDER — BSS IO SOLN
INTRAOCULAR | Status: DC | PRN
  Administered 2022-10-03: 15 mL

## 2022-10-03 MED ORDER — BACITRACIN ZINC 500 UNIT/GM EX OINT
TOPICAL_OINTMENT | CUTANEOUS | Status: AC
  Filled 2022-10-03: qty 28.35

## 2022-10-03 MED ORDER — PHENYLEPHRINE 80 MCG/ML (10ML) SYRINGE FOR IV PUSH (FOR BLOOD PRESSURE SUPPORT)
PREFILLED_SYRINGE | INTRAVENOUS | Status: DC | PRN
  Administered 2022-10-03: 80 ug via INTRAVENOUS

## 2022-10-03 MED ORDER — LACTATED RINGERS IV SOLN: INTRAVENOUS | Status: DC

## 2022-10-03 MED ORDER — PROPOFOL 10 MG/ML IV BOLUS
INTRAVENOUS | Status: AC
  Filled 2022-10-03: qty 20

## 2022-10-03 MED ORDER — ONDANSETRON HCL 4 MG/2ML IJ SOLN
INTRAMUSCULAR | Status: AC
  Filled 2022-10-03: qty 2

## 2022-10-03 MED ORDER — CHLORHEXIDINE GLUCONATE 0.12 % MT SOLN: 15.0000 mL | Freq: Once | OROMUCOSAL | Status: AC

## 2022-10-03 MED ORDER — LIDOCAINE 2% (20 MG/ML) 5 ML SYRINGE
INTRAMUSCULAR | Status: AC
  Filled 2022-10-03: qty 5

## 2022-10-03 MED ORDER — ROCURONIUM BROMIDE 10 MG/ML (PF) SYRINGE
PREFILLED_SYRINGE | INTRAVENOUS | Status: AC
  Filled 2022-10-03: qty 10

## 2022-10-03 MED ORDER — ACETAMINOPHEN 500 MG PO TABS: 1000.0000 mg | ORAL_TABLET | Freq: Once | ORAL | Status: AC

## 2022-10-03 MED ORDER — FENTANYL CITRATE (PF) 250 MCG/5ML IJ SOLN
INTRAMUSCULAR | Status: DC | PRN
  Administered 2022-10-03 (×4): 50 ug via INTRAVENOUS

## 2022-10-03 MED ORDER — MIDAZOLAM HCL 2 MG/2ML IJ SOLN
INTRAMUSCULAR | Status: DC | PRN
  Administered 2022-10-03: 2 mg via INTRAVENOUS

## 2022-10-03 MED ORDER — MIDAZOLAM HCL 2 MG/2ML IJ SOLN
INTRAMUSCULAR | Status: AC
  Filled 2022-10-03: qty 2

## 2022-10-03 MED ORDER — SUCCINYLCHOLINE CHLORIDE 200 MG/10ML IV SOSY
PREFILLED_SYRINGE | INTRAVENOUS | Status: AC
  Filled 2022-10-03: qty 10

## 2022-10-03 MED ORDER — DEXAMETHASONE SODIUM PHOSPHATE 10 MG/ML IJ SOLN
INTRAMUSCULAR | Status: AC
  Filled 2022-10-03: qty 1

## 2022-10-03 MED ORDER — PROPOFOL 10 MG/ML IV BOLUS
INTRAVENOUS | Status: DC | PRN
  Administered 2022-10-03: 150 mg via INTRAVENOUS

## 2022-10-03 MED ORDER — PHENYLEPHRINE HCL-NACL 20-0.9 MG/250ML-% IV SOLN
INTRAVENOUS | Status: DC | PRN
  Administered 2022-10-03: 25 ug/min via INTRAVENOUS

## 2022-10-03 MED ORDER — BACITRACIN ZINC 500 UNIT/GM EX OINT
TOPICAL_OINTMENT | CUTANEOUS | Status: DC | PRN
  Administered 2022-10-03: 1 via TOPICAL

## 2022-10-03 MED ORDER — OXYCODONE HCL 5 MG PO TABS: 5.0000 mg | ORAL_TABLET | Freq: Four times a day (QID) | ORAL | 0 refills | Status: AC | PRN

## 2022-10-03 MED ORDER — LIDOCAINE 2% (20 MG/ML) 5 ML SYRINGE
INTRAMUSCULAR | Status: DC | PRN
  Administered 2022-10-03: 40 mg via INTRAVENOUS

## 2022-10-03 MED ORDER — FENTANYL CITRATE (PF) 100 MCG/2ML IJ SOLN: 25.0000 ug | INTRAMUSCULAR | Status: DC | PRN

## 2022-10-03 MED ORDER — CEFAZOLIN SODIUM-DEXTROSE 2-4 GM/100ML-% IV SOLN
2.0000 g | INTRAVENOUS | Status: AC
  Administered 2022-10-03: 2 g via INTRAVENOUS
  Filled 2022-10-03: qty 100

## 2022-10-03 MED ORDER — LIDOCAINE-EPINEPHRINE 1 %-1:100000 IJ SOLN
INTRAMUSCULAR | Status: AC
  Filled 2022-10-03: qty 1

## 2022-10-03 MED ORDER — ONDANSETRON HCL 4 MG/2ML IJ SOLN
INTRAMUSCULAR | Status: DC | PRN
  Administered 2022-10-03: 4 mg via INTRAVENOUS

## 2022-10-03 MED ORDER — ACETAMINOPHEN 500 MG PO TABS
ORAL_TABLET | ORAL | Status: AC
  Administered 2022-10-03: 1000 mg via ORAL
  Filled 2022-10-03: qty 2

## 2022-10-03 MED ORDER — ORAL CARE MOUTH RINSE: 15.0000 mL | Freq: Once | OROMUCOSAL | Status: AC

## 2022-10-03 MED ORDER — CHLORHEXIDINE GLUCONATE 0.12 % MT SOLN
OROMUCOSAL | Status: AC
  Administered 2022-10-03: 15 mL via OROMUCOSAL
  Filled 2022-10-03: qty 15

## 2022-10-03 SURGICAL SUPPLY — 68 items
ADH SKN CLS APL DERMABOND .7 (GAUZE/BANDAGES/DRESSINGS)
AGENT HMST KT MTR STRL THRMB (HEMOSTASIS)
BAG COUNTER SPONGE SURGICOUNT (BAG) ×1 IMPLANT
BAG SPNG CNTER NS LX DISP (BAG)
BETADINE 5% OPHTHALMIC (OPHTHALMIC) ×2 IMPLANT
BLADE SURG 15 STRL LF DISP TIS (BLADE) ×1 IMPLANT
BLADE SURG 15 STRL SS (BLADE) ×1
CANISTER SUCT 3000ML PPV (MISCELLANEOUS) ×1 IMPLANT
CLEANER TIP ELECTROSURG 2X2 (MISCELLANEOUS) ×1 IMPLANT
CNTNR URN SCR LID CUP LEK RST (MISCELLANEOUS) ×1 IMPLANT
CONT SPEC 4OZ STRL OR WHT (MISCELLANEOUS)
CORD BIPOLAR FORCEPS 12FT (ELECTRODE) IMPLANT
COVER SURGICAL LIGHT HANDLE (MISCELLANEOUS) ×1 IMPLANT
DERMABOND ADVANCED .7 DNX12 (GAUZE/BANDAGES/DRESSINGS) ×1 IMPLANT
DRAPE HALF SHEET 40X57 (DRAPES) ×1 IMPLANT
DRESSING NASAL POPE 10X1.5X2.5 (GAUZE/BANDAGES/DRESSINGS) ×1 IMPLANT
DRSG GLASSCOCK MASTOID ADT (GAUZE/BANDAGES/DRESSINGS) IMPLANT
DRSG NASAL POPE 10X1.5X2.5 (GAUZE/BANDAGES/DRESSINGS)
DRSG TEGADERM 2-3/8X2-3/4 SM (GAUZE/BANDAGES/DRESSINGS) ×1 IMPLANT
DRSG TELFA 3X8 NADH STRL (GAUZE/BANDAGES/DRESSINGS) ×1 IMPLANT
ELECT COATED BLADE 2.86 ST (ELECTRODE) ×1 IMPLANT
ELECT REM PT RETURN 9FT ADLT (ELECTROSURGICAL) ×1
ELECTRODE REM PT RTRN 9FT ADLT (ELECTROSURGICAL) ×1 IMPLANT
FORCEPS BIPOLAR SPETZLER 8 1.0 (NEUROSURGERY SUPPLIES) IMPLANT
GAUZE 4X4 16PLY ~~LOC~~+RFID DBL (SPONGE) ×1 IMPLANT
GAUZE XEROFORM 1X8 LF (GAUZE/BANDAGES/DRESSINGS) ×1 IMPLANT
GAUZE XEROFORM 5X9 LF (GAUZE/BANDAGES/DRESSINGS) IMPLANT
GLOVE BIO SURGEON STRL SZ7.5 (GLOVE) ×1 IMPLANT
GLOVE BIOGEL PI IND STRL 8 (GLOVE) ×1 IMPLANT
GOWN STRL REUS W/ TWL LRG LVL3 (GOWN DISPOSABLE) ×1 IMPLANT
GOWN STRL REUS W/ TWL XL LVL3 (GOWN DISPOSABLE) ×1 IMPLANT
GOWN STRL REUS W/TWL LRG LVL3 (GOWN DISPOSABLE) ×1
GOWN STRL REUS W/TWL XL LVL3 (GOWN DISPOSABLE) ×1
KIT BASIN OR (CUSTOM PROCEDURE TRAY) ×1 IMPLANT
KIT TURNOVER KIT B (KITS) ×1 IMPLANT
MARKER SKIN DUAL TIP RULER LAB (MISCELLANEOUS) ×1 IMPLANT
NDL 27GX1/2 REG BEVEL ECLIP (NEEDLE) ×1 IMPLANT
NDL HYPO 30X.5 LL (NEEDLE) ×1 IMPLANT
NDL PRECISIONGLIDE 27X1.5 (NEEDLE) ×1 IMPLANT
NEEDLE 27GX1/2 REG BEVEL ECLIP (NEEDLE) ×1 IMPLANT
NEEDLE HYPO 30X.5 LL (NEEDLE) ×1 IMPLANT
NEEDLE PRECISIONGLIDE 27X1.5 (NEEDLE) ×1 IMPLANT
NS IRRIG 1000ML POUR BTL (IV SOLUTION) ×1 IMPLANT
OPHTHALMIC BETADINE 5% (OPHTHALMIC) ×2
PAD ARMBOARD 7.5X6 YLW CONV (MISCELLANEOUS) ×2 IMPLANT
PENCIL SMOKE EVACUATOR (MISCELLANEOUS) ×1 IMPLANT
POSITIONER HEAD DONUT 9IN (MISCELLANEOUS) ×1 IMPLANT
SPONGE T-LAP 18X18 ~~LOC~~+RFID (SPONGE) ×1 IMPLANT
STAPLER VISISTAT 35W (STAPLE) ×1 IMPLANT
SURGIFLO W/THROMBIN 8M KIT (HEMOSTASIS) IMPLANT
SUT CHROMIC 5 0 P 3 (SUTURE) IMPLANT
SUT ETHILON 5 0 PS 2 18 (SUTURE) IMPLANT
SUT ETHILON 6 0 9-3 1X18 BLK (SUTURE) IMPLANT
SUT MON AB 5-0 P3 18 (SUTURE) IMPLANT
SUT PDS AB 4-0 P3 18 (SUTURE) IMPLANT
SUT PLAIN 5 0 P 3 18 (SUTURE) IMPLANT
SUT SILK 4 0 TF CR/8 (SUTURE) IMPLANT
SUT VIC AB 3-0 X1 27 (SUTURE) IMPLANT
SUT VIC AB 4-0 PS2 18 (SUTURE) IMPLANT
SUT VIC AB 4-0 PS2 27 (SUTURE) IMPLANT
SUT VICRYL 4-0 PS2 18IN ABS (SUTURE) IMPLANT
SUT VICRYL RAPIDE 4/0 PS 2 (SUTURE) IMPLANT
SYR CONTROL 10ML LL (SYRINGE) ×1 IMPLANT
TOWEL GREEN STERILE (TOWEL DISPOSABLE) ×1 IMPLANT
TOWEL GREEN STERILE FF (TOWEL DISPOSABLE) ×1 IMPLANT
TRAY ENT MC OR (CUSTOM PROCEDURE TRAY) ×1 IMPLANT
TUBE CONNECTING 12X1/4 (SUCTIONS) ×1 IMPLANT
WATER STERILE IRR 1000ML POUR (IV SOLUTION) ×1 IMPLANT

## 2022-10-03 NOTE — Anesthesia Procedure Notes (Signed)
Procedure Name: LMA Insertion Date/Time: 10/03/2022 12:02 PM  Performed by: Dorie Rank, CRNAPre-anesthesia Checklist: Patient identified, Emergency Drugs available, Patient being monitored, Suction available and Timeout performed Patient Re-evaluated:Patient Re-evaluated prior to induction Oxygen Delivery Method: Circle system utilized Preoxygenation: Pre-oxygenation with 100% oxygen Induction Type: IV induction Ventilation: Mask ventilation without difficulty LMA: LMA inserted LMA Size: 4.0 Number of attempts: 1 Placement Confirmation: breath sounds checked- equal and bilateral and positive ETCO2 Tube secured with: Tape Dental Injury: Teeth and Oropharynx as per pre-operative assessment

## 2022-10-03 NOTE — Op Note (Signed)
FACIAL PLASTIC SURGERY OPERATIVE NOTE  Don [name] Date/Time of Admission: 10/03/2022 10:13 AM  CSN: [id number];MRN:[id number] Attending Provider: Scarlette Ar, MD Room/Bed: MCPO/NONE DOB: 10/07/1955 Age: 67 y.o.   Pre-Op Diagnosis: Acquired Left Cheek and Right ear deformity; Mohs defect of Lt Cheek and Rt. ear; Squamous cell carcinoma; History of nonmelanoma skin cancer,  Post-Op Diagnosis: Acquired Left Cheek and Right ear deformity; Mohs defect of Lt Cheek and Rt. ear; Squamous cell carcinoma; History of nonmelanoma skin cancer,  Procedure:  Adjacent tissue transfer left cheek with laterally based rotational advancement note flap 3x2cm (14040) Full thickness skin graft from left neck to left eyelid 2x2.5cm (15240) Full thickness skin graft from left neck to right ear 1x1cm (15240) Adjacent tissue transfer right ear with inferiorly based rotational advancement flap 3x2cm (14060) Excision wound head <100cm2 in preparation for flaps and grafts (15004)  Anesthesia: General  Surgeon(s): Mervin Kung, MD  Staff: Circulator: Heriberto Antigua, RN Relief Circulator: Jeronimo Greaves, RN Relief Scrub: Julieta Bellini, RN Scrub Person: Jeronimo Greaves, RN  Implants: * No implants in log *  Specimens: * No specimens in log *  Complications: none  EBL: 20 ML  IVF: Per anesthesia ML  Condition: stable  Operative Findings:  2x2.5cm anterior lamella mohs defect left lower eyelid 12x40mm left lateral lower eyelid anterior lamella/cheek defect Granulating wounds from right ear reconstruction with completely detached central auricle   Description of Operation:  The patient was identified in the preoperative area and consent confirmed in the chart.  He was brought to the operating room by the anesthetist and a preoperative huddle was performed confirming patient identity and procedure to be performed.  Once all were in agreement we proceed with surgery.  General  anesthesia was induced and the patient intubated with a laryngeal mask airway.    The wound beds were anesthetized with lidocaine and epinephrine.  We then prepped and draped patient in standard fashion for procedure of this kind.  A final preoperative pause was performed and we proceeded with surgery.  We began with excision and debridement of the eyelid wounds and right ear wound for inset of the flap and skin grafts; wound edges were sharply cleansed and a subcutaneous plane was gently developed several millimeters circumferentially to allow for tension-free closure.  Hemostasis was achieved with the Bovie.    First to reconstruct the left lateral eyelid/cheek defect a laterally based note flap rotational advancement flap was designed with a initial limb perpendicular to the wound in the relaxed skin tension line approximately 1-1/2 times the diameter of the defect (total 17mm) with a second limb angled at 60 degrees, the length of the diameter (11mm).  15 blade was used to create the note flap incisions and a plane was dissected above the level of the orbicularis oculi musculature.  The circumferential wound bed was dissected in a subcutaneous plane.  The flap was then inset with buried interrupted 5-0 Monocryl sutures and interrupted 6-0 nylon sutures laterally and interrupted 5-0 plain gut suture by the eyelid.  Next to reconstruct the larger left lower eyelid anterior lamellar defect 2 x 2.5 cm a full-thickness skin graft was harvested from the left supraclavicular crease.  Approximately 3 x 2 cm fusiform incision was marked out, infiltrated with lidocaine epinephrine and a 15 blade was used to harvest the full-thickness skin graft.  Dissection in a subcutaneous plane was performed circumferentially to allow for tension-free closure.  Hemostasis was achieved achieved.  The wound was closed  in a layered closure with 4-0 Vicryl for the deep layer and running locking 5-0 nylon for skin.  The full-thickness  skin graft was then defatted and trimmed and contoured and inset into the left lower eyelid defect site 2 x 2.5 cm using 5-0 Chromic Gut sutures.  2 central tacking sutures to the orbicularis oculi muscle was performed with 4-0 Vicryl Rapide sutures.    A separate 1 cm x 1 cm piece of full-thickness skin graft was then inset into a portion of the right auricular wound to help reconstruction of the defect.  Next to reconstruct the complex right ear deformity the superior boundary of the lower lobule and the inferior boundary of the superior auricle were marked out and a 15 blade was used to sharply excise the wound edges full-thickness and dissection in a subcutaneous plane to allow for an inferiorly based rotational advancement flap 3 x 2 cm to allow for reconstruction of the entire ear and reattachment of the central portion.  After the wounds were sharply incised and the visible cartilage was removed a layered closure was performed using buried interrupted 5-0 PDS for the cartilage and deep layer followed by a combination of interrupted 5-0 nylon and 5-0 Chromic Gut for the skin layer.  This rotated the lobular portion superiorly and a rotational advancement 3 x 2 cm allowing for great improvement in the aesthetic deformity of the ear and reconstruction of the helix/antihelix.  All the wounds were copiously cleansed.  The wounds were dressed with bacitracin, Telfa and brown paper tape.  A Glasscock dressing was placed on the right ear.  A Xeroform bolster with 4-0 silk was sutured to the left lower eyelid to improve viability of the full-thickness skin graft.  The patient was then turned back to the anesthetist extubated and brought to recovery room in stable condition.   Mervin Kung, MD [facility] ENT  10/03/2022

## 2022-10-03 NOTE — Transfer of Care (Signed)
Immediate Anesthesia Transfer of Care Note  Patient: Don [name]  Procedure(s) Performed: EXCISION WOUNDHEAD LESS THAN 100 CM^2 FOR FLAP ADJACENT TISSUE TRANSFER LEFT CHEEK AND RIGHT EAR, FULL THICKNESS SKIN GRAFT LEFT NECK TO LEFT LOWER EYELID AND RIGHT EAR  Patient Location: PACU  Anesthesia Type:General  Level of Consciousness: awake, alert , and oriented  Airway & Oxygen Therapy: Patient connected to nasal cannula oxygen  Post-op Assessment: Post -op Vital signs reviewed and stable  Post vital signs: stable  Last Vitals:  Vitals Value Taken Time  BP 156/103 10/03/22 1345  Temp    Pulse 99 10/03/22 1347  Resp 13 10/03/22 1347  SpO2 95 % 10/03/22 1347  Vitals shown include unvalidated device data.  Last Pain:  Vitals:   10/03/22 1031  TempSrc: Oral  PainSc: 0-No pain         Complications: No notable events documented.

## 2022-10-03 NOTE — H&P (Signed)
Don [name] is an 67 y.o. male.    Chief Complaint:  Mohs defect of left cheek and eyelid, acquired deformity right ear  HPI: Patient presents today for planned elective procedure.  Hedenies any interval change in history since office visit on 09/30/22.  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   Skin cancer     Past Surgical History:  Procedure Laterality Date   cartilage removed from both knees     EXCISION MASS HEAD Right 08/28/2022   Procedure: EXCISION OF EAR WOUND;  Surgeon: Scarlette Ar, MD;  Location: [facility] OR;  Service: ENT;  Laterality: Right;   KNEE ARTHROSCOPY     left wrist     SKIN CANCER EXCISION     SKIN FULL THICKNESS GRAFT Right 08/28/2022   Procedure: RIGHT EXCISION OF EAR WOUND, ADJACENT TISSUE TRANSFER, RIGHT SKIN GRAFT FROM NECK TO EAR;  Surgeon: Scarlette Ar, MD;  Location: [facility] OR;  Service: ENT;  Laterality: Right;    Family History  Problem Relation Age of Onset   Cancer Father        Skin   Diabetes Father    Hypertension Father     Social History:  reports that he has been smoking cigarettes. He has a 15.00 pack-year smoking history. He has been exposed to tobacco smoke. He has never used smokeless tobacco. He reports that he does not drink alcohol and does not use drugs.  Allergies: No Known Allergies  Medications Prior to Admission  Medication Sig Dispense Refill   acetaminophen (TYLENOL) 500 MG tablet Take 1 tablet (500 mg total) by mouth every 6 (six) hours as needed. 30 tablet 0   albuterol (VENTOLIN HFA) 108 (90 Base) MCG/ACT inhaler Inhale 1-2 puffs into the lungs every 4 (four)  hours as needed for wheezing or shortness of breath. 1 each 2   aspirin 81 MG chewable tablet Chew 1 tablet (81 mg total) by mouth daily.     diphenhydrAMINE (BENADRYL) 25 MG tablet Take 25 mg by mouth daily as needed for allergies or itching.     ibuprofen (ADVIL) 200 MG tablet Take 800 mg by mouth every 6 (six) hours as needed for moderate pain.     losartan-hydrochlorothiazide (HYZAAR) 100-12.5 MG tablet Take 1 tablet by mouth daily. 30 tablet 2   Multiple Vitamins-Minerals (MULTIVITAMIN WITH MINERALS) tablet Take 1 tablet by mouth daily.     mupirocin ointment (BACTROBAN) 2 % Apply 1 Application topically 3 (three) times daily.     tamsulosin (FLOMAX) 0.4 MG CAPS capsule Take 1 capsule (0.4 mg total) by mouth daily. 30 capsule 2   benzonatate (TESSALON) 100 MG capsule Take 1 capsule (100 mg total) by mouth 2 (two) times daily as needed for cough. (Patient not taking: Reported on 10/01/2022) 20 capsule 0   oxyCODONE (ROXICODONE) 5 MG immediate release tablet Take 1 tablet (5 mg total) by mouth every 6 (six) hours as needed for severe pain or breakthrough pain (Pain not relieved by Tylenol). (Patient not taking: Reported on 09/26/2022) 15 tablet 0    No  results found for this or any previous visit (from the past 48 hour(s)). No results found.  ROS: negative other than stated in HPI  Blood pressure (!) 145/99, pulse 93, temperature 98.1 F (36.7 C), temperature source Oral, resp. rate 18, height 5\' 10"  (1.778 m), weight 86.6 kg, SpO2 97 %.  PHYSICAL EXAM: General: Resting comfortably in NAD  Lungs: Non-labored respiratinos  Studies Reviewed: none   Assessment/Plan Mohs defect of left cheek and eyelid Acquired deformity of right ear Open wound right ear History of squamous cell carcinoma of right ear  Proceed with repair mohs defect of left cheek and eyelid with full thickness skin grafts from the left neck. Repair deformity of left ear with complex repair. Informed consent obtained.  R/B/A discussed.    Electronically signed by:  Scarlette Ar, MD  Staff Physician Facial Plastic & Reconstructive Surgery Otolaryngology - Head and Neck Surgery [facility]  10/03/2022, 10:50 AM

## 2022-10-03 NOTE — Anesthesia Postprocedure Evaluation (Signed)
Anesthesia Post Note  Patient: Don [name]  Procedure(s) Performed: EXCISION WOUNDHEAD LESS THAN 100 CM^2 FOR FLAP ADJACENT TISSUE TRANSFER LEFT CHEEK AND RIGHT EAR, FULL THICKNESS SKIN GRAFT LEFT NECK TO LEFT LOWER EYELID AND RIGHT EAR     Patient location during evaluation: PACU Anesthesia Type: General Level of consciousness: awake and alert Pain management: pain level controlled Vital Signs Assessment: post-procedure vital signs reviewed and stable Respiratory status: spontaneous breathing, nonlabored ventilation, respiratory function stable and patient connected to nasal cannula oxygen Cardiovascular status: blood pressure returned to baseline and stable Postop Assessment: no apparent nausea or vomiting Anesthetic complications: no  No notable events documented.  Last Vitals:  Vitals:   10/03/22 1035 10/03/22 1345  BP: (!) 145/99 (!) 156/103  Pulse:  (!) 103  Resp:  20  Temp:  36.9 C  SpO2:  98%    Last Pain:  Vitals:   10/03/22 1345  TempSrc:   PainSc: 4                  Louiza Moor S

## 2022-10-03 NOTE — Discharge Instructions (Signed)
Post-operative Patient Instructions Don [name] G. Braden Deloach MD  Surgery What to expect: A bandage will be placed on your surgical sites. You can leave the bandages in place until you return to the clinic. You may be scheduled for a series of wound care appointments over the next month.  Recovery/Restrictions: -No strenuous activity for at least the first week after your procedure -Bruising and swelling are expected and will take weeks to go away (consider using ice packs) -Please contact our office immediately if you experience any signs/symptoms of infection (redness, pain, or fever of 100.4F or greater)  Wound Wound care: The goal is to keep your wounds clean and moist to prevent scabs or crusts. You will keep your current post-operative dressing in place undisturbed until after your first post-operative clinic visit. The following instructions apply after your first post-operative visit.  1. Clean wound wounds with soap and water using a cue tip if any crusts are present  2. Next, apply a thin layer of Aquaphor ointment 3. Apply Telfa and cover with brown tape  4. Please apply a thin layer of antibiotic ointment or Aquaphor ointment to your ear incision every day  Care Healing Period: For the best healing, please protect the area from the sun   You may be asked to begin massaging the scars several weeks after surgery. Scars can be massaged in horizontal, vertical, and circular motions.   Adult Post-Operative Pain Management  Pain medication is given immediately following your surgery to help with post-operative pain. Do not wake up or set an alarm to wake up and take pain medications. Sleep and rest.  Upon your discharge home, we suggest scheduled doses of Acetaminophen (Tylenol) every 6 hours and Ibuprofen (Motrin) every 6 hours, alternating between medications every 3 hours (i.e. Take Tylenol and wait 3 hours, then take Motrin and wait 3 hours, repeat) for the first 3-4 days after  surgery. If you are without significant pain, medications can be taken more infrequently. It is important to follow dosing instructions on the medication bottle or prescription.   Sample of medication dosing schedule  Give dose of: Time: Given:  Acetaminophen 12 a.m.   Ibuprofen 3 a.m.   Acetaminophen 6 a.m.   Ibuprofen 9 a.m.   Acetaminophen 12 p.m.   Ibuprofen 3 p.m.   Acetaminophen 6 p.m.   Ibuprofen 9 p.m.    If you need to call after clinic hours for a concern, call [phone] and ask for the "physician on call for ENT."  [address] [place], [place] [address] Phone: [phone]   

## 2022-10-04 ENCOUNTER — Encounter (HOSPITAL_COMMUNITY): Payer: Self-pay | Admitting: Otolaryngology

## 2022-10-08 DIAGNOSIS — M952 Other acquired deformity of head: Secondary | ICD-10-CM | POA: Insufficient documentation

## 2022-11-11 ENCOUNTER — Ambulatory Visit: Payer: Medicare PPO | Admitting: Family Medicine

## 2022-11-14 DIAGNOSIS — L578 Other skin changes due to chronic exposure to nonionizing radiation: Secondary | ICD-10-CM | POA: Diagnosis not present

## 2022-11-14 DIAGNOSIS — D0422 Carcinoma in situ of skin of left ear and external auricular canal: Secondary | ICD-10-CM | POA: Diagnosis not present

## 2022-11-14 DIAGNOSIS — L570 Actinic keratosis: Secondary | ICD-10-CM | POA: Diagnosis not present

## 2022-11-18 ENCOUNTER — Other Ambulatory Visit: Payer: Self-pay | Admitting: Family Medicine

## 2022-11-18 DIAGNOSIS — I10 Essential (primary) hypertension: Secondary | ICD-10-CM

## 2023-02-07 ENCOUNTER — Other Ambulatory Visit: Payer: Self-pay

## 2023-02-07 DIAGNOSIS — R351 Nocturia: Secondary | ICD-10-CM

## 2023-02-07 MED ORDER — TAMSULOSIN HCL 0.4 MG PO CAPS
0.4000 mg | ORAL_CAPSULE | Freq: Every day | ORAL | 3 refills | Status: DC
Start: 2023-02-07 — End: 2023-06-02

## 2023-03-14 ENCOUNTER — Ambulatory Visit: Payer: Medicare PPO | Admitting: Family Medicine

## 2023-03-14 ENCOUNTER — Encounter: Payer: Self-pay | Admitting: Family Medicine

## 2023-03-14 VITALS — BP 130/74 | HR 61 | Temp 98.0°F | Ht 70.0 in | Wt 188.0 lb

## 2023-03-14 DIAGNOSIS — Z1331 Encounter for screening for depression: Secondary | ICD-10-CM

## 2023-03-14 DIAGNOSIS — R0981 Nasal congestion: Secondary | ICD-10-CM | POA: Diagnosis not present

## 2023-03-14 DIAGNOSIS — J069 Acute upper respiratory infection, unspecified: Secondary | ICD-10-CM | POA: Diagnosis not present

## 2023-03-14 NOTE — Progress Notes (Signed)
Subjective:  Patient ID: Don [name], male    DOB: 19-Apr-1956  Age: 67 y.o. MRN: [id number]  CC:  Chief Complaint  Patient presents with   Cough    Drainage, weakness, started on Monday notes unknown if fever did not check temp. No covid test performed     HPI Don [name] presents for   Cough: Started about 4 days ago with nasal drainage, slight fever few days ago - resolved with advil. No fever past few days, but has been taking nyquil and tylenol sinus.  Feels better during the day, but usually worse in the mornings. Felt a little better this morning.  No dyspnea.  No hx of COPD/asthma. Smokes cigarettes - 1/2 ppd. Has cut back. No wheeze.  Did have a sick contact - was around someone at his cousins house, was told that he was sick, but unknown specifics.  Drinking fluids, no chest pain or dyspnea.  No confusion.  No chest congestion, no discolored nasal discharge.   Had flu vaccine in spring.  Covid booster about 6 months ago.  No insomnia, no cough at night.    Immunization History  Administered Date(s) Administered   [employer] Vaccination 01/22/2020   PFIZER(Purple Top)SARS-COV-2 Vaccination 02/01/2020   Tdap 05/11/2013    Depression: Positive screening. Feeling down for years to a certain extent. Has had therapist in the past, no recent meds. Denies SI. Feels like getting along ok and does not want to meet with therapist or start meds at this time. Denies recent changes in mood symptoms.    03/14/2023   11:00 AM 08/09/2022   12:41 PM  GAD 7 : Generalized Anxiety Score  Nervous, Anxious, on Edge 1 0  Control/stop worrying 1 0  Worry too much - different things 1 0  Trouble relaxing 1 0  Restless 1 0  Easily annoyed or irritable 1 0  Afraid - awful might happen 1 0  Total GAD 7 Score 7 0  Anxiety Difficulty  Not difficult at all      03/14/2023   11:00 AM 09/26/2022    1:42 PM 08/09/2022   12:41 PM 10/03/2021   10:44 AM  Depression  screen PHQ 2/9  Decreased Interest 1 0 0 0  Down, Depressed, Hopeless 1 2 0 1  PHQ - 2 Score 2 2 0 1  Altered sleeping 1 0 0 0  Tired, decreased energy 1 0 0 0  Change in appetite 1 0 0 0  Feeling bad or failure about yourself  1 0 0 0  Trouble concentrating 1 0 0 0  Moving slowly or fidgety/restless 1 0 0 0  Suicidal thoughts 1 0 0 0  PHQ-9 Score 9 2 0 1  Difficult doing work/chores  Somewhat difficult Not difficult at all Not difficult at all    History Patient Active Problem List   Diagnosis Date Noted   Acquired deformity of face 10/08/2022   Tobacco abuse counseling 08/09/2022   Acquired deformity of right ear 07/22/2022   Tobacco abuse 07/22/2022   Squamous cell carcinoma, ear, 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   Abdominal pain 06/10/2022   Abnormal CT scan, kidney 06/10/2022   Substance use 06/10/2022   Renal infarct (HCC) 06/10/2022   Aortic atherosclerosis (HCC) 06/10/2022   History of elevated glucose 10/03/2021   Nocturia 10/03/2021   Smokes less than 1 pack a day with greater than 30 pack year history 10/03/2021   Skin cancer of forehead 09/27/2020   Squamous cell  skin cancer, face 09/27/2020   Primary hypertension 02/07/2014   Osteoarthritis 02/07/2014   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   Knee problem 02/07/2014   Osteoarthritis of knee 02/07/2014   Skin abnormalities 02/07/2014   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   Squamous cell carcinoma in situ 03/15/2011   Actinic keratosis 03/15/2011   History of nonmelanoma skin cancer 03/15/2011   Squamous cell carcinoma of skin of face 07/18/2010   Past Medical History:  Diagnosis Date   Arthritis    Cancer (HCC)    squamous cell "all in my face arms and hands"   Chronic pain    UDS+ on 06/09/22 for opiates and amphetamines; pt not currently prescribed any   COPD (chronic obstructive pulmonary disease) (HCC)    Depression    Hypertension    admitted 06/09/22-06/11/22 for hypertensive urgency; ran out of BP meds    Renal infarct (HCC) 06/10/2022   Skin cancer    Past Surgical History:  Procedure Laterality Date   cartilage removed from both knees     EXCISION MASS HEAD Right 08/28/2022   Procedure: EXCISION OF EAR WOUND;  Surgeon: Scarlette Ar, MD;  [facility];  Service: ENT;  Laterality: Right;   EXCISION MASS HEAD N/A 10/03/2022   Procedure: EXCISION WOUNDHEAD LESS THAN 100 CM^2 FOR FLAP;  Surgeon: Scarlette Ar, MD;  Location: [facility] OR;  Service: ENT;  Laterality: N/A;   KNEE ARTHROSCOPY     left wrist     SKIN CANCER EXCISION     SKIN FULL THICKNESS GRAFT Right 08/28/2022   Procedure: RIGHT EXCISION OF EAR WOUND, ADJACENT TISSUE TRANSFER, RIGHT SKIN GRAFT FROM NECK TO EAR;  Surgeon: Scarlette Ar, MD;  Location: [facility] OR;  Service: ENT;  Laterality: Right;   SKIN FULL THICKNESS GRAFT N/A 10/03/2022   Procedure: ADJACENT TISSUE TRANSFER LEFT CHEEK AND RIGHT EAR, FULL THICKNESS SKIN GRAFT LEFT NECK TO LEFT LOWER EYELID AND RIGHT EAR;  Surgeon: Scarlette Ar, MD;  Location: [facility] OR;  Service: ENT;  Laterality: N/A;   No Known Allergies Prior to Admission medications   Medication Sig Start Date End Date Taking? Authorizing Provider  acetaminophen (TYLENOL) 500 MG tablet Take 1 tablet (500 mg total) by mouth every 6 (six) hours as needed. 08/28/22  Yes Scarlette Ar, MD  albuterol (VENTOLIN HFA) 108 (90 Base) MCG/ACT inhaler Inhale 1-2 puffs into the lungs every 4 (four) hours as needed for wheezing or shortness of breath. 06/11/22  Yes Standley Brooking, MD  aspirin 81 MG chewable tablet Chew 1 tablet (81 mg total) by mouth daily. 06/12/22  Yes Standley Brooking, MD  diltiazem (CARDIZEM CD) 120 MG 24 hr capsule Take by mouth. 03/15/11  Yes [provider]  diphenhydrAMINE (BENADRYL) 25 MG tablet Take 25 mg by mouth daily as needed for allergies or itching.   Yes [provider]  ibuprofen (ADVIL) 200 MG tablet Take 800 mg by mouth every 6 (six) hours as needed for moderate pain.   Yes  [provider]  losartan-hydrochlorothiazide Mauri Reading) 100-12.5 MG tablet Take 1 tablet by mouth once daily 11/18/22  Yes Alveria Apley, NP  Multiple Vitamins-Minerals (MULTIVITAMIN WITH MINERALS) tablet Take 1 tablet by mouth daily.   Yes [provider]  tamsulosin (FLOMAX) 0.4 MG CAPS capsule Take 1 capsule (0.4 mg total) by mouth daily. 02/07/23  Yes Alveria Apley, NP  benzonatate (TESSALON) 100 MG capsule Take 1 capsule (100 mg total) by mouth 2 (two) times  daily as needed for cough. Patient not taking: Reported on 10/01/2022 08/09/22   Alveria Apley, NP   Social History   Socioeconomic History   Marital status: Significant Other    Spouse name: Not on file   Number of children: 2   Years of education: Not on file   Highest education level: Some college, no degree  Occupational History   Occupation: Retired  Tobacco Use   Smoking status: Every Day    Current packs/day: 0.50    Average packs/day: 0.5 packs/day for 30.0 years (15.0 ttl pk-yrs)    Types: Cigarettes    Passive exposure: Current   Smokeless tobacco: Never  Vaping Use   Vaping status: Never Used  Substance and Sexual Activity   Alcohol use: No   Drug use: No   Sexual activity: Yes  Other Topics Concern   Not on file  Social History Narrative   Patient lives girlfriend and another gentleman. No pets.    Social Determinants of Health   Financial Resource Strain: Low Risk  (09/26/2022)   Overall Financial Resource Strain (CARDIA)    Difficulty of Paying Living Expenses: Not hard at all  Food Insecurity: Low Risk  (10/11/2022)   [facility]   Hunger Vital Sign    Worried About Running Out of Food in the Last Year: Never true    Ran Out of Food in the Last Year: Never true  Transportation Needs: No Transportation Needs (10/11/2022)   [facility]   Transportation    In the past 12 months, has lack of reliable  transportation kept you from medical appointments, meetings, work or from getting things needed for daily living? : No  Physical Activity: Inactive (09/26/2022)   Exercise Vital Sign    Days of Exercise per Week: 0 days    Minutes of Exercise per Session: 0 min  Stress: No Stress Concern Present (09/26/2022)   [employer] of Occupational Health - Occupational Stress Questionnaire    Feeling of Stress : Not at all  Social Connections: Moderately Integrated (09/26/2022)   Social Connection and Isolation Panel [NHANES]    Frequency of Communication with Friends and Family: Twice a week    Frequency of Social Gatherings with Friends and Family: Twice a week    Attends Religious Services: More than 4 times per year    Active Member of [employer] or Organizations: No    Attends Banker Meetings: Never    Marital Status: Living with partner  Intimate Partner Violence: Not At Risk (09/26/2022)   Humiliation, Afraid, Rape, and Kick questionnaire    Fear of Current or Ex-Partner: No    Emotionally Abused: No    Physically Abused: No    Sexually Abused: No    Review of Systems Per HPI.   Objective:   Vitals:   03/14/23 1100  BP: 130/74  Pulse: 61  Temp: 98 F (36.7 C)  TempSrc: Temporal  SpO2: 99%  Weight: 188 lb (85.3 kg)  Height: 5\' 10"  (1.778 m)     Physical Exam Vitals reviewed.  Constitutional:      Appearance: He is well-developed.  HENT:     Head: Normocephalic and atraumatic.     Right Ear: Tympanic membrane, ear canal and external ear normal. There is impacted cerumen (Small amount of hard cerumen at the external canal, easily lifted with curette and pulled with alligator forceps.  No complications, remainder of canal appears overall  clear with no appreciable effusion behind TM.).     Left Ear: Tympanic membrane, ear canal and external ear normal.     Nose: No rhinorrhea.     Mouth/Throat:     Pharynx: No oropharyngeal exudate or posterior oropharyngeal  erythema.  Eyes:     Conjunctiva/sclera: Conjunctivae normal.     Pupils: Pupils are equal, round, and reactive to light.  Cardiovascular:     Rate and Rhythm: Normal rate and regular rhythm.     Heart sounds: Normal heart sounds. No murmur heard. Pulmonary:     Effort: Pulmonary effort is normal. No respiratory distress.     Breath sounds: Normal breath sounds. No wheezing, rhonchi or rales.  Abdominal:     Palpations: Abdomen is soft.     Tenderness: There is no abdominal tenderness.  Musculoskeletal:     Cervical back: Neck supple.  Lymphadenopathy:     Cervical: No cervical adenopathy.  Skin:    General: Skin is warm and dry.     Findings: No rash.  Neurological:     Mental Status: He is alert and oriented to person, place, and time.  Psychiatric:        Mood and Affect: Mood normal.        Behavior: Behavior normal.     Comments: Good eye contact, appropriate responses, does not appear to be responding to internal stimuli.     Assessment & Plan:  Don [name] is a 67 y.o. male . Congestion of nasal sinus - Plan: CANCELED: POC COVID-19, CANCELED: POCT Influenza A/B Upper respiratory tract infection, unspecified type  -Suspected viral illness, differential includes influenza, COVID-19 or other virus, but he declines testing at this time.  Vital signs and exam are reassuring, and currently on day 4 of symptoms, Tamiflu would not be indicated and potential risks versus benefits of Paxlovid discussed and he would decline that medication anyway at this point.  I think is reasonable he continues with symptomatic care, discussed saline nasal spray, antipyretics, Mucinex with RTC/ER precautions given.  Fluids, rest, contact precautions given per CDC with avoidance of others until fever free for 24 hours off antipyretics and symptoms are improving.  Positive depression screening  -Reports long-term intermittent depression symptoms, denies any recent changes and on my questioning  he denies any suicidal ideation.  At this point he declines any medication or counseling but handout was given on management of depression and encouraged him to follow-up with his primary provider to discuss the symptoms and plan further.  All questions answered.     No orders of the defined types were placed in this encounter.  Patient Instructions  As we discussed I do think you have a virus, including possible covid or flu infection, but unable to tell you definitely without a test.  However as we discussed I do not think that is absolutely necessary at this time if we are not going to change any treatment or new medicines based on those results.  I think it is reasonable to continue Tylenol as needed for body aches or fever although you do not have a fever here today.  Mucinex over-the-counter can be helpful for cough or congestion if needed and salt water nasal spray or saline nasal spray can help with nasal congestion with minimal side effects.  Continue to drink plenty of fluids and rest, and I expect you to be improving into next week.  If any persistent discolored nasal discharge, worsening cough, congestion we are happy  to see you again to discuss other treatments but again I expect you to be improving soon.  If any chest pains, shortness of breath, especially at rest, confusion or difficulty drinking fluids, or other acute worsening symptoms be seen right away.  I do not expect that to occur.  Please follow-up with your primary provider to discuss the depression symptoms as there are different ways to treat those symptoms, including various medications, and counseling can be helpful.  I have included some information below on managing some depression symptoms without medication, but please follow-up with Mardene Celeste to discuss further.  Thanks for coming in today and take care.   Upper Respiratory Infection, Adult An upper respiratory infection (URI) is a common viral infection of the nose,  throat, and upper air passages that lead to the lungs. The most common type of URI is the common cold. URIs usually get better on their own, without medical treatment. What are the causes? A URI is caused by a virus. You may catch a virus by: Breathing in droplets from an infected person's cough or sneeze. Touching something that has been exposed to the virus (is contaminated) and then touching your mouth, nose, or eyes. What increases the risk? You are more likely to get a URI if: You are very young or very old. You have close contact with others, such as at work, school, or a health care facility. You smoke. You have long-term (chronic) heart or lung disease. You have a weakened disease-fighting system (immune system). You have nasal allergies or asthma. You are experiencing a lot of stress. You have poor nutrition. What are the signs or symptoms? A URI usually involves some of the following symptoms: Runny or stuffy (congested) nose. Cough. Sneezing. Sore throat. Headache. Fatigue. Fever. Loss of appetite. Pain in your forehead, behind your eyes, and over your cheekbones (sinus pain). Muscle aches. Redness or irritation of the eyes. Pressure in the ears or face. How is this diagnosed? This condition may be diagnosed based on your medical history and symptoms, and a physical exam. Your health care provider may use a swab to take a mucus sample from your nose (nasal swab). This sample can be tested to determine what virus is causing the illness. How is this treated? URIs usually get better on their own within 7-10 days. Medicines cannot cure URIs, but your health care provider may recommend certain medicines to help relieve symptoms, such as: Over-the-counter cold medicines. Cough suppressants. Coughing is a type of defense against infection that helps to clear the respiratory system, so take these medicines only as recommended by your health care provider. Fever-reducing  medicines. Follow these instructions at home: Activity Rest as needed. If you have a fever, stay home from work or school until your fever is gone or until your health care provider says your URI cannot spread to other people (is no longer contagious). Your health care provider may have you wear a face mask to prevent your infection from spreading. Relieving symptoms Gargle with a mixture of salt and water 3-4 times a day or as needed. To make salt water, completely dissolve -1 tsp (3-6 g) of salt in 1 cup (237 mL) of warm water. Use a cool-mist humidifier to add moisture to the air. This can help you breathe more easily. Eating and drinking  Drink enough fluid to keep your urine pale yellow. Eat soups and other clear broths. General instructions  Take over-the-counter and prescription medicines only as told by your health  care provider. These include cold medicines, fever reducers, and cough suppressants. Do not use any products that contain nicotine or tobacco. These products include cigarettes, chewing tobacco, and vaping devices, such as e-cigarettes. If you need help quitting, ask your health care provider. Stay away from secondhand smoke. Stay up to date on all immunizations, including the yearly (annual) flu vaccine. Keep all follow-up visits. This is important. How to prevent the spread of infection to others URIs can be contagious. To prevent the infection from spreading: Wash your hands with soap and water for at least 20 seconds. If soap and water are not available, use hand sanitizer. Avoid touching your mouth, face, eyes, or nose. Cough or sneeze into a tissue or your sleeve or elbow instead of into your hand or into the air.  Contact a health care provider if: You are getting worse instead of better. You have a fever or chills. Your mucus is brown or red. You have yellow or brown discharge coming from your nose. You have pain in your face, especially when you bend  forward. You have swollen neck glands. You have pain while swallowing. You have white areas in the back of your throat. Get help right away if: You have shortness of breath that gets worse. You have severe or persistent: Headache. Ear pain. Sinus pain. Chest pain. You have chronic lung disease along with any of the following: Making high-pitched whistling sounds when you breathe, most often when you breathe out (wheezing). Prolonged cough (more than 14 days). Coughing up blood. A change in your usual mucus. You have a stiff neck. You have changes in your: Vision. Hearing. Thinking. Mood. These symptoms may be an emergency. Get help right away. Call 911. Do not wait to see if the symptoms will go away. Do not drive yourself to the hospital. Summary An upper respiratory infection (URI) is a common infection of the nose, throat, and upper air passages that lead to the lungs. A URI is caused by a virus. URIs usually get better on their own within 7-10 days. Medicines cannot cure URIs, but your health care provider may recommend certain medicines to help relieve symptoms. This information is not intended to replace advice given to you by your health care provider. Make sure you discuss any questions you have with your health care provider. Document Revised: 12/20/2020 Document Reviewed: 12/20/2020 Elsevier Patient Education  2024 Elsevier Inc.  Managing Depression, Adult Depression is a mental health condition that affects your thoughts, feelings, and actions. Being diagnosed with depression can bring you relief if you did not know why you have felt or behaved a certain way. It could also leave you feeling overwhelmed. Finding ways to manage your symptoms can help you feel more positive about your future. How to manage lifestyle changes Being depressed is difficult. Depression can increase the level of everyday stress. Stress can make depression symptoms worse. You may believe your  symptoms cannot be managed or will never improve. However, there are many things you can try to help manage your symptoms. There is hope. Managing stress  Stress is your body's reaction to life changes and events, both good and bad. Stress can add to your feelings of depression. Learning to manage your stress can help lessen your feelings of depression. Try some of the following approaches to reducing your stress (stress reduction techniques): Listen to music that you enjoy and that inspires you. Try using a meditation app or take a meditation class. Develop a practice that  helps you connect with your spiritual self. Walk in nature, pray, or go to a place of worship. Practice deep breathing. To do this, inhale slowly through your nose. Pause at the top of your inhale for a few seconds and then exhale slowly, letting yourself relax. Repeat this three or four times. Practice yoga to help relax and work your muscles. Choose a stress reduction technique that works for you. These techniques take time and practice to develop. Set aside 5-15 minutes a day to do them. Therapists can offer training in these techniques. Do these things to help manage stress: Keep a journal. Know your limits. Set healthy boundaries for yourself and others, such as saying "no" when you think something is too much. Pay attention to how you react to certain situations. You may not be able to control everything, but you can change your reaction. Add humor to your life by watching funny movies or shows. Make time for activities that you enjoy and that relax you. Spend less time using electronics, especially at night before bed. The light from screens can make your brain think it is time to get up rather than go to bed.  Medicines Medicines, such as antidepressants, are often a part of treatment for depression. Talk with your pharmacist or health care provider about all the medicines, supplements, and herbal products that you  take, their possible side effects, and what medicines and other products are safe to take together. Make sure to report any side effects you may have to your health care provider. Relationships Your health care provider may suggest family therapy, couples therapy, or individual therapy as part of your treatment. How to recognize changes Everyone responds differently to treatment for depression. As you recover from depression, you may start to: Have more interest in doing activities. Feel more hopeful. Have more energy. Eat a more regular amount of food. Have better mental focus. It is important to recognize if your depression is not getting better or is getting worse. The symptoms you had in the beginning may return, such as: Feeling tired. Eating too much or too little. Sleeping too much or too little. Feeling restless, agitated, or hopeless. Trouble focusing or making decisions. Having unexplained aches and pains. Feeling irritable, angry, or aggressive. If you or your family members notice these symptoms coming back, let your health care provider know right away. Follow these instructions at home: Activity Try to get some form of exercise each day, such as walking. Try yoga, mindfulness, or other stress reduction techniques. Participate in group activities if you are able. Lifestyle Get enough sleep. Cut down on or stop using caffeine, tobacco, alcohol, and any other harmful substances. Eat a healthy diet that includes plenty of vegetables, fruits, whole grains, low-fat dairy products, and lean protein. Limit foods that are high in solid fats, added sugar, or salt (sodium). General instructions Take over-the-counter and prescription medicines only as told by your health care provider. Keep all follow-up visits. It is important for your health care provider to check on your mood, behavior, and medicines. Your health care provider may need to make changes to your treatment. Where to  find support Talking to others  Friends and family members can be sources of support and guidance. Talk to trusted friends or family members about your condition. Explain your symptoms and let them know that you are working with a health care provider to treat your depression. Tell friends and family how they can help. Finances Find mental health providers  that fit with your financial situation. Talk with your health care provider if you are worried about access to food, housing, or medicine. Call your insurance company to learn about your co-pays and prescription plan. Where to find more information You can find support in your area from: Anxiety and Depression Association of America (ADAA): adaa.org Mental Health [place]: mentalhealthamerica.net [employer] on Mental Illness: nami.org Contact a health care provider if: You stop taking your antidepressant medicines, and you have any of these symptoms: Nausea. Headache. Light-headedness. Chills and body aches. Not being able to sleep (insomnia). You or your friends and family think your depression is getting worse. Get help right away if: You have thoughts of hurting yourself or others. Get help right away if you feel like you may hurt yourself or others, or have thoughts about taking your own life. Go to your nearest emergency room or: Call 911. Call the National Suicide Prevention Lifeline at 763 317 5012 or 988. This is open 24 hours a day. Text the Crisis Text Line at (229) 217-4203. This information is not intended to replace advice given to you by your health care provider. Make sure you discuss any questions you have with your health care provider. Document Revised: 09/25/2021 Document Reviewed: 09/25/2021 Elsevier Patient Education  2024 Elsevier Inc.     Signed,   Meredith Staggers, MD [facility], [facility] 03/14/23 12:08 PM

## 2023-03-14 NOTE — Patient Instructions (Addendum)
As we discussed I do think you have a virus, including possible covid or flu infection, but unable to tell you definitely without a test.  However as we discussed I do not think that is absolutely necessary at this time if we are not going to change any treatment or new medicines based on those results.  I think it is reasonable to continue Tylenol as needed for body aches or fever although you do not have a fever here today.  Mucinex over-the-counter can be helpful for cough or congestion if needed and salt water nasal spray or saline nasal spray can help with nasal congestion with minimal side effects.  Continue to drink plenty of fluids and rest, and I expect you to be improving into next week.  If any persistent discolored nasal discharge, worsening cough, congestion we are happy to see you again to discuss other treatments but again I expect you to be improving soon.  If any chest pains, shortness of breath, especially at rest, confusion or difficulty drinking fluids, or other acute worsening symptoms be seen right away.  I do not expect that to occur.  Please follow-up with your primary provider to discuss the depression symptoms as there are different ways to treat those symptoms, including various medications, and counseling can be helpful.  I have included some information below on managing some depression symptoms without medication, but please follow-up with Don [name] to discuss further.  Thanks for coming in today and take care.   Upper Respiratory Infection, Adult An upper respiratory infection (URI) is a common viral infection of the nose, throat, and upper air passages that lead to the lungs. The most common type of URI is the common cold. URIs usually get better on their own, without medical treatment. What are the causes? A URI is caused by a virus. You may catch a virus by: Breathing in droplets from an infected person's cough or sneeze. Touching something that has been exposed to the  virus (is contaminated) and then touching your mouth, nose, or eyes. What increases the risk? You are more likely to get a URI if: You are very young or very old. You have close contact with others, such as at work, school, or a health care facility. You smoke. You have long-term (chronic) heart or lung disease. You have a weakened disease-fighting system (immune system). You have nasal allergies or asthma. You are experiencing a lot of stress. You have poor nutrition. What are the signs or symptoms? A URI usually involves some of the following symptoms: Runny or stuffy (congested) nose. Cough. Sneezing. Sore throat. Headache. Fatigue. Fever. Loss of appetite. Pain in your forehead, behind your eyes, and over your cheekbones (sinus pain). Muscle aches. Redness or irritation of the eyes. Pressure in the ears or face. How is this diagnosed? This condition may be diagnosed based on your medical history and symptoms, and a physical exam. Your health care provider may use a swab to take a mucus sample from your nose (nasal swab). This sample can be tested to determine what virus is causing the illness. How is this treated? URIs usually get better on their own within 7-10 days. Medicines cannot cure URIs, but your health care provider may recommend certain medicines to help relieve symptoms, such as: Over-the-counter cold medicines. Cough suppressants. Coughing is a type of defense against infection that helps to clear the respiratory system, so take these medicines only as recommended by your health care provider. Fever-reducing medicines. Follow these instructions at  home: Activity Rest as needed. If you have a fever, stay home from work or school until your fever is gone or until your health care provider says your URI cannot spread to other people (is no longer contagious). Your health care provider may have you wear a face mask to prevent your infection from spreading. Relieving  symptoms Gargle with a mixture of salt and water 3-4 times a day or as needed. To make salt water, completely dissolve -1 tsp (3-6 g) of salt in 1 cup (237 mL) of warm water. Use a cool-mist humidifier to add moisture to the air. This can help you breathe more easily. Eating and drinking  Drink enough fluid to keep your urine pale yellow. Eat soups and other clear broths. General instructions  Take over-the-counter and prescription medicines only as told by your health care provider. These include cold medicines, fever reducers, and cough suppressants. Do not use any products that contain nicotine or tobacco. These products include cigarettes, chewing tobacco, and vaping devices, such as e-cigarettes. If you need help quitting, ask your health care provider. Stay away from secondhand smoke. Stay up to date on all immunizations, including the yearly (annual) flu vaccine. Keep all follow-up visits. This is important. How to prevent the spread of infection to others URIs can be contagious. To prevent the infection from spreading: Wash your hands with soap and water for at least 20 seconds. If soap and water are not available, use hand sanitizer. Avoid touching your mouth, face, eyes, or nose. Cough or sneeze into a tissue or your sleeve or elbow instead of into your hand or into the air.  Contact a health care provider if: You are getting worse instead of better. You have a fever or chills. Your mucus is brown or red. You have yellow or brown discharge coming from your nose. You have pain in your face, especially when you bend forward. You have swollen neck glands. You have pain while swallowing. You have white areas in the back of your throat. Get help right away if: You have shortness of breath that gets worse. You have severe or persistent: Headache. Ear pain. Sinus pain. Chest pain. You have chronic lung disease along with any of the following: Making high-pitched whistling  sounds when you breathe, most often when you breathe out (wheezing). Prolonged cough (more than 14 days). Coughing up blood. A change in your usual mucus. You have a stiff neck. You have changes in your: Vision. Hearing. Thinking. Mood. These symptoms may be an emergency. Get help right away. Call 911. Do not wait to see if the symptoms will go away. Do not drive yourself to the hospital. Summary An upper respiratory infection (URI) is a common infection of the nose, throat, and upper air passages that lead to the lungs. A URI is caused by a virus. URIs usually get better on their own within 7-10 days. Medicines cannot cure URIs, but your health care provider may recommend certain medicines to help relieve symptoms. This information is not intended to replace advice given to you by your health care provider. Make sure you discuss any questions you have with your health care provider. Document Revised: 12/20/2020 Document Reviewed: 12/20/2020 Elsevier Patient Education  2024 Elsevier Inc.  Managing Depression, Adult Depression is a mental health condition that affects your thoughts, feelings, and actions. Being diagnosed with depression can bring you relief if you did not know why you have felt or behaved a certain way. It could also leave  you feeling overwhelmed. Finding ways to manage your symptoms can help you feel more positive about your future. How to manage lifestyle changes Being depressed is difficult. Depression can increase the level of everyday stress. Stress can make depression symptoms worse. You may believe your symptoms cannot be managed or will never improve. However, there are many things you can try to help manage your symptoms. There is hope. Managing stress  Stress is your body's reaction to life changes and events, both good and bad. Stress can add to your feelings of depression. Learning to manage your stress can help lessen your feelings of depression. Try some of  the following approaches to reducing your stress (stress reduction techniques): Listen to music that you enjoy and that inspires you. Try using a meditation app or take a meditation class. Develop a practice that helps you connect with your spiritual self. Walk in nature, pray, or go to a place of worship. Practice deep breathing. To do this, inhale slowly through your nose. Pause at the top of your inhale for a few seconds and then exhale slowly, letting yourself relax. Repeat this three or four times. Practice yoga to help relax and work your muscles. Choose a stress reduction technique that works for you. These techniques take time and practice to develop. Set aside 5-15 minutes a day to do them. Therapists can offer training in these techniques. Do these things to help manage stress: Keep a journal. Know your limits. Set healthy boundaries for yourself and others, such as saying "no" when you think something is too much. Pay attention to how you react to certain situations. You may not be able to control everything, but you can change your reaction. Add humor to your life by watching funny movies or shows. Make time for activities that you enjoy and that relax you. Spend less time using electronics, especially at night before bed. The light from screens can make your brain think it is time to get up rather than go to bed.  Medicines Medicines, such as antidepressants, are often a part of treatment for depression. Talk with your pharmacist or health care provider about all the medicines, supplements, and herbal products that you take, their possible side effects, and what medicines and other products are safe to take together. Make sure to report any side effects you may have to your health care provider. Relationships Your health care provider may suggest family therapy, couples therapy, or individual therapy as part of your treatment. How to recognize changes Everyone responds differently  to treatment for depression. As you recover from depression, you may start to: Have more interest in doing activities. Feel more hopeful. Have more energy. Eat a more regular amount of food. Have better mental focus. It is important to recognize if your depression is not getting better or is getting worse. The symptoms you had in the beginning may return, such as: Feeling tired. Eating too much or too little. Sleeping too much or too little. Feeling restless, agitated, or hopeless. Trouble focusing or making decisions. Having unexplained aches and pains. Feeling irritable, angry, or aggressive. If you or your family members notice these symptoms coming back, let your health care provider know right away. Follow these instructions at home: Activity Try to get some form of exercise each day, such as walking. Try yoga, mindfulness, or other stress reduction techniques. Participate in group activities if you are able. Lifestyle Get enough sleep. Cut down on or stop using caffeine, tobacco, alcohol, and any  other harmful substances. Eat a healthy diet that includes plenty of vegetables, fruits, whole grains, low-fat dairy products, and lean protein. Limit foods that are high in solid fats, added sugar, or salt (sodium). General instructions Take over-the-counter and prescription medicines only as told by your health care provider. Keep all follow-up visits. It is important for your health care provider to check on your mood, behavior, and medicines. Your health care provider may need to make changes to your treatment. Where to find support Talking to others  Friends and family members can be sources of support and guidance. Talk to trusted friends or family members about your condition. Explain your symptoms and let them know that you are working with a health care provider to treat your depression. Tell friends and family how they can help. Finances Find mental health providers that fit  with your financial situation. Talk with your health care provider if you are worried about access to food, housing, or medicine. Call your insurance company to learn about your co-pays and prescription plan. Where to find more information You can find support in your area from: Anxiety and Depression Association of America (ADAA): adaa.org Mental Health America: mentalhealthamerica.net The [employer] on Mental Illness: nami.org Contact a health care provider if: You stop taking your antidepressant medicines, and you have any of these symptoms: Nausea. Headache. Light-headedness. Chills and body aches. Not being able to sleep (insomnia). You or your friends and family think your depression is getting worse. Get help right away if: You have thoughts of hurting yourself or others. Get help right away if you feel like you may hurt yourself or others, or have thoughts about taking your own life. Go to your nearest emergency room or: Call 911. Call the National Suicide Prevention Lifeline at 437-660-0210 or 988. This is open 24 hours a day. Text the Crisis Text Line at 217-070-3705. This information is not intended to replace advice given to you by your health care provider. Make sure you discuss any questions you have with your health care provider. Document Revised: 09/25/2021 Document Reviewed: 09/25/2021 Elsevier Patient Education  2024 [employer].

## 2023-03-31 ENCOUNTER — Other Ambulatory Visit: Payer: Self-pay

## 2023-03-31 DIAGNOSIS — I10 Essential (primary) hypertension: Secondary | ICD-10-CM

## 2023-03-31 MED ORDER — LOSARTAN POTASSIUM-HCTZ 100-12.5 MG PO TABS
1.0000 | ORAL_TABLET | Freq: Every day | ORAL | 0 refills | Status: DC
Start: 2023-03-31 — End: 2023-04-30

## 2023-04-10 IMAGING — DX DG CHEST 2V
2 series · 2 of 2 positions shown · non-contrast
Comparison: 07/19/2011

CLINICAL DATA: Cough

EXAM:
CHEST - 2 VIEW

[chest pa]
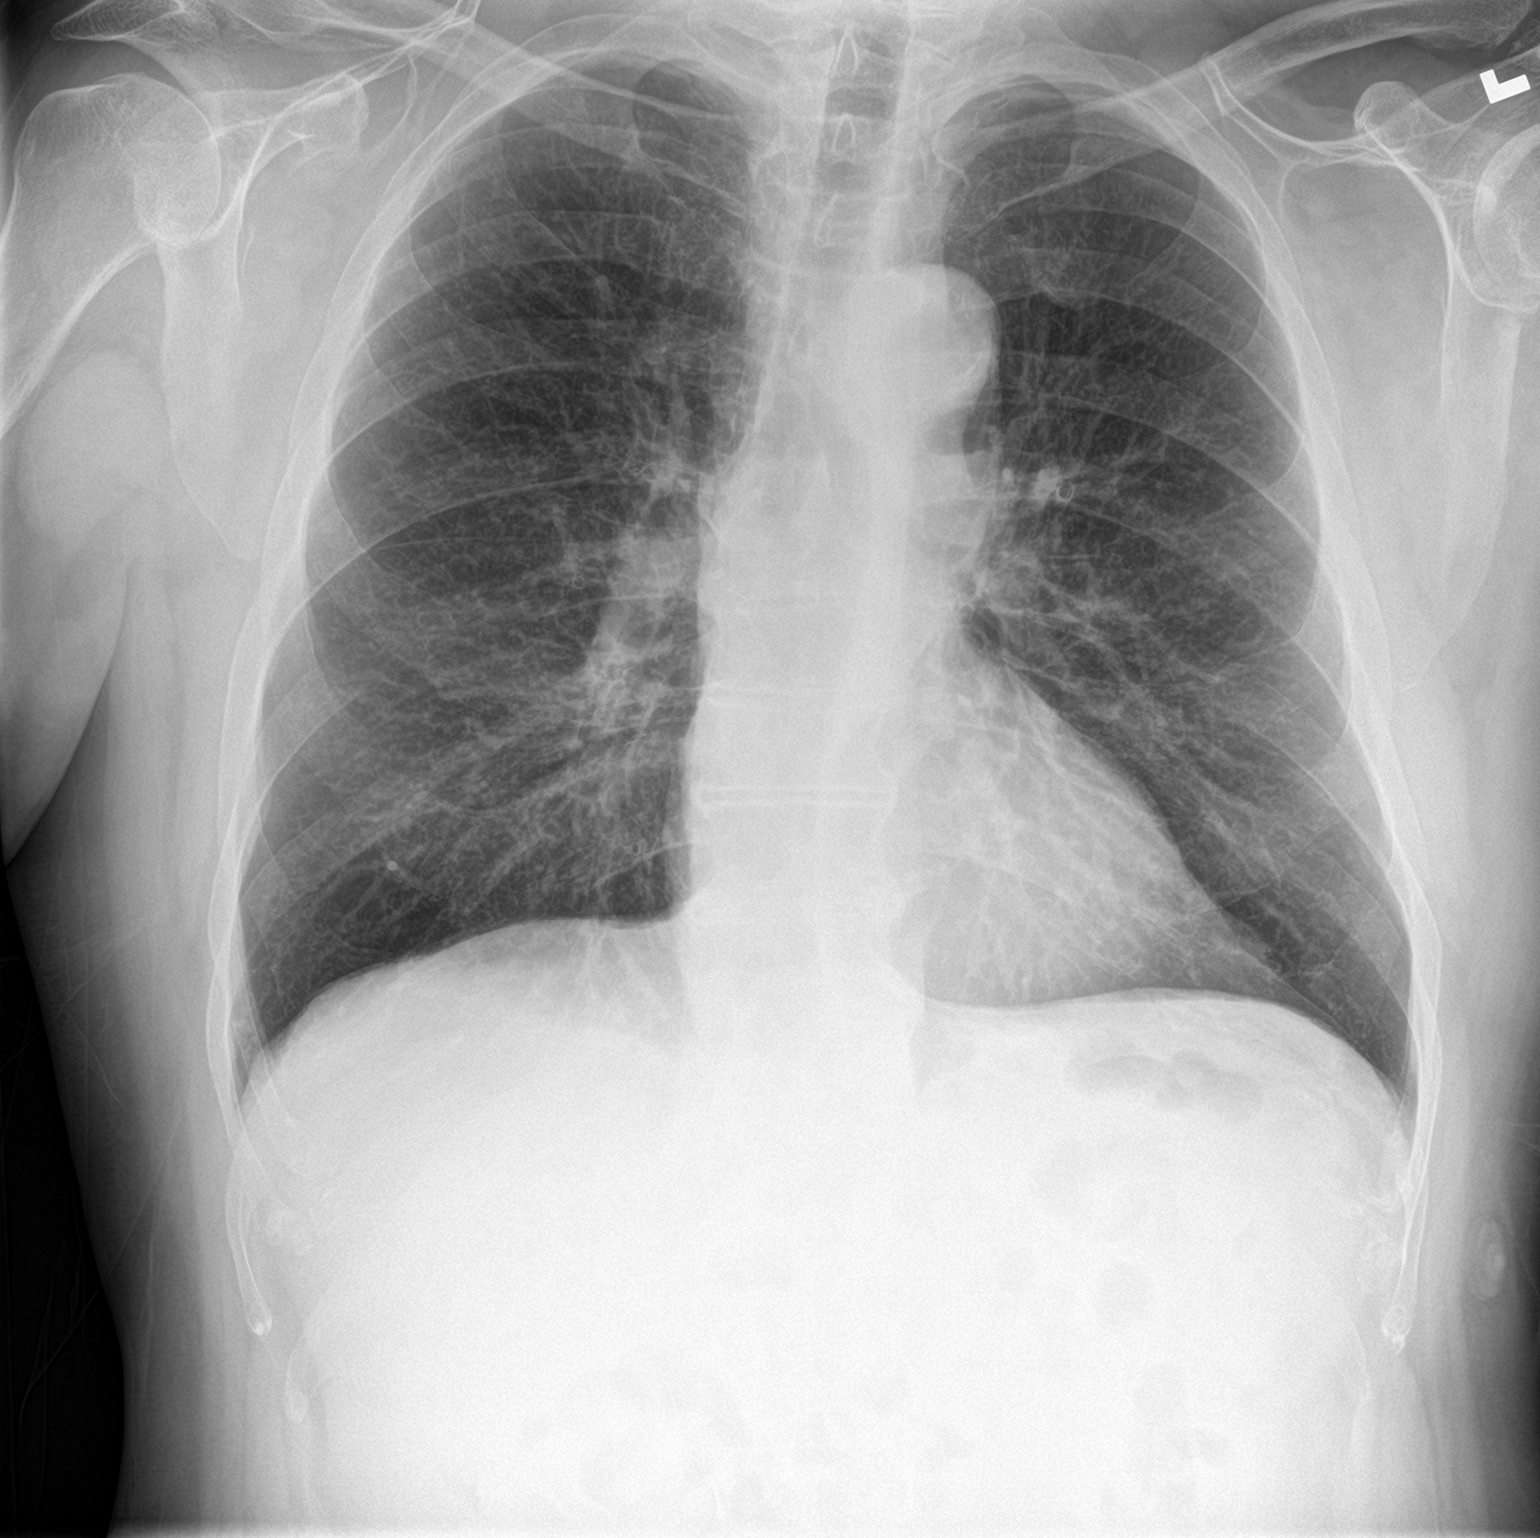

[chest lat]
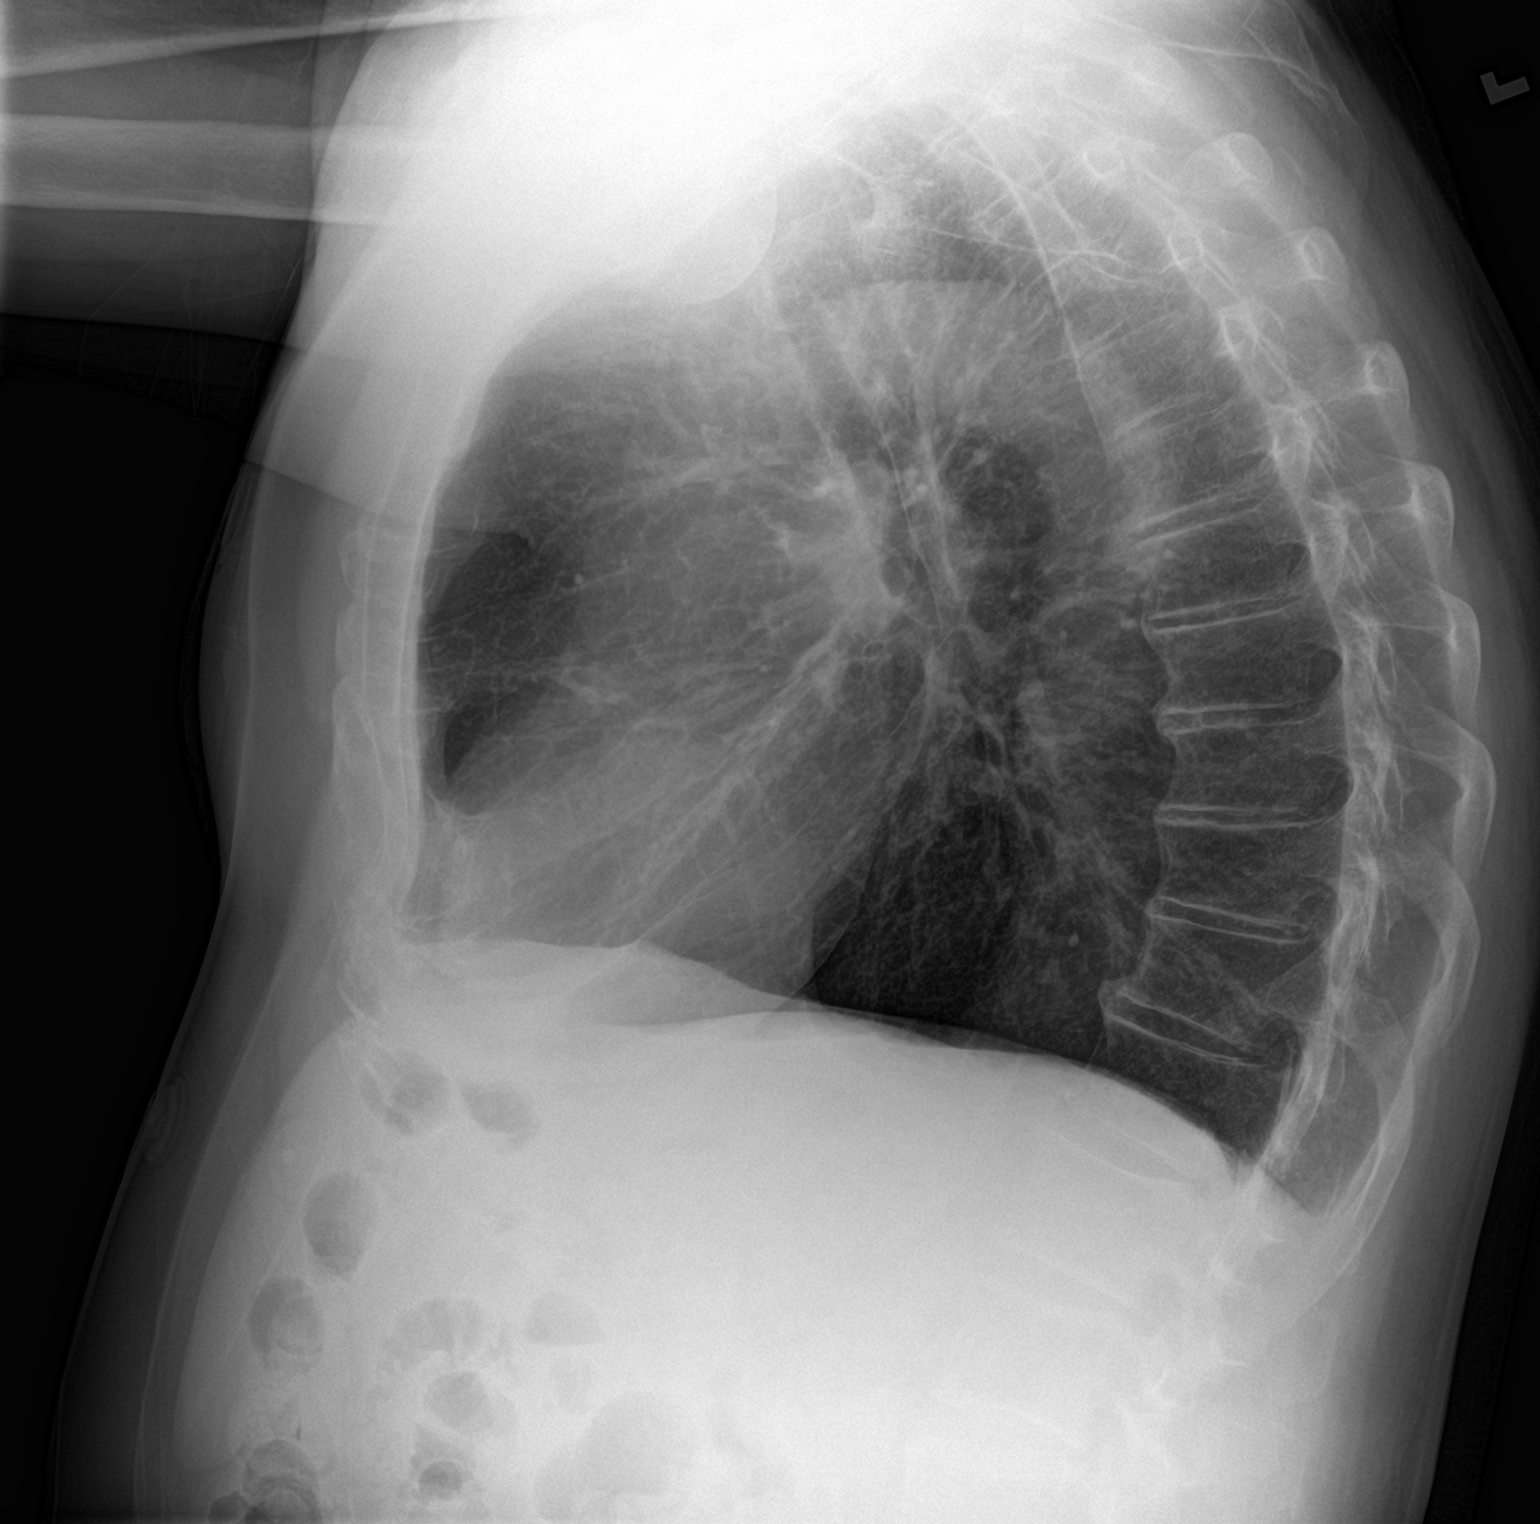

[2 of 2 positions shown; findings below may reference images not displayed]

FINDINGS: The heart size and mediastinal contours are within normal limits.
Both lungs are clear. The visualized skeletal structures are
unremarkable.
IMPRESSION: No active cardiopulmonary disease.

## 2023-04-16 ENCOUNTER — Ambulatory Visit: Payer: Medicare PPO

## 2023-04-16 ENCOUNTER — Ambulatory Visit (INDEPENDENT_AMBULATORY_CARE_PROVIDER_SITE_OTHER): Payer: Medicare PPO | Admitting: Adult Health

## 2023-04-16 ENCOUNTER — Ambulatory Visit: Payer: Medicare PPO | Admitting: Adult Health

## 2023-04-16 ENCOUNTER — Encounter: Payer: Self-pay | Admitting: Adult Health

## 2023-04-16 VITALS — BP 150/100 | HR 74 | Temp 97.7°F | Ht 70.0 in | Wt 184.0 lb

## 2023-04-16 DIAGNOSIS — R52 Pain, unspecified: Secondary | ICD-10-CM | POA: Diagnosis not present

## 2023-04-16 DIAGNOSIS — I700 Atherosclerosis of aorta: Secondary | ICD-10-CM | POA: Diagnosis not present

## 2023-04-16 DIAGNOSIS — R059 Cough, unspecified: Secondary | ICD-10-CM

## 2023-04-16 DIAGNOSIS — R0602 Shortness of breath: Secondary | ICD-10-CM | POA: Diagnosis not present

## 2023-04-16 DIAGNOSIS — I10 Essential (primary) hypertension: Secondary | ICD-10-CM | POA: Diagnosis not present

## 2023-04-16 DIAGNOSIS — R918 Other nonspecific abnormal finding of lung field: Secondary | ICD-10-CM | POA: Diagnosis not present

## 2023-04-16 LAB — POC COVID19 BINAXNOW: SARS Coronavirus 2 Ag: NEGATIVE

## 2023-04-16 LAB — POCT INFLUENZA A/B
Influenza A, POC: NEGATIVE
Influenza B, POC: NEGATIVE

## 2023-04-16 MED ORDER — DOXYCYCLINE HYCLATE 100 MG PO CAPS
100.0000 mg | ORAL_CAPSULE | Freq: Two times a day (BID) | ORAL | 0 refills | Status: DC
Start: 2023-04-16 — End: 2023-04-21

## 2023-04-16 MED ORDER — PREDNISONE 10 MG PO TABS
ORAL_TABLET | ORAL | 0 refills | Status: DC
Start: 2023-04-16 — End: 2023-04-17

## 2023-04-16 NOTE — Patient Instructions (Signed)
I am going to do a chest xray today   I have also sent in an antibiotic called Doxycycline and some steroids called prednisone   We will follow up with xray results in a few days

## 2023-04-16 NOTE — Progress Notes (Signed)
Subjective:    Patient ID: Don [name], male    DOB: [birth year], 67 y.o.   MRN: [id number]  HPI  67 year old male who  has a past medical history of Arthritis, Cancer (HCC), Chronic pain, COPD (chronic obstructive pulmonary disease) (HCC), Depression, Hypertension, Renal infarct (HCC) (06/10/2022), and Skin cancer.  He presents to the office today for an acute issue. His symptoms started less than a week ago. Symptoms seem to be getting worse. His symptoms include that of a productive cough with yellow sputum, wheezing, shortness of breath, body aches, rhinorrhea with discolored mucus.   He does smoke   Denies fevers/chllis, sinus pain or pressure.   At home he has been using Theraflu and Nyquil   Review of Systems See HPI   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   Skin cancer     Social History   Socioeconomic History   Marital status: Significant Other    Spouse name: Not on file   Number of children: 2   Years of education: Not on file   Highest education level: Some college, no degree  Occupational History   Occupation: Retired  Tobacco Use   Smoking status: Every Day    Current packs/day: 0.50    Average packs/day: 0.5 packs/day for 30.0 years (15.0 ttl pk-yrs)    Types: Cigarettes    Passive exposure: Current   Smokeless tobacco: Never  Vaping Use   Vaping status: Never Used  Substance and Sexual Activity   Alcohol use: No   Drug use: No   Sexual activity: Yes  Other Topics Concern   Not on file  Social History Narrative   Patient lives girlfriend and another gentleman. No pets.    Social  Determinants of Health   Financial Resource Strain: Low Risk  (09/26/2022)   Overall Financial Resource Strain (CARDIA)    Difficulty of Paying Living Expenses: Not hard at all  Food Insecurity: Low Risk  (10/11/2022)   [facility]   Hunger Vital Sign    Worried About Running Out of Food in the Last Year: Never true    Ran Out of Food in the Last Year: Never true  Transportation Needs: No Transportation Needs (10/11/2022)   [facility]   Transportation    In the past 12 months, has lack of reliable transportation kept you from medical appointments, meetings, work or from getting things needed for daily living? : No  Physical Activity: Inactive (09/26/2022)   Exercise Vital Sign    Days of Exercise per Week: 0 days    Minutes of Exercise per Session: 0 min  Stress: No Stress Concern Present (09/26/2022)   [employer] of Occupational Health - Occupational Stress Questionnaire    Feeling of Stress : Not at all  Social Connections: Moderately Integrated (09/26/2022)   Social Connection and Isolation Panel [NHANES]    Frequency of Communication with Friends and Family: Twice a week    Frequency of Social Gatherings with Friends and Family: Twice a  week    Attends Religious Services: More than 4 times per year    Active Member of Clubs or Organizations: No    Attends Banker Meetings: Never    Marital Status: Living with partner  Intimate Partner Violence: Not At Risk (09/26/2022)   Humiliation, Afraid, Rape, and Kick questionnaire    Fear of Current or Ex-Partner: No    Emotionally Abused: No    Physically Abused: No    Sexually Abused: No    Past Surgical History:  Procedure Laterality Date   cartilage removed from both knees     EXCISION MASS HEAD Right 08/28/2022   Procedure: EXCISION OF EAR WOUND;  Surgeon: Scarlette Ar, MD;  Location: [facility] OR;  Service: ENT;  Laterality: Right;   EXCISION MASS HEAD N/A  10/03/2022   Procedure: EXCISION WOUNDHEAD LESS THAN 100 CM^2 FOR FLAP;  Surgeon: Scarlette Ar, MD;  Location: [facility] OR;  Service: ENT;  Laterality: N/A;   KNEE ARTHROSCOPY     left wrist     SKIN CANCER EXCISION     SKIN FULL THICKNESS GRAFT Right 08/28/2022   Procedure: RIGHT EXCISION OF EAR WOUND, ADJACENT TISSUE TRANSFER, RIGHT SKIN GRAFT FROM NECK TO EAR;  Surgeon: Scarlette Ar, MD;  Location: [facility] OR;  Service: ENT;  Laterality: Right;   SKIN FULL THICKNESS GRAFT N/A 10/03/2022   Procedure: ADJACENT TISSUE TRANSFER LEFT CHEEK AND RIGHT EAR, FULL THICKNESS SKIN GRAFT LEFT NECK TO LEFT LOWER EYELID AND RIGHT EAR;  Surgeon: Scarlette Ar, MD;  Location: [facility] OR;  Service: ENT;  Laterality: N/A;    Family History  Problem Relation Age of Onset   Cancer Father        Skin   Diabetes Father    Hypertension Father     No Known Allergies  Current Outpatient Medications on File Prior to Visit  Medication Sig Dispense Refill   acetaminophen (TYLENOL) 500 MG tablet Take 1 tablet (500 mg total) by mouth every 6 (six) hours as needed. 30 tablet 0   albuterol (VENTOLIN HFA) 108 (90 Base) MCG/ACT inhaler Inhale 1-2 puffs into the lungs every 4 (four) hours as needed for wheezing or shortness of breath. 1 each 2   aspirin 81 MG chewable tablet Chew 1 tablet (81 mg total) by mouth daily.     benzonatate (TESSALON) 100 MG capsule Take 1 capsule (100 mg total) by mouth 2 (two) times daily as needed for cough. 20 capsule 0   diltiazem (CARDIZEM CD) 120 MG 24 hr capsule Take by mouth.     diphenhydrAMINE (BENADRYL) 25 MG tablet Take 25 mg by mouth daily as needed for allergies or itching.     ibuprofen (ADVIL) 200 MG tablet Take 800 mg by mouth every 6 (six) hours as needed for moderate pain.     losartan-hydrochlorothiazide (HYZAAR) 100-12.5 MG tablet Take 1 tablet by mouth daily. 30 tablet 0   Multiple Vitamins-Minerals (MULTIVITAMIN WITH MINERALS) tablet Take 1 tablet by mouth daily.     tamsulosin  (FLOMAX) 0.4 MG CAPS capsule Take 1 capsule (0.4 mg total) by mouth daily. 30 capsule 3   No current facility-administered medications on file prior to visit.    BP (!) 150/100   Pulse 74   Temp 97.7 F (36.5 C) (Oral)   Ht 5\' 10"  (1.778 m)   Wt 184 lb (83.5 kg)   SpO2 98%   BMI 26.40 kg/m       Objective:   Physical Exam Vitals  and nursing note reviewed.  Constitutional:      Appearance: Normal appearance.  Cardiovascular:     Rate and Rhythm: Normal rate and regular rhythm.     Pulses: Normal pulses.     Heart sounds: Normal heart sounds.  Pulmonary:     Effort: Pulmonary effort is normal.     Breath sounds: Examination of the right-upper field reveals wheezing and rhonchi. Examination of the left-upper field reveals wheezing and rhonchi. Examination of the right-middle field reveals wheezing and rhonchi. Examination of the left-middle field reveals wheezing and rhonchi. Examination of the right-lower field reveals wheezing and rhonchi. Examination of the left-lower field reveals wheezing and rhonchi. Wheezing and rhonchi present. No rales.  Neurological:     General: No focal deficit present.     Mental Status: He is alert and oriented to person, place, and time.  Psychiatric:        Mood and Affect: Mood normal.        Behavior: Behavior normal.        Thought Content: Thought content normal.        Judgment: Judgment normal.        Assessment & Plan:  1. Cough, unspecified type - There is concern for pneumonia. Will get chest xray today and start on Doxycycline and prednisone  - Follow up if not improving in the next 3-4 days or sooner if needed - Encouraged to quit smoking  - POC Influenza A/B- negative - POC COVID-19- negative - doxycycline (VIBRAMYCIN) 100 MG capsule; Take 1 capsule (100 mg total) by mouth 2 (two) times daily.  Dispense: 14 capsule; Refill: 0 - predniSONE (DELTASONE) 10 MG tablet; 40 mg x 3 days, 20 mg x 3 days, 10 mg x 3 days  Dispense: 21  tablet; Refill: 0 - DG Chest 2 View; Future  2. Body aches  - POC Influenza A/B - POC COVID-19  3. Primary hypertension - possibly from OTC medications - Monitor at home and follow up with PCP if not at goal   Shirline Frees, NP

## 2023-04-17 ENCOUNTER — Telehealth: Payer: Self-pay | Admitting: Family Medicine

## 2023-04-17 DIAGNOSIS — R059 Cough, unspecified: Secondary | ICD-10-CM

## 2023-04-17 MED ORDER — PREDNISONE 10 MG PO TABS
ORAL_TABLET | ORAL | 0 refills | Status: AC
Start: 2023-04-17 — End: ?

## 2023-04-17 NOTE — Telephone Encounter (Signed)
Patient notified of update  and verbalized understanding. 

## 2023-04-17 NOTE — Telephone Encounter (Signed)
Please advise 

## 2023-04-17 NOTE — Telephone Encounter (Signed)
Pt did pick up predniSONE (DELTASONE) 10 MG tablet  yesterday and lost medication and would like another rx send to  [facility] [address], [place] - [address] Phone: [phone]  Fax: [phone]

## 2023-04-17 NOTE — Telephone Encounter (Signed)
Sent to wrong provider. Please advise.

## 2023-04-18 ENCOUNTER — Other Ambulatory Visit (HOSPITAL_BASED_OUTPATIENT_CLINIC_OR_DEPARTMENT_OTHER): Payer: Self-pay

## 2023-04-18 ENCOUNTER — Encounter (HOSPITAL_BASED_OUTPATIENT_CLINIC_OR_DEPARTMENT_OTHER): Payer: Self-pay | Admitting: Emergency Medicine

## 2023-04-18 ENCOUNTER — Emergency Department (HOSPITAL_BASED_OUTPATIENT_CLINIC_OR_DEPARTMENT_OTHER)
Admission: EM | Admit: 2023-04-18 | Discharge: 2023-04-18 | Disposition: A | Discharge status: Home / Self Care | Payer: Medicare PPO | Attending: Emergency Medicine | Admitting: Emergency Medicine

## 2023-04-18 ENCOUNTER — Emergency Department (HOSPITAL_BASED_OUTPATIENT_CLINIC_OR_DEPARTMENT_OTHER): Payer: Medicare PPO

## 2023-04-18 ENCOUNTER — Encounter (HOSPITAL_BASED_OUTPATIENT_CLINIC_OR_DEPARTMENT_OTHER): Payer: Self-pay

## 2023-04-18 ENCOUNTER — Other Ambulatory Visit: Payer: Self-pay

## 2023-04-18 ENCOUNTER — Emergency Department (HOSPITAL_BASED_OUTPATIENT_CLINIC_OR_DEPARTMENT_OTHER)
Admission: EM | Admit: 2023-04-18 | Discharge: 2023-04-18 | Discharge status: Against Medical Advice | Payer: Medicare PPO | Source: Home / Self Care

## 2023-04-18 DIAGNOSIS — Z7982 Long term (current) use of aspirin: Secondary | ICD-10-CM | POA: Diagnosis not present

## 2023-04-18 DIAGNOSIS — R059 Cough, unspecified: Secondary | ICD-10-CM | POA: Diagnosis not present

## 2023-04-18 DIAGNOSIS — R1111 Vomiting without nausea: Secondary | ICD-10-CM | POA: Diagnosis not present

## 2023-04-18 DIAGNOSIS — I10 Essential (primary) hypertension: Secondary | ICD-10-CM | POA: Diagnosis present

## 2023-04-18 DIAGNOSIS — R0602 Shortness of breath: Secondary | ICD-10-CM | POA: Insufficient documentation

## 2023-04-18 DIAGNOSIS — M1990 Unspecified osteoarthritis, unspecified site: Secondary | ICD-10-CM | POA: Diagnosis present

## 2023-04-18 DIAGNOSIS — Z8249 Family history of ischemic heart disease and other diseases of the circulatory system: Secondary | ICD-10-CM | POA: Diagnosis not present

## 2023-04-18 DIAGNOSIS — J449 Chronic obstructive pulmonary disease, unspecified: Secondary | ICD-10-CM | POA: Insufficient documentation

## 2023-04-18 DIAGNOSIS — Z79899 Other long term (current) drug therapy: Secondary | ICD-10-CM | POA: Diagnosis not present

## 2023-04-18 DIAGNOSIS — T502X5A Adverse effect of carbonic-anhydrase inhibitors, benzothiadiazides and other diuretics, initial encounter: Secondary | ICD-10-CM | POA: Diagnosis present

## 2023-04-18 DIAGNOSIS — R112 Nausea with vomiting, unspecified: Secondary | ICD-10-CM | POA: Diagnosis not present

## 2023-04-18 DIAGNOSIS — F17210 Nicotine dependence, cigarettes, uncomplicated: Secondary | ICD-10-CM | POA: Diagnosis present

## 2023-04-18 DIAGNOSIS — R1110 Vomiting, unspecified: Secondary | ICD-10-CM | POA: Insufficient documentation

## 2023-04-18 DIAGNOSIS — D72829 Elevated white blood cell count, unspecified: Secondary | ICD-10-CM | POA: Diagnosis present

## 2023-04-18 DIAGNOSIS — R918 Other nonspecific abnormal finding of lung field: Secondary | ICD-10-CM | POA: Diagnosis not present

## 2023-04-18 DIAGNOSIS — F32A Depression, unspecified: Secondary | ICD-10-CM | POA: Diagnosis present

## 2023-04-18 DIAGNOSIS — J40 Bronchitis, not specified as acute or chronic: Secondary | ICD-10-CM | POA: Diagnosis not present

## 2023-04-18 DIAGNOSIS — Z5321 Procedure and treatment not carried out due to patient leaving prior to being seen by health care provider: Secondary | ICD-10-CM | POA: Insufficient documentation

## 2023-04-18 DIAGNOSIS — I700 Atherosclerosis of aorta: Secondary | ICD-10-CM | POA: Diagnosis not present

## 2023-04-18 DIAGNOSIS — B9789 Other viral agents as the cause of diseases classified elsewhere: Secondary | ICD-10-CM | POA: Diagnosis present

## 2023-04-18 DIAGNOSIS — E871 Hypo-osmolality and hyponatremia: Secondary | ICD-10-CM | POA: Diagnosis present

## 2023-04-18 DIAGNOSIS — D696 Thrombocytopenia, unspecified: Secondary | ICD-10-CM | POA: Diagnosis present

## 2023-04-18 DIAGNOSIS — Z1152 Encounter for screening for COVID-19: Secondary | ICD-10-CM | POA: Diagnosis not present

## 2023-04-18 DIAGNOSIS — E876 Hypokalemia: Secondary | ICD-10-CM | POA: Diagnosis present

## 2023-04-18 DIAGNOSIS — Z833 Family history of diabetes mellitus: Secondary | ICD-10-CM | POA: Diagnosis not present

## 2023-04-18 DIAGNOSIS — R110 Nausea: Secondary | ICD-10-CM | POA: Diagnosis not present

## 2023-04-18 DIAGNOSIS — J441 Chronic obstructive pulmonary disease with (acute) exacerbation: Secondary | ICD-10-CM | POA: Diagnosis not present

## 2023-04-18 DIAGNOSIS — Z85828 Personal history of other malignant neoplasm of skin: Secondary | ICD-10-CM | POA: Diagnosis not present

## 2023-04-18 DIAGNOSIS — G8929 Other chronic pain: Secondary | ICD-10-CM | POA: Diagnosis present

## 2023-04-18 LAB — CBC WITH DIFFERENTIAL/PLATELET
Abs Immature Granulocytes: 0.07 10*3/uL (ref 0.00–0.07)
Basophils Absolute: 0 10*3/uL (ref 0.0–0.1)
Basophils Relative: 0 %
Eosinophils Absolute: 0 10*3/uL (ref 0.0–0.5)
Eosinophils Relative: 0 %
HCT: 42.1 % (ref 39.0–52.0)
Hemoglobin: 14.7 g/dL (ref 13.0–17.0)
Immature Granulocytes: 0 %
Lymphocytes Relative: 14 %
Lymphs Abs: 2.1 10*3/uL (ref 0.7–4.0)
MCH: 31.9 pg (ref 26.0–34.0)
MCHC: 34.9 g/dL (ref 30.0–36.0)
MCV: 91.3 fL (ref 80.0–100.0)
Monocytes Absolute: 1.1 10*3/uL — ABNORMAL HIGH (ref 0.1–1.0)
Monocytes Relative: 7 %
Neutro Abs: 12.5 10*3/uL — ABNORMAL HIGH (ref 1.7–7.7)
Neutrophils Relative %: 79 %
Platelets: 245 10*3/uL (ref 150–400)
RBC: 4.61 MIL/uL (ref 4.22–5.81)
RDW: 12.7 % (ref 11.5–15.5)
WBC: 15.9 10*3/uL — ABNORMAL HIGH (ref 4.0–10.5)
nRBC: 0 % (ref 0.0–0.2)

## 2023-04-18 LAB — BASIC METABOLIC PANEL
Anion gap: 11 (ref 5–15)
BUN: 12 mg/dL (ref 8–23)
CO2: 26 mmol/L (ref 22–32)
Calcium: 10.6 mg/dL — ABNORMAL HIGH (ref 8.9–10.3)
Chloride: 101 mmol/L (ref 98–111)
Creatinine, Ser: 0.78 mg/dL (ref 0.61–1.24)
GFR, Estimated: >60 mL/min (ref >60)
Glucose, Bld: 146 mg/dL — ABNORMAL HIGH (ref 70–99)
Potassium: 3.3 mmol/L — ABNORMAL LOW (ref 3.5–5.1)
Sodium: 138 mmol/L (ref 135–145)

## 2023-04-18 LAB — BRAIN NATRIURETIC PEPTIDE: B Natriuretic Peptide: 35.9 pg/mL (ref 0.0–100.0)

## 2023-04-18 LAB — TROPONIN I (HIGH SENSITIVITY)
Troponin I (High Sensitivity): 8 ng/L (ref <18)
Troponin I (High Sensitivity): 9 ng/L (ref <18)

## 2023-04-18 MED ORDER — ALBUTEROL SULFATE (2.5 MG/3ML) 0.083% IN NEBU
5.0000 mg | INHALATION_SOLUTION | Freq: Once | RESPIRATORY_TRACT | Status: AC
  Administered 2023-04-18: 5 mg via RESPIRATORY_TRACT
  Filled 2023-04-18: qty 6

## 2023-04-18 MED ORDER — MORPHINE SULFATE (PF) 4 MG/ML IV SOLN
4.0000 mg | Freq: Once | INTRAVENOUS | Status: AC
  Administered 2023-04-18: 4 mg via INTRAVENOUS
  Filled 2023-04-18: qty 1

## 2023-04-18 MED ORDER — HYDROCODONE BIT-HOMATROP MBR 5-1.5 MG/5ML PO SOLN: 5.0000 mL | Freq: Four times a day (QID) | ORAL | 0 refills | Status: AC | PRN

## 2023-04-18 MED ORDER — ALBUTEROL SULFATE HFA 108 (90 BASE) MCG/ACT IN AERS
2.0000 | INHALATION_SPRAY | RESPIRATORY_TRACT | Status: DC | PRN
  Administered 2023-04-18: 2 via RESPIRATORY_TRACT
  Filled 2023-04-18: qty 6.7

## 2023-04-18 MED ORDER — ALBUTEROL SULFATE (2.5 MG/3ML) 0.083% IN NEBU
2.5000 mg | INHALATION_SOLUTION | Freq: Once | RESPIRATORY_TRACT | Status: AC
  Administered 2023-04-18: 2.5 mg via RESPIRATORY_TRACT
  Filled 2023-04-18: qty 3

## 2023-04-18 MED ORDER — ACETAMINOPHEN 325 MG PO TABS
650.0000 mg | ORAL_TABLET | Freq: Once | ORAL | Status: AC
  Administered 2023-04-18: 650 mg via ORAL
  Filled 2023-04-18: qty 2

## 2023-04-18 MED ORDER — FAMOTIDINE IN NACL 20-0.9 MG/50ML-% IV SOLN
20.0000 mg | Freq: Once | INTRAVENOUS | Status: AC
  Administered 2023-04-18: 20 mg via INTRAVENOUS
  Filled 2023-04-18: qty 50

## 2023-04-18 MED ORDER — LACTATED RINGERS IV BOLUS
1000.0000 mL | Freq: Once | INTRAVENOUS | Status: AC
  Administered 2023-04-18: 1000 mL via INTRAVENOUS

## 2023-04-18 MED ORDER — HYDRALAZINE HCL 20 MG/ML IJ SOLN
5.0000 mg | Freq: Once | INTRAMUSCULAR | Status: AC
  Administered 2023-04-18: 5 mg via INTRAVENOUS
  Filled 2023-04-18: qty 1

## 2023-04-18 MED ORDER — IPRATROPIUM-ALBUTEROL 0.5-2.5 (3) MG/3ML IN SOLN
3.0000 mL | Freq: Once | RESPIRATORY_TRACT | Status: AC
  Administered 2023-04-18: 3 mL via RESPIRATORY_TRACT
  Filled 2023-04-18: qty 3

## 2023-04-18 MED ORDER — IPRATROPIUM BROMIDE 0.02 % IN SOLN
0.5000 mg | Freq: Once | RESPIRATORY_TRACT | Status: AC
  Administered 2023-04-18: 0.5 mg via RESPIRATORY_TRACT
  Filled 2023-04-18: qty 2.5

## 2023-04-18 MED ORDER — ONDANSETRON HCL 4 MG/2ML IJ SOLN
4.0000 mg | Freq: Once | INTRAMUSCULAR | Status: AC
  Administered 2023-04-18: 4 mg via INTRAVENOUS
  Filled 2023-04-18: qty 2

## 2023-04-18 NOTE — ED Provider Notes (Signed)
[place] EMERGENCY DEPARTMENT AT [facility] Provider Note   CSN: [id number] Arrival date & time: 04/18/23  0555     History  Chief Complaint  Patient presents with   Shortness of Breath    TILMON BOSARGE is a 67 y.o. male.  Patient is a 67 year old male with past medical history of hypertension, COPD.  Patient presenting today for evaluation of shortness of breath.  He reports a 4-day history of shortness of breath, cough intermittently productive of yellow mucus, and feeling generally unwell.  This morning noticed that his blood pressure was markedly elevated and presents for evaluation.  He denies fevers or chills.  He denies ill contacts.  He was seen by his primary doctor yesterday and was prescribed doxycycline and prednisone.  He has had 2 doses of each, but symptoms are worsening.  The history is provided by the patient.       Home Medications Prior to Admission medications   Medication Sig Start Date End Date Taking? Authorizing Provider  acetaminophen (TYLENOL) 500 MG tablet Take 1 tablet (500 mg total) by mouth every 6 (six) hours as needed. 08/28/22   Scarlette Ar, MD  albuterol (VENTOLIN HFA) 108 (90 Base) MCG/ACT inhaler Inhale 1-2 puffs into the lungs every 4 (four) hours as needed for wheezing or shortness of breath. 06/11/22   Standley Brooking, MD  aspirin 81 MG chewable tablet Chew 1 tablet (81 mg total) by mouth daily. 06/12/22   Standley Brooking, MD  benzonatate (TESSALON) 100 MG capsule Take 1 capsule (100 mg total) by mouth 2 (two) times daily as needed for cough. 08/09/22   Alveria Apley, NP  diltiazem (CARDIZEM CD) 120 MG 24 hr capsule Take by mouth. 03/15/11   [provider]  diphenhydrAMINE (BENADRYL) 25 MG tablet Take 25 mg by mouth daily as needed for allergies or itching.    [provider]  doxycycline (VIBRAMYCIN) 100 MG capsule Take 1 capsule (100 mg total) by mouth 2 (two) times daily. 04/16/23   Nafziger,  Kandee Keen, NP  ibuprofen (ADVIL) 200 MG tablet Take 800 mg by mouth every 6 (six) hours as needed for moderate pain.    [provider]  losartan-hydrochlorothiazide (HYZAAR) 100-12.5 MG tablet Take 1 tablet by mouth daily. 03/31/23   Alveria Apley, NP  Multiple Vitamins-Minerals (MULTIVITAMIN WITH MINERALS) tablet Take 1 tablet by mouth daily.    [provider]  predniSONE (DELTASONE) 10 MG tablet 40 mg x 3 days, 20 mg x 3 days, 10 mg x 3 days 04/17/23   Shirline Frees, NP  tamsulosin (FLOMAX) 0.4 MG CAPS capsule Take 1 capsule (0.4 mg total) by mouth daily. 02/07/23   Alveria Apley, NP      Allergies    Patient has no known allergies.    Review of Systems   Review of Systems  All other systems reviewed and are negative.   Physical Exam Updated Vital Signs BP (!) 204/126 (BP Location: Right Arm)   Pulse (!) 103   Temp 98 F (36.7 C) (Oral)   Resp (!) 22   Ht 5\' 11"  (1.803 m)   Wt 83.5 kg   SpO2 99%   BMI 25.66 kg/m  Physical Exam Vitals and nursing note reviewed.  Constitutional:      General: He is not in acute distress.    Appearance: He is well-developed. He is not diaphoretic.  HENT:     Head: Normocephalic and atraumatic.  Cardiovascular:  Rate and Rhythm: Normal rate and regular rhythm.     Heart sounds: No murmur heard.    No friction rub.  Pulmonary:     Effort: Pulmonary effort is normal. No respiratory distress.     Breath sounds: Rhonchi present. No wheezing or rales.  Abdominal:     General: Bowel sounds are normal. There is no distension.     Palpations: Abdomen is soft.     Tenderness: There is no abdominal tenderness.  Musculoskeletal:        General: Normal range of motion.     Cervical back: Normal range of motion and neck supple.  Skin:    General: Skin is warm and dry.  Neurological:     Mental Status: He is alert and oriented to person, place, and time.     Coordination: Coordination normal.     ED Results /  Procedures / Treatments   Labs (all labs ordered are listed, but only abnormal results are displayed) Labs Reviewed  BRAIN NATRIURETIC PEPTIDE  CBC WITH DIFFERENTIAL/PLATELET  BASIC METABOLIC PANEL  TROPONIN I (HIGH SENSITIVITY)    EKG None  Radiology No results found.  Procedures Procedures  {Document cardiac monitor, telemetry assessment procedure when appropriate:1}  Medications Ordered in ED Medications  ipratropium-albuterol (DUONEB) 0.5-2.5 (3) MG/3ML nebulizer solution 3 mL (3 mLs Nebulization Given 04/18/23 0622)  albuterol (PROVENTIL) (2.5 MG/3ML) 0.083% nebulizer solution 2.5 mg (2.5 mg Nebulization Given 04/18/23 0622)    ED Course/ Medical Decision Making/ A&P   {   Click here for ABCD2, HEART and other calculatorsREFRESH Note before signing :1}                              Medical Decision Making Amount and/or Complexity of Data Reviewed Labs: ordered. Radiology: ordered.  Risk Prescription drug management.   ***  {Document critical care time when appropriate:1} {Document review of labs and clinical decision tools ie heart score, Chads2Vasc2 etc:1}  {Document your independent review of radiology images, and any outside records:1} {Document your discussion with family members, caretakers, and with consultants:1} {Document social determinants of health affecting pt's care:1} {Document your decision making why or why not admission, treatments were needed:1} Final Clinical Impression(s) / ED Diagnoses Final diagnoses:  None    Rx / DC Orders ED Discharge Orders     None

## 2023-04-18 NOTE — Discharge Instructions (Signed)
Continue to use your current medications.  Use the inhaler every 4-6 hours as needed for cough and wheezing.  You can use the cough medicine but it may make you drowsy so you may want to use it at night.  If you start having severe shortness of breath, are unable to hold anything down or running high fever return to the emergency room.

## 2023-04-18 NOTE — ED Provider Notes (Signed)
Assumed care from Dr. Judd Lien at 7 AM.  Patient with URI symptoms with persistent cough, headache, posttussive emesis and feeling generally unwell.  Patient received albuterol and Atrovent initially with some improvement however on repeat evaluation patient has persistent cough, reporting feeling generally unwell.  Sats are 95% on room air but still has decreased breath sounds and expiratory wheezing.  I independently interpreted patient's labs and CBC today with leukocytosis of 15 most likely related to initiation of steroids yesterday.  BNP, troponin, BMP without acute findings.  I have independently visualized and interpreted pt's images today.  Chest x-ray without evidence of pneumonia but radiology reports mild diffuse intra additional prominence and peribronchial cuffing suggestive of acute bronchitis.  Patient started on doxycycline yesterday as well.  Patient did take his blood pressure medication prior to arrival and now blood pressure is improved to 162/83.  Will give a second treatment, IV fluids, Tylenol and reassess. 11:00 AM Patient is now complaining of upper abdominal pain and has had intermittent posttussive emesis.  He has also had some dry heaving.  He is restless in the bed and just reports the upper abdominal pain that started after arrival.  He states in the past he has had this at times when his blood pressure is elevated.  Patient remains hypertensive here initially had mild improvement but now blood pressures are consistently 180-190/100s and pain on abdominal exam is in the epigastric area.  No lower abdominal pain.  Patient sats remain normal and wheezing has improved after second albuterol and Atrovent treatment.  Will give Pepcid, 1 more dose of pain medication and hydralazine and reassess.  2:31 PM After second dose of medication patient reports he is now feeling better.  Sats remained greater than 90% his entire stay.  Blood pressure is still elevated but slowly improving.  Breath  sounds are clear at this time.  Patient was encouraged to continue his current blood pressure medications as well as medications prescribed by his PCP.  He was given an inhaler to use as needed.  He was given return precautions.   Gwyneth Sprout, MD 04/18/23 [phone]

## 2023-04-18 NOTE — ED Notes (Signed)
RT Note: Patient given an Albuterol inhaler with a spacer to take home. Patient educated on the use of the inhaler and the spacer.

## 2023-04-18 NOTE — ED Triage Notes (Signed)
Pt has been coughing and feeling bad for the 4 th day. Went to his pcp yesterday and was given Doxycycline and Deltasone. Said that they took a chest x-ray and swabbed him and he was negative for covid.  Today his blood pressure is up and he does not feel well.

## 2023-04-18 NOTE — ED Triage Notes (Signed)
Pt was here earlier today, dc with bronchitis. Came back for admission. Pt states "he is too sick and has been waiting in the lobby to be seen again". Pt aggravated and poor historian. Vomited in triage.

## 2023-04-19 ENCOUNTER — Inpatient Hospital Stay (HOSPITAL_COMMUNITY)
Admission: EM | Admit: 2023-04-19 | Discharge: 2023-04-21 | DRG: 191 | Disposition: A | Discharge status: Home / Self Care | Payer: Medicare PPO | Attending: Family Medicine | Admitting: Family Medicine

## 2023-04-19 ENCOUNTER — Encounter (HOSPITAL_COMMUNITY): Payer: Self-pay | Admitting: Internal Medicine

## 2023-04-19 ENCOUNTER — Other Ambulatory Visit: Payer: Self-pay

## 2023-04-19 DIAGNOSIS — F32A Depression, unspecified: Secondary | ICD-10-CM | POA: Diagnosis present

## 2023-04-19 DIAGNOSIS — Z7982 Long term (current) use of aspirin: Secondary | ICD-10-CM

## 2023-04-19 DIAGNOSIS — Z1152 Encounter for screening for COVID-19: Secondary | ICD-10-CM | POA: Diagnosis not present

## 2023-04-19 DIAGNOSIS — E876 Hypokalemia: Secondary | ICD-10-CM | POA: Diagnosis present

## 2023-04-19 DIAGNOSIS — E871 Hypo-osmolality and hyponatremia: Secondary | ICD-10-CM | POA: Diagnosis present

## 2023-04-19 DIAGNOSIS — D72829 Elevated white blood cell count, unspecified: Secondary | ICD-10-CM | POA: Diagnosis present

## 2023-04-19 DIAGNOSIS — Z720 Tobacco use: Secondary | ICD-10-CM | POA: Diagnosis present

## 2023-04-19 DIAGNOSIS — Z8249 Family history of ischemic heart disease and other diseases of the circulatory system: Secondary | ICD-10-CM | POA: Diagnosis not present

## 2023-04-19 DIAGNOSIS — T502X5A Adverse effect of carbonic-anhydrase inhibitors, benzothiadiazides and other diuretics, initial encounter: Secondary | ICD-10-CM | POA: Diagnosis present

## 2023-04-19 DIAGNOSIS — I10 Essential (primary) hypertension: Secondary | ICD-10-CM | POA: Insufficient documentation

## 2023-04-19 DIAGNOSIS — Z833 Family history of diabetes mellitus: Secondary | ICD-10-CM | POA: Diagnosis not present

## 2023-04-19 DIAGNOSIS — F17210 Nicotine dependence, cigarettes, uncomplicated: Secondary | ICD-10-CM | POA: Diagnosis present

## 2023-04-19 DIAGNOSIS — B9789 Other viral agents as the cause of diseases classified elsewhere: Secondary | ICD-10-CM | POA: Diagnosis present

## 2023-04-19 DIAGNOSIS — D696 Thrombocytopenia, unspecified: Secondary | ICD-10-CM | POA: Diagnosis present

## 2023-04-19 DIAGNOSIS — G8929 Other chronic pain: Secondary | ICD-10-CM | POA: Diagnosis present

## 2023-04-19 DIAGNOSIS — J441 Chronic obstructive pulmonary disease with (acute) exacerbation: Secondary | ICD-10-CM | POA: Diagnosis present

## 2023-04-19 DIAGNOSIS — Z79899 Other long term (current) drug therapy: Secondary | ICD-10-CM

## 2023-04-19 DIAGNOSIS — Z85828 Personal history of other malignant neoplasm of skin: Secondary | ICD-10-CM | POA: Diagnosis not present

## 2023-04-19 DIAGNOSIS — M1990 Unspecified osteoarthritis, unspecified site: Secondary | ICD-10-CM | POA: Diagnosis present

## 2023-04-19 DIAGNOSIS — R0989 Other specified symptoms and signs involving the circulatory and respiratory systems: Secondary | ICD-10-CM

## 2023-04-19 DIAGNOSIS — R059 Cough, unspecified: Secondary | ICD-10-CM

## 2023-04-19 LAB — COMPREHENSIVE METABOLIC PANEL
ALT: 17 U/L (ref 0–44)
AST: 24 U/L (ref 15–41)
Albumin: 4.2 g/dL (ref 3.5–5.0)
Alkaline Phosphatase: 68 U/L (ref 38–126)
Anion gap: 10 (ref 5–15)
BUN: 12 mg/dL (ref 8–23)
CO2: 24 mmol/L (ref 22–32)
Calcium: 10.2 mg/dL (ref 8.9–10.3)
Chloride: 96 mmol/L — ABNORMAL LOW (ref 98–111)
Creatinine, Ser: 0.8 mg/dL (ref 0.61–1.24)
GFR, Estimated: >60 mL/min (ref >60)
Glucose, Bld: 129 mg/dL — ABNORMAL HIGH (ref 70–99)
Potassium: 3.2 mmol/L — ABNORMAL LOW (ref 3.5–5.1)
Sodium: 130 mmol/L — ABNORMAL LOW (ref 135–145)
Total Bilirubin: 0.7 mg/dL (ref <1.2)
Total Protein: 7.6 g/dL (ref 6.5–8.1)

## 2023-04-19 LAB — CBC WITH DIFFERENTIAL/PLATELET
Band Neutrophils: 1 %
Basophils Absolute: 0 10*3/uL (ref 0.0–0.1)
Basophils Relative: 0 %
Eosinophils Absolute: 0 10*3/uL (ref 0.0–0.5)
Eosinophils Relative: 0 %
HCT: 41.2 % (ref 39.0–52.0)
Hemoglobin: 14.4 g/dL (ref 13.0–17.0)
Lymphocytes Relative: 11 %
Lymphs Abs: 1.5 10*3/uL (ref 0.7–4.0)
MCH: 32 pg (ref 26.0–34.0)
MCHC: 35 g/dL (ref 30.0–36.0)
MCV: 91.6 fL (ref 80.0–100.0)
Monocytes Absolute: 1.2 10*3/uL — ABNORMAL HIGH (ref 0.1–1.0)
Monocytes Relative: 8 %
Neutro Abs: 11.6 10*3/uL — ABNORMAL HIGH (ref 1.7–7.7)
Neutrophils Relative %: 80 %
Platelets: 104 10*3/uL — ABNORMAL LOW (ref 150–400)
RBC: 4.5 MIL/uL (ref 4.22–5.81)
RDW: 12.7 % (ref 11.5–15.5)
WBC: 14.5 10*3/uL — ABNORMAL HIGH (ref 4.0–10.5)
nRBC: 0 % (ref 0.0–0.2)

## 2023-04-19 LAB — RESP PANEL BY RT-PCR (RSV, FLU A&B, COVID)  RVPGX2
Influenza A by PCR: NEGATIVE
Influenza B by PCR: NEGATIVE
Resp Syncytial Virus by PCR: NEGATIVE
SARS Coronavirus 2 by RT PCR: NEGATIVE

## 2023-04-19 LAB — PHOSPHORUS: Phosphorus: 2.6 mg/dL (ref 2.5–4.6)

## 2023-04-19 LAB — MAGNESIUM: Magnesium: 2 mg/dL (ref 1.7–2.4)

## 2023-04-19 MED ORDER — MELATONIN 3 MG PO TABS: 3.0000 mg | ORAL_TABLET | Freq: Every evening | ORAL | Status: DC | PRN

## 2023-04-19 MED ORDER — HYDROCHLOROTHIAZIDE 12.5 MG PO TABS
12.5000 mg | ORAL_TABLET | Freq: Every day | ORAL | Status: DC
  Administered 2023-04-19 – 2023-04-21 (×3): 12.5 mg via ORAL
  Filled 2023-04-19 (×3): qty 1

## 2023-04-19 MED ORDER — IPRATROPIUM-ALBUTEROL 0.5-2.5 (3) MG/3ML IN SOLN
9.0000 mL | Freq: Once | RESPIRATORY_TRACT | Status: AC
  Administered 2023-04-19: 9 mL via RESPIRATORY_TRACT
  Filled 2023-04-19: qty 9

## 2023-04-19 MED ORDER — ONDANSETRON HCL 4 MG/2ML IJ SOLN
4.0000 mg | Freq: Four times a day (QID) | INTRAMUSCULAR | Status: DC | PRN
  Administered 2023-04-19 – 2023-04-20 (×2): 4 mg via INTRAVENOUS
  Filled 2023-04-19 (×2): qty 2

## 2023-04-19 MED ORDER — MAGNESIUM OXIDE -MG SUPPLEMENT 400 (240 MG) MG PO TABS
800.0000 mg | ORAL_TABLET | Freq: Once | ORAL | Status: AC
  Administered 2023-04-19: 800 mg via ORAL
  Filled 2023-04-19: qty 2

## 2023-04-19 MED ORDER — PREDNISONE 20 MG PO TABS
40.0000 mg | ORAL_TABLET | Freq: Every day | ORAL | Status: DC
  Administered 2023-04-20 – 2023-04-21 (×2): 40 mg via ORAL
  Filled 2023-04-19 (×2): qty 2

## 2023-04-19 MED ORDER — ALBUTEROL SULFATE (2.5 MG/3ML) 0.083% IN NEBU
10.0000 mg/h | INHALATION_SOLUTION | Freq: Once | RESPIRATORY_TRACT | Status: AC
  Administered 2023-04-19: 10 mg/h via RESPIRATORY_TRACT
  Filled 2023-04-19: qty 3

## 2023-04-19 MED ORDER — METHYLPREDNISOLONE SODIUM SUCC 125 MG IJ SOLR
125.0000 mg | Freq: Once | INTRAMUSCULAR | Status: AC
  Administered 2023-04-19: 125 mg via INTRAVENOUS
  Filled 2023-04-19: qty 2

## 2023-04-19 MED ORDER — ALBUTEROL SULFATE (2.5 MG/3ML) 0.083% IN NEBU: 2.5000 mg | INHALATION_SOLUTION | RESPIRATORY_TRACT | Status: DC | PRN

## 2023-04-19 MED ORDER — POTASSIUM CHLORIDE CRYS ER 20 MEQ PO TBCR
40.0000 meq | EXTENDED_RELEASE_TABLET | Freq: Once | ORAL | Status: AC
  Administered 2023-04-19: 40 meq via ORAL
  Filled 2023-04-19: qty 4

## 2023-04-19 MED ORDER — LOSARTAN POTASSIUM 50 MG PO TABS
100.0000 mg | ORAL_TABLET | Freq: Every day | ORAL | Status: DC
  Administered 2023-04-19 – 2023-04-21 (×3): 100 mg via ORAL
  Filled 2023-04-19 (×3): qty 2

## 2023-04-19 MED ORDER — IPRATROPIUM-ALBUTEROL 0.5-2.5 (3) MG/3ML IN SOLN
RESPIRATORY_TRACT | Status: AC
  Filled 2023-04-19: qty 6

## 2023-04-19 MED ORDER — IPRATROPIUM-ALBUTEROL 0.5-2.5 (3) MG/3ML IN SOLN
3.0000 mL | Freq: Four times a day (QID) | RESPIRATORY_TRACT | Status: DC
  Administered 2023-04-19 (×3): 3 mL via RESPIRATORY_TRACT
  Filled 2023-04-19 (×3): qty 3

## 2023-04-19 MED ORDER — ACETAMINOPHEN 325 MG PO TABS
650.0000 mg | ORAL_TABLET | Freq: Four times a day (QID) | ORAL | Status: DC | PRN
  Administered 2023-04-20: 650 mg via ORAL
  Filled 2023-04-19: qty 2

## 2023-04-19 MED ORDER — METHYLPREDNISOLONE SODIUM SUCC 125 MG IJ SOLR: 80.0000 mg | Freq: Two times a day (BID) | INTRAMUSCULAR | Status: DC

## 2023-04-19 MED ORDER — IPRATROPIUM-ALBUTEROL 0.5-2.5 (3) MG/3ML IN SOLN
3.0000 mL | Freq: Three times a day (TID) | RESPIRATORY_TRACT | Status: DC
  Administered 2023-04-20 – 2023-04-21 (×4): 3 mL via RESPIRATORY_TRACT
  Filled 2023-04-19 (×5): qty 3

## 2023-04-19 MED ORDER — LOSARTAN POTASSIUM-HCTZ 100-12.5 MG PO TABS: 1.0000 | ORAL_TABLET | Freq: Every day | ORAL | Status: DC

## 2023-04-19 MED ORDER — DOXYCYCLINE HYCLATE 100 MG PO TABS
100.0000 mg | ORAL_TABLET | Freq: Two times a day (BID) | ORAL | Status: DC
  Administered 2023-04-19 – 2023-04-21 (×5): 100 mg via ORAL
  Filled 2023-04-19 (×5): qty 1

## 2023-04-19 MED ORDER — DM-GUAIFENESIN ER 30-600 MG PO TB12
1.0000 | ORAL_TABLET | Freq: Two times a day (BID) | ORAL | Status: DC
  Administered 2023-04-19 – 2023-04-21 (×5): 1 via ORAL
  Filled 2023-04-19 (×5): qty 1

## 2023-04-19 MED ORDER — HYDRALAZINE HCL 20 MG/ML IJ SOLN
10.0000 mg | INTRAMUSCULAR | Status: DC | PRN
  Administered 2023-04-20: 10 mg via INTRAVENOUS
  Filled 2023-04-19: qty 1

## 2023-04-19 MED ORDER — ACETAMINOPHEN 650 MG RE SUPP: 650.0000 mg | Freq: Four times a day (QID) | RECTAL | Status: DC | PRN

## 2023-04-19 MED ORDER — POTASSIUM CHLORIDE CRYS ER 20 MEQ PO TBCR
40.0000 meq | EXTENDED_RELEASE_TABLET | Freq: Once | ORAL | Status: AC
  Administered 2023-04-19: 40 meq via ORAL
  Filled 2023-04-19: qty 2

## 2023-04-19 MED ORDER — TAMSULOSIN HCL 0.4 MG PO CAPS
0.4000 mg | ORAL_CAPSULE | Freq: Every day | ORAL | Status: DC
  Administered 2023-04-19 – 2023-04-21 (×3): 0.4 mg via ORAL
  Filled 2023-04-19 (×3): qty 1

## 2023-04-19 MED ORDER — NICOTINE 21 MG/24HR TD PT24: 21.0000 mg | MEDICATED_PATCH | Freq: Every day | TRANSDERMAL | Status: DC | PRN

## 2023-04-19 NOTE — ED Triage Notes (Addendum)
Pt to ED by EMS from home with c/o productive cough for the past 3 days. Pt has been seen twice in the past 2 days at other facilities for the same. Arrives A+O, VSS, NADN. Pt states he has nausea and abdominal pain from coughing.

## 2023-04-19 NOTE — H&P (Signed)
History and Physical    Patient: Don [name] WCH:[id number] DOB: 1956-04-08 DOA: 04/19/2023 DOS: the patient was seen and examined on 04/19/2023 PCP: Alveria Apley, NP  Patient coming from: Home  Chief Complaint:  Chief Complaint  Patient presents with   Cough   HPI: Don [name] is a 67 y.o. male with medical history significant of osteoarthritis, squamous cell cancer, chronic pain, COPD, depression, hypertension, renal infarct, skin cancer, grade 1 diastolic dysfunction, active smoker who presented to the emergency department complaints of wheezing, dyspnea, productive cough for the past 3 days.  No sick contacts or travel history.  No fever or hemoptysis.  No chest pain, palpitations, diaphoresis, PND, orthopnea or pitting edema of the lower extremities.  No abdominal pain, nausea, emesis, diarrhea, constipation, melena or hematochezia.  No flank pain, dysuria, frequency or hematuria.  No polyuria, polydipsia, polyphagia or blurred vision.   Lab work: Coronavirus, influenza and RSV PCR negative.  CBC is her white count 14.5, hemoglobin 14.4 g/dL platelets 353.  CMP showed a sodium 130, potassium 3.2, chloride 96 and CO2 24 mmol/L.  Glucose was 129 mg/dL.  Calcium level, renal and hepatic functions were normal.  Imaging: Portable 1 view chest radiograph with mild diffuse interstitial prominence and peribronchial cuffing suggesting acute bronchitis.  Aortic atherosclerosis.   ED course: Initial vital signs were temperature 98.5 F, pulse 91, respirations 16, BP 177/124 mmHg O2 sat 97% on room air.  Patient received a continuous 10 mg albuterol neb, a DuoNeb, magnesium oxide 800 mg p.o. x 1, methylprednisolone 125 mg x 1 and KCl 40 mEq p.o. x 2.  Review of Systems: As mentioned in the history of present illness. All other systems reviewed and are negative. Past Medical History:  Diagnosis Date   Arthritis    Cancer (HCC)    squamous cell "all in my face arms and hands"    Chronic pain    UDS+ on 06/09/22 for opiates and amphetamines; pt not currently prescribed any   COPD (chronic obstructive pulmonary disease) (HCC)    Depression    Hypertension    admitted 06/09/22-06/11/22 for hypertensive urgency; ran out of BP meds   Renal infarct (HCC) 06/10/2022   Skin cancer    Past Surgical History:  Procedure Laterality Date   cartilage removed from both knees     EXCISION MASS HEAD Right 08/28/2022   Procedure: EXCISION OF EAR WOUND;  Surgeon: Scarlette Ar, MD;  Location: [facility] OR;  Service: ENT;  Laterality: Right;   EXCISION MASS HEAD N/A 10/03/2022   Procedure: EXCISION WOUNDHEAD LESS THAN 100 CM^2 FOR FLAP;  Surgeon: Scarlette Ar, MD;  Location: [facility] OR;  Service: ENT;  Laterality: N/A;   KNEE ARTHROSCOPY     left wrist     SKIN CANCER EXCISION     SKIN FULL THICKNESS GRAFT Right 08/28/2022   Procedure: RIGHT EXCISION OF EAR WOUND, ADJACENT TISSUE TRANSFER, RIGHT SKIN GRAFT FROM NECK TO EAR;  Surgeon: Scarlette Ar, MD;  Location: [facility] OR;  Service: ENT;  Laterality: Right;   SKIN FULL THICKNESS GRAFT N/A 10/03/2022   Procedure: ADJACENT TISSUE TRANSFER LEFT CHEEK AND RIGHT EAR, FULL THICKNESS SKIN GRAFT LEFT NECK TO LEFT LOWER EYELID AND RIGHT EAR;  Surgeon: Scarlette Ar, MD;  Location: [facility] OR;  Service: ENT;  Laterality: N/A;   Social History:  reports that he has been smoking cigarettes. He has a 15 pack-year smoking history. He has been exposed to tobacco smoke. He has never used  smokeless tobacco. He reports that he does not drink alcohol and does not use drugs.  No Known Allergies  Family History  Problem Relation Age of Onset   Cancer Father        Skin   Diabetes Father    Hypertension Father     Prior to Admission medications   Medication Sig Start Date End Date Taking? Authorizing Provider  acetaminophen (TYLENOL) 500 MG tablet Take 1 tablet (500 mg total) by mouth every 6 (six) hours as needed. 08/28/22  Yes Scarlette Ar, MD  albuterol (VENTOLIN  HFA) 108 (90 Base) MCG/ACT inhaler Inhale 1-2 puffs into the lungs every 4 (four) hours as needed for wheezing or shortness of breath. 06/11/22  Yes Standley Brooking, MD  diphenhydrAMINE (BENADRYL) 25 MG tablet Take 25 mg by mouth daily as needed for allergies or itching.   Yes [provider]  doxycycline (VIBRAMYCIN) 100 MG capsule Take 1 capsule (100 mg total) by mouth 2 (two) times daily. 04/16/23  Yes Nafziger, Kandee Keen, NP  losartan-hydrochlorothiazide (HYZAAR) 100-12.5 MG tablet Take 1 tablet by mouth daily. 03/31/23  Yes Alveria Apley, NP  predniSONE (DELTASONE) 10 MG tablet 40 mg x 3 days, 20 mg x 3 days, 10 mg x 3 days 04/17/23  Yes Nafziger, Kandee Keen, NP  tamsulosin (FLOMAX) 0.4 MG CAPS capsule Take 1 capsule (0.4 mg total) by mouth daily. 02/07/23  Yes Alveria Apley, NP  aspirin 81 MG chewable tablet Chew 1 tablet (81 mg total) by mouth daily. Patient not taking: Reported on 04/19/2023 06/12/22   Standley Brooking, MD  benzonatate (TESSALON) 100 MG capsule Take 1 capsule (100 mg total) by mouth 2 (two) times daily as needed for cough. Patient not taking: Reported on 04/19/2023 08/09/22   Alveria Apley, NP  diltiazem (CARDIZEM CD) 120 MG 24 hr capsule Take by mouth. Patient not taking: Reported on 04/19/2023 03/15/11   [provider]  HYDROcodone bit-homatropine (HYCODAN) 5-1.5 MG/5ML syrup Take 5 mLs by mouth every 6 (six) hours as needed for cough. Patient not taking: Reported on 04/19/2023 04/18/23   Gwyneth Sprout, MD  ibuprofen (ADVIL) 200 MG tablet Take 800 mg by mouth every 6 (six) hours as needed for moderate pain. Patient not taking: Reported on 04/19/2023    [provider]  Multiple Vitamins-Minerals (MULTIVITAMIN WITH MINERALS) tablet Take 1 tablet by mouth daily. Patient not taking: Reported on 04/19/2023    [provider]    Physical Exam: Vitals:   04/19/23 0600 04/19/23 0630 04/19/23 0700 04/19/23 0818  BP: (!) 162/85  (!) 160/91 (!) 167/103   Pulse: 79 90 81   Resp: 16 17 16    Temp:  98 F (36.7 C)    TempSrc:  Oral    SpO2: 92% 96% 99% 98%   Physical Exam Vitals and nursing note reviewed.  Constitutional:      General: He is awake. He is not in acute distress.    Appearance: He is normal weight. He is ill-appearing.  HENT:     Head: Normocephalic.     Nose: No rhinorrhea.     Mouth/Throat:     Mouth: Mucous membranes are moist.  Eyes:     General: No scleral icterus.    Pupils: Pupils are equal, round, and reactive to light.  Neck:     Vascular: No JVD.  Cardiovascular:     Rate and Rhythm: Normal rate and regular rhythm.     Heart sounds: S1 normal  and S2 normal.  Pulmonary:     Breath sounds: Wheezing present. No rhonchi or rales.  Abdominal:     General: Bowel sounds are normal. There is no distension.     Palpations: Abdomen is soft.     Tenderness: There is no abdominal tenderness.  Musculoskeletal:     Cervical back: Neck supple.     Right lower leg: No edema.     Left lower leg: No edema.  Skin:    General: Skin is warm and dry.  Neurological:     General: No focal deficit present.     Mental Status: He is alert and oriented to person, place, and time.  Psychiatric:        Mood and Affect: Mood normal.        Behavior: Behavior normal. Behavior is cooperative.     Data Reviewed:  Results are pending, will review when available. 06/10/2022 transthoracic echocardiogram IMPRESSIONS:   1. Left ventricular ejection fraction, by estimation, is 55 to 60%. The  left ventricle has normal function. The left ventricle has no regional  wall motion abnormalities. There is mild left ventricular hypertrophy of  the basal-septal segment. Left  ventricular diastolic parameters are consistent with Grade I diastolic  dysfunction (impaired relaxation).   2. Right ventricular systolic function is normal. The right ventricular  size is normal.   3. The mitral valve is grossly normal.  No evidence of mitral valve  regurgitation. No evidence of mitral stenosis.   4. The aortic valve is tricuspid. Aortic valve regurgitation is not  visualized. No aortic stenosis is present.   5. The inferior vena cava is normal in size with greater than 50%  respiratory variability, suggesting right atrial pressure of 3 mmHg.   EKG: Vent. rate 95 BPM PR interval 150 ms QRS duration 103 ms QT/QTcB 370/466 ms P-R-T axes 85 41 66 Sinus rhythm Normal ECG  Assessment and Plan: Principal Problem:   Acute exacerbation of chronic obstructive pulmonary disease (COPD) (HCC) Observation/PCU. Continue supplemental oxygen. Methylprednisolone 125 mg IVP x1. Followed by prednisone 40 mg p.o. daily in a.m. Scheduled and as needed bronchodilators. Follow-up CBC and chemistry in the morning.   Active Problems:   Tobacco abuse Tobacco cessation advised. Declined nicotine replacement therapy for now. Nicotine replacement therapy ordered as needed.    Hyponatremia Secondary to HCTZ use. Monitor sodium level. Hold HCTZ if it gets lower.    Hypokalemia Replenished. Recommended regular potassium supplementation.    Thrombocytopenia (HCC) Unknown significance at this time. Monitor platelet count. Recommend outpatient hematology evaluation if persistent.     Advance Care Planning:   Code Status: Full Code   Consults:   Family Communication:   Severity of Illness: The appropriate patient status for this patient is INPATIENT. Inpatient status is judged to be reasonable and necessary in order to provide the required intensity of service to ensure the patient's safety. The patient's presenting symptoms, physical exam findings, and initial radiographic and laboratory data in the context of their chronic comorbidities is felt to place them at high risk for further clinical deterioration. Furthermore, it is not anticipated that the patient will be medically stable for discharge from the  hospital within 2 midnights of admission.   * I certify that at the point of admission it is my clinical judgment that the patient will require inpatient hospital care spanning beyond 2 midnights from the point of admission due to high intensity of service, high risk for further deterioration and high  frequency of surveillance required.*  Author: Bobette Mo, MD 04/19/2023 9:42 AM  For on call review [email or web address].   This document was prepared using Dragon voice recognition software and may contain some unintended transcription errors.

## 2023-04-19 NOTE — Progress Notes (Signed)
  Carryover admission to the Day Admitter.  I discussed this case with the EDP, Dr. Posey Rea.  Per these discussions:   This is a 67 year old male with history of COPD, who is being admitted with acute COPD exacerbation after presenting with progressive shortness of breath, wheezing over the last few days.  He presented with similar symptoms yesterday and [facility], at which time there was consideration for admission for acute COPD exacerbation.  Ultimately, the patient was discharged to home from [facility], but is experienced further progression in his symptoms in the interval, prompting him to present to [facility] emergency department this evening for further evaluation and management of such.  Vital signs notable for afebrile, oxygen saturations in the range of 95 to 100% on room air.  He has received nebulizer treatments, solumedrol, following which his respiratory symptoms are improving but he remains short of breath, with respiratory status not yet back to baseline.  I have placed an order for inpatient admission to PCU for further management of acute COPD exacerbation.  I have placed some additional preliminary admit orders via the adult multi-morbid admission order set. I have also ordered additional Solu-Medrol, scheduled duo nebulizer treatments, prn albuterol nebulizers.  Have added on serum magnesium and phosphorus levels.    [name], DO Hospitalist

## 2023-04-19 NOTE — ED Provider Notes (Signed)
[place] [facility] AT [facility] Provider Note  CSN: [id number] Arrival date & time: 04/19/23 0000  Chief Complaint(s) Cough  HPI Don JOSEPHSEN is a 67 y.o. male with PMH COPD, depression who presents emergency room for evaluation of cough and shortness of breath.  States that he has had productive cough and shortness of breath for the last 3 days.  He was seen twice in the past 2 days at [facility] and received DuoNeb therapy and ultimately felt a little bit better.  He was discharged home but symptoms worsened at home and he returns to the emergency department with significant shortness of breath cough and wheezing.  Currently denies abdominal pain, nausea, vomiting, headache, fever or other systemic symptoms.   Past Medical History 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   Skin cancer    Patient Active Problem List   Diagnosis Date Noted   Acquired deformity of face 10/08/2022   Tobacco abuse counseling 08/09/2022   Acquired deformity of right ear 07/22/2022   Tobacco abuse 07/22/2022   Squamous cell carcinoma, ear, 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   Abdominal pain 06/10/2022   Abnormal CT scan, kidney 06/10/2022   Substance use 06/10/2022   Renal infarct (HCC) 06/10/2022   Aortic atherosclerosis (HCC) 06/10/2022   History of elevated glucose 10/03/2021   Nocturia 10/03/2021   Smokes less than 1 pack a day with greater than 30 pack year history 10/03/2021   Skin cancer of forehead 09/27/2020   Squamous cell skin cancer, face 09/27/2020   Primary hypertension 02/07/2014   Osteoarthritis 02/07/2014    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 Authorizing Provider  acetaminophen (TYLENOL) 500 MG tablet Take 1 tablet (500 mg total) by mouth every 6 (six) hours as needed. 08/28/22   Scarlette Ar, MD  albuterol (VENTOLIN HFA) 108 (90 Base) MCG/ACT inhaler Inhale 1-2 puffs into the lungs every 4 (four) hours as needed for wheezing or shortness of breath. 06/11/22   Standley Brooking, MD  aspirin 81 MG chewable tablet Chew 1 tablet (81 mg total) by mouth daily. 06/12/22   Standley Brooking, MD  benzonatate (TESSALON) 100 MG capsule Take 1 capsule (100 mg total) by mouth 2 (two) times daily as needed for cough. 08/09/22   Alveria Apley, NP  diltiazem (CARDIZEM CD) 120 MG 24 hr capsule Take by mouth. 03/15/11   [provider]  diphenhydrAMINE (BENADRYL) 25 MG tablet Take 25 mg by mouth daily as needed for allergies or itching.    [provider]  doxycycline (VIBRAMYCIN) 100 MG capsule Take 1 capsule (100 mg total) by mouth 2 (two) times daily. 04/16/23   Nafziger,  Kandee Keen, NP  HYDROcodone bit-homatropine (HYCODAN) 5-1.5 MG/5ML syrup Take 5 mLs by mouth every 6 (six) hours as needed for cough. 04/18/23   Gwyneth Sprout, MD  ibuprofen (ADVIL) 200 MG tablet Take 800 mg by mouth every 6 (six) hours as needed for moderate pain.    [provider]  losartan-hydrochlorothiazide (HYZAAR) 100-12.5 MG tablet Take 1 tablet by mouth daily. 03/31/23   Alveria Apley, NP  Multiple Vitamins-Minerals (MULTIVITAMIN WITH MINERALS) tablet Take 1 tablet by mouth daily.     [provider]  predniSONE (DELTASONE) 10 MG tablet 40 mg x 3 days, 20 mg x 3 days, 10 mg x 3 days 04/17/23   Shirline Frees, NP  tamsulosin (FLOMAX) 0.4 MG CAPS capsule Take 1 capsule (0.4 mg total) by mouth daily. 02/07/23   Alveria Apley, NP                                                                                                                                    Past Surgical History Past Surgical History:  Procedure Laterality Date   cartilage removed from both knees     EXCISION MASS HEAD Right 08/28/2022   Procedure: EXCISION OF EAR WOUND;  Surgeon: Scarlette Ar, MD;  Location: [facility] OR;  Service: ENT;  Laterality: Right;   EXCISION MASS HEAD N/A 10/03/2022   Procedure: EXCISION WOUNDHEAD LESS THAN 100 CM^2 FOR FLAP;  Surgeon: Scarlette Ar, MD;  Location: [facility] OR;  Service: ENT;  Laterality: N/A;   KNEE ARTHROSCOPY     left wrist     SKIN CANCER EXCISION     SKIN FULL THICKNESS GRAFT Right 08/28/2022   Procedure: RIGHT EXCISION OF EAR WOUND, ADJACENT TISSUE TRANSFER, RIGHT SKIN GRAFT FROM NECK TO EAR;  Surgeon: Scarlette Ar, MD;  Location: [facility] OR;  Service: ENT;  Laterality: Right;   SKIN FULL THICKNESS GRAFT N/A 10/03/2022   Procedure: ADJACENT TISSUE TRANSFER LEFT CHEEK AND RIGHT EAR, FULL THICKNESS SKIN GRAFT LEFT NECK TO LEFT LOWER EYELID AND RIGHT EAR;  Surgeon: Scarlette Ar, MD;  Location: [facility] OR;  Service: ENT;  Laterality: N/A;   Family History Family History  Problem Relation Age of Onset   Cancer Father        Skin   Diabetes Father    Hypertension Father     Social History Social History   Tobacco Use   Smoking status: Every Day    Current packs/day: 0.50    Average packs/day: 0.5 packs/day for 30.0 years (15.0 ttl pk-yrs)    Types: Cigarettes    Passive exposure: Current   Smokeless tobacco: Never  Vaping Use   Vaping status: Never Used  Substance Use Topics   Alcohol use: No   Drug use: No   Allergies Patient has no known  allergies.  Review of Systems Review of Systems  Respiratory:  Positive for cough, chest tightness and shortness of breath.  Physical Exam Vital Signs  I have reviewed the triage vital signs BP (!) 167/103   Pulse 90   Temp 97.9 F (36.6 C) (Oral)   Resp 16   SpO2 100%   Physical Exam Vitals and nursing note reviewed.  Constitutional:      General: He is not in acute distress.    Appearance: He is well-developed.  HENT:     Head: Normocephalic and atraumatic.  Eyes:     Conjunctiva/sclera: Conjunctivae normal.  Cardiovascular:     Rate and Rhythm: Normal rate and regular rhythm.     Heart sounds: No murmur heard. Pulmonary:     Effort: Pulmonary effort is normal. No respiratory distress.     Breath sounds: Wheezing present.  Abdominal:     Palpations: Abdomen is soft.     Tenderness: There is no abdominal tenderness.  Musculoskeletal:        General: No swelling.     Cervical back: Neck supple.  Skin:    General: Skin is warm and dry.     Capillary Refill: Capillary refill takes less than 2 seconds.  Neurological:     Mental Status: He is alert.  Psychiatric:        Mood and Affect: Mood normal.     ED Results and Treatments Labs (all labs ordered are listed, but only abnormal results are displayed) Labs Reviewed  COMPREHENSIVE METABOLIC PANEL - Abnormal; Notable for the following components:      Result Value   Sodium 130 (*)    Potassium 3.2 (*)    Chloride 96 (*)    Glucose, Bld 129 (*)    All other components within normal limits  CBC WITH DIFFERENTIAL/PLATELET - Abnormal; Notable for the following components:   WBC 14.5 (*)    Platelets 104 (*)    Neutro Abs 11.6 (*)    Monocytes Absolute 1.2 (*)    All other components within normal limits  RESP PANEL BY RT-PCR (RSV, FLU A&B, COVID)  RVPGX2                                                                                                                          Radiology DG Chest Port 1  View  Result Date: 04/18/2023 CLINICAL DATA:  67 year old male with history of shortness of breath. EXAM: PORTABLE CHEST 1 VIEW COMPARISON:  Chest x-ray 04/16/2023. FINDINGS: Lung volumes are normal. Mild diffuse interstitial prominence and peribronchial cuffing. No consolidative airspace disease. No pleural effusions. No pneumothorax. No pulmonary nodule or mass noted. Pulmonary vasculature and the cardiomediastinal silhouette are within normal limits. Atherosclerosis in the thoracic aorta. IMPRESSION: 1. Mild diffuse interstitial prominence and peribronchial cuffing, which may suggest an acute bronchitis. 2. Aortic atherosclerosis. Electronically Signed   By: Trudie Reed M.D.   On: 04/18/2023 06:50    Pertinent labs & imaging results that were available during my care of the patient were reviewed by me and considered in my medical decision  making (see MDM for details).  Medications Ordered in ED Medications  ipratropium-albuterol (DUONEB) 0.5-2.5 (3) MG/3ML nebulizer solution (  Not Given 04/19/23 0324)  ipratropium-albuterol (DUONEB) 0.5-2.5 (3) MG/3ML nebulizer solution 9 mL (9 mLs Nebulization Given 04/19/23 0137)  methylPREDNISolone sodium succinate (SOLU-MEDROL) 125 mg/2 mL injection 125 mg (125 mg Intravenous Given 04/19/23 0152)  albuterol (PROVENTIL) (2.5 MG/3ML) 0.083% nebulizer solution (10 mg/hr Nebulization Given 04/19/23 0357)                                                                                                                                     Procedures .Critical Care  Performed by: Glendora Score, MD Authorized by: Glendora Score, MD   Critical care provider statement:    Critical care time (minutes):  30   Critical care was necessary to treat or prevent imminent or life-threatening deterioration of the following conditions:  Respiratory failure   Critical care was time spent personally by me on the following activities:  Development of treatment plan with  patient or surrogate, discussions with consultants, evaluation of patient's response to treatment, examination of patient, ordering and review of laboratory studies, ordering and review of radiographic studies, ordering and performing treatments and interventions, pulse oximetry, re-evaluation of patient's condition and review of old charts   (including critical care time)  Medical Decision Making / ED Course   This patient presents to the ED for concern of dyspnea wheezing, this involves an extensive number of treatment options, and is a complaint that carries with it a high risk of complications and morbidity.  The differential diagnosis includes Pe, PTX, Pulmonary Edema, ARDS, COPD/Asthma, ACS, CHF exacerbation, Arrhythmia, Pericardial Effusion/Tamponade, Anemia, Sepsis, Acidosis/Hypercapnia, Anxiety, Viral URI  MDM: Patient seen emergency room for evaluation of cough, shortness of breath and wheezing.  Physical exam with bilateral expiratory wheezing and mild accessory muscle use.  Laboratory evaluation with a leukocytosis to 14.5 likely secondary to previous steroid use.  Sodium 130, potassium 3.2 which was repleted in the ER.  COVID, flu, RSV negative.  Chest x-ray yesterday was negative and we will not repeat.  Patient received methylprednisolone, 3 DuoNebs and on reevaluation continues to have wheezing.  He then received a 10 mg continuous albuterol therapy and on second reevaluation states that his symptoms are improving but he is still wheezing on exam.  He will require hospitalization for q4 albuterol therapy and further workup of his COPD exacerbation.   Additional history obtained:  -External records from outside source obtained and reviewed including: Chart review including previous notes, labs, imaging, consultation notes   Lab Tests: -I ordered, reviewed, and interpreted labs.   The pertinent results include:   Labs Reviewed  COMPREHENSIVE METABOLIC PANEL - Abnormal; Notable  for the following components:      Result Value   Sodium 130 (*)    Potassium 3.2 (*)    Chloride 96 (*)    Glucose, Bld  129 (*)    All other components within normal limits  CBC WITH DIFFERENTIAL/PLATELET - Abnormal; Notable for the following components:   WBC 14.5 (*)    Platelets 104 (*)    Neutro Abs 11.6 (*)    Monocytes Absolute 1.2 (*)    All other components within normal limits  RESP PANEL BY RT-PCR (RSV, FLU A&B, COVID)  [id number]      Medicines ordered and prescription drug management: Meds ordered this encounter  Medications   ipratropium-albuterol (DUONEB) 0.5-2.5 (3) MG/3ML nebulizer solution 9 mL   methylPREDNISolone sodium succinate (SOLU-MEDROL) 125 mg/2 mL injection 125 mg   ipratropium-albuterol (DUONEB) 0.5-2.5 (3) MG/3ML nebulizer solution    Ray, Amy D: cabinet override   albuterol (PROVENTIL) (2.5 MG/3ML) 0.083% nebulizer solution    -I have reviewed the patients home medicines and have made adjustments as needed  Critical interventions Multiple DuoNebs, steroids    Cardiac Monitoring: The patient was maintained on a cardiac monitor.  I personally viewed and interpreted the cardiac monitored which showed an underlying rhythm of: NSR  Social Determinants of Health:  Factors impacting patients care include: none   Reevaluation: After the interventions noted above, I reevaluated the patient and found that they have :improved  Co morbidities that complicate the patient evaluation  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   Skin cancer       Dispostion: I considered admission for this  patient, and given persistent wheezing in the setting of a COPD exacerbation patient require hospital admission     Final Clinical Impression(s) / ED Diagnoses Final diagnoses:  None     @PCDICTATION @    Glendora Score, MD 04/19/23 [phone]

## 2023-04-19 NOTE — ED Notes (Signed)
ED TO INPATIENT HANDOFF REPORT  ED Nurse Name and Phone #: Reita Cliche RN  S Name/Age/Gender Don [name] 67 y.o. male Room/Bed: WA16/WA16  Code Status   Code Status: Full Code  Home/SNF/Other Home Patient oriented to: self, place, time, and situation Is this baseline? Yes   Triage Complete: Triage complete  Chief Complaint Acute exacerbation of chronic obstructive pulmonary disease (COPD) (HCC) [J44.1]  Triage Note Pt to ED by EMS from home with c/o productive cough for the past 3 days. Pt has been seen twice in the past 2 days at other facilities for the same. Arrives A+O, VSS, NADN. Pt states he has nausea and abdominal pain from coughing.   Allergies No Known Allergies  Level of Care/Admitting Diagnosis ED Disposition     ED Disposition  Admit   Condition  --   Comment  Hospital Area: [facility] [[id number]]  Level of Care: Progressive [102]  Admit to Progressive based on following criteria: MULTISYSTEM THREATS such as stable sepsis, metabolic/electrolyte imbalance with or without encephalopathy that is responding to early treatment.  May admit patient to [facility] or [facility] if equivalent level of care is available:: No  Covid Evaluation: Asymptomatic - no recent exposure (last 10 days) testing not required  Diagnosis: Acute exacerbation of chronic obstructive pulmonary disease (COPD) [facility]) [[id number]]  Admitting Physician: Angie Fava [[id number]]  Attending Physician: Angie Fava [[id number]]  Certification:: I certify this patient will need inpatient services for at least 2 midnights  Expected Medical Readiness: 04/21/2023          B Medical/Surgery History Past Medical History:  Diagnosis Date   Arthritis    Cancer (HCC)    squamous cell "all in my face arms and hands"   Chronic pain    UDS+ on 06/09/22 for opiates and amphetamines; pt not currently prescribed any   COPD (chronic obstructive pulmonary disease) (HCC)     Depression    Hypertension    admitted 06/09/22-06/11/22 for hypertensive urgency; ran out of BP meds   Renal infarct (HCC) 06/10/2022   Skin cancer    Past Surgical History:  Procedure Laterality Date   cartilage removed from both knees     EXCISION MASS HEAD Right 08/28/2022   Procedure: EXCISION OF EAR WOUND;  Surgeon: Scarlette Ar, MD;  Location: [facility] OR;  Service: ENT;  Laterality: Right;   EXCISION MASS HEAD N/A 10/03/2022   Procedure: EXCISION WOUNDHEAD LESS THAN 100 CM^2 FOR FLAP;  Surgeon: Scarlette Ar, MD;  Location: [facility] OR;  Service: ENT;  Laterality: N/A;   KNEE ARTHROSCOPY     left wrist     SKIN CANCER EXCISION     SKIN FULL THICKNESS GRAFT Right 08/28/2022   Procedure: RIGHT EXCISION OF EAR WOUND, ADJACENT TISSUE TRANSFER, RIGHT SKIN GRAFT FROM NECK TO EAR;  Surgeon: Scarlette Ar, MD;  Location: [facility] OR;  Service: ENT;  Laterality: Right;   SKIN FULL THICKNESS GRAFT N/A 10/03/2022   Procedure: ADJACENT TISSUE TRANSFER LEFT CHEEK AND RIGHT EAR, FULL THICKNESS SKIN GRAFT LEFT NECK TO LEFT LOWER EYELID AND RIGHT EAR;  Surgeon: Scarlette Ar, MD;  Location: [facility] OR;  Service: ENT;  Laterality: N/A;     A IV Location/Drains/Wounds Patient Lines/Drains/Airways Status     Active Line/Drains/Airways     Name Placement date Placement time Site Days   Peripheral IV 04/19/23 20 G Left Antecubital 04/19/23  0152  Antecubital  less than 1  Intake/Output Last 24 hours No intake or output data in the 24 hours ending 04/19/23 1230  Labs/Imaging Results for orders placed or performed during the hospital encounter of 04/19/23 (from the past 48 hour(s))  Resp panel by RT-PCR (RSV, Flu A&B, Covid) Anterior Nasal Swab     Status: None   Collection Time: 04/19/23  1:13 AM   Specimen: Anterior Nasal Swab  Result Value Ref Range   SARS Coronavirus 2 by RT PCR NEGATIVE NEGATIVE    Comment: (NOTE) SARS-CoV-2 target nucleic acids are NOT DETECTED.  The SARS-CoV-2 RNA is  generally detectable in upper respiratory specimens during the acute phase of infection. The lowest concentration of SARS-CoV-2 viral copies this assay can detect is 138 copies/mL. A negative result does not preclude SARS-Cov-2 infection and should not be used as the sole basis for treatment or other patient management decisions. A negative result may occur with  improper specimen collection/handling, submission of specimen other than nasopharyngeal swab, presence of viral mutation(s) within the areas targeted by this assay, and inadequate number of viral copies(<138 copies/mL). A negative result must be combined with clinical observations, patient history, and epidemiological information. The expected result is Negative.  Fact Sheet for Patients:  BloggerCourse.com  Fact Sheet for Healthcare Providers:  SeriousBroker.it  This test is no t yet approved or cleared by the [place] FDA and  has been authorized for detection and/or diagnosis of SARS-CoV-2 by FDA under an Emergency Use Authorization (EUA). This EUA will remain  in effect (meaning this test can be used) for the duration of the COVID-19 declaration under Section 564(b)(1) of the Act, 21 U.S.C.section 360bbb-3(b)(1), unless the authorization is terminated  or revoked sooner.       Influenza A by PCR NEGATIVE NEGATIVE   Influenza B by PCR NEGATIVE NEGATIVE    Comment: (NOTE) The Xpert Xpress SARS-CoV-2/FLU/RSV plus assay is intended as an aid in the diagnosis of influenza from Nasopharyngeal swab specimens and should not be used as a sole basis for treatment. Nasal washings and aspirates are unacceptable for Xpert Xpress SARS-CoV-2/FLU/RSV testing.  Fact Sheet for Patients: BloggerCourse.com  Fact Sheet for Healthcare Providers: SeriousBroker.it  This test is not yet approved or cleared by the [place] FDA  and has been authorized for detection and/or diagnosis of SARS-CoV-2 by FDA under an Emergency Use Authorization (EUA). This EUA will remain in effect (meaning this test can be used) for the duration of the COVID-19 declaration under Section 564(b)(1) of the Act, 21 U.S.C. section 360bbb-3(b)(1), unless the authorization is terminated or revoked.     Resp Syncytial Virus by PCR NEGATIVE NEGATIVE    Comment: (NOTE) Fact Sheet for Patients: BloggerCourse.com  Fact Sheet for Healthcare Providers: SeriousBroker.it  This test is not yet approved or cleared by the [place] FDA and has been authorized for detection and/or diagnosis of SARS-CoV-2 by FDA under an Emergency Use Authorization (EUA). This EUA will remain in effect (meaning this test can be used) for the duration of the COVID-19 declaration under Section 564(b)(1) of the Act, 21 U.S.C. section 360bbb-3(b)(1), unless the authorization is terminated or revoked.  Performed at [facility], [address], [place], [place] [address]   Comprehensive metabolic panel     Status: Abnormal   Collection Time: 04/19/23  1:22 AM  Result Value Ref Range   Sodium 130 (L) 135 - 145 mmol/L   Potassium 3.2 (L) 3.5 - 5.1 mmol/L   Chloride 96 (L) 98 - 111  mmol/L   CO2 24 22 - 32 mmol/L   Glucose, Bld 129 (H) 70 - 99 mg/dL    Comment: Glucose reference range applies only to samples taken after fasting for at least 8 hours.   BUN 12 8 - 23 mg/dL   Creatinine, Ser 0.86 0.61 - 1.24 mg/dL   Calcium 57.8 8.9 - 46.9 mg/dL   Total Protein 7.6 6.5 - 8.1 g/dL   Albumin 4.2 3.5 - 5.0 g/dL   AST 24 15 - 41 U/L   ALT 17 0 - 44 U/L   Alkaline Phosphatase 68 38 - 126 U/L   Total Bilirubin 0.7 <1.2 mg/dL   GFR, Estimated >62 >95 mL/min    Comment: (NOTE) Calculated using the CKD-EPI Creatinine Equation (2021)    Anion gap 10 5 - 15    Comment: Performed at [facility], [address] [place], [place] [address]  CBC with Differential     Status: Abnormal   Collection Time: 04/19/23  1:22 AM  Result Value Ref Range   WBC 14.5 (H) 4.0 - 10.5 K/uL   RBC 4.50 4.22 - 5.81 MIL/uL   Hemoglobin 14.4 13.0 - 17.0 g/dL   HCT 24.4 01.0 - 27.2 %   MCV 91.6 80.0 - 100.0 fL   MCH 32.0 26.0 - 34.0 pg   MCHC 35.0 30.0 - 36.0 g/dL   RDW 53.6 64.4 - 03.4 %   Platelets 104 (L) 150 - 400 K/uL   nRBC 0.0 0.0 - 0.2 %   Neutrophils Relative % 80 %   Neutro Abs 11.6 (H) 1.7 - 7.7 K/uL   Band Neutrophils 1 %   Lymphocytes Relative 11 %   Lymphs Abs 1.5 0.7 - 4.0 K/uL   Monocytes Relative 8 %   Monocytes Absolute 1.2 (H) 0.1 - 1.0 K/uL   Eosinophils Relative 0 %   Eosinophils Absolute 0.0 0.0 - 0.5 K/uL   Basophils Relative 0 %   Basophils Absolute 0.0 0.0 - 0.1 K/uL   Smear Review Reviewed     Comment: Performed at [facility], [address] [place], [place] [address]  Magnesium     Status: None   Collection Time: 04/19/23  1:22 AM  Result Value Ref Range   Magnesium 2.0 1.7 - 2.4 mg/dL    Comment: Performed at [facility], [address], [place], [place] [address]  Phosphorus     Status: None   Collection Time: 04/19/23  1:22 AM  Result Value Ref Range   Phosphorus 2.6 2.5 - 4.6 mg/dL    Comment: Performed at [facility], [address] [place], [place] [address]   DG Chest Port 1 View  Result Date: 04/18/2023 CLINICAL DATA:  67 year old male with history of shortness of breath. EXAM: PORTABLE CHEST 1 VIEW COMPARISON:  Chest x-ray 04/16/2023. FINDINGS: Lung volumes are normal. Mild diffuse interstitial prominence and peribronchial cuffing. No consolidative airspace disease. No pleural effusions. No pneumothorax. No pulmonary nodule or mass noted. Pulmonary vasculature and the cardiomediastinal silhouette are within normal limits. Atherosclerosis in the thoracic aorta. IMPRESSION:  1. Mild diffuse interstitial prominence and peribronchial cuffing, which may suggest an acute bronchitis. 2. Aortic atherosclerosis. Electronically Signed   By: Trudie Reed M.D.   On: 04/18/2023 06:50    Pending Labs Unresulted Labs (From admission, onward)    None       Vitals/Pain Today's Vitals   04/19/23 0700 04/19/23 0818 04/19/23 1013  04/19/23 1015  BP: (!) 167/103  (!) 172/93   Pulse: 81  73   Resp: 16  16   Temp:    97.8 F (36.6 C)  TempSrc:    Oral  SpO2: 99% 98% 99%   PainSc:        Isolation Precautions No active isolations  Medications Medications  ipratropium-albuterol (DUONEB) 0.5-2.5 (3) MG/3ML nebulizer solution (  Not Given 04/19/23 0324)  acetaminophen (TYLENOL) tablet 650 mg (has no administration in time range)    Or  acetaminophen (TYLENOL) suppository 650 mg (has no administration in time range)  melatonin tablet 3 mg (has no administration in time range)  ondansetron (ZOFRAN) injection 4 mg (4 mg Intravenous Given 04/19/23 1207)  ipratropium-albuterol (DUONEB) 0.5-2.5 (3) MG/3ML nebulizer solution 3 mL (3 mLs Nebulization Given 04/19/23 0818)  albuterol (PROVENTIL) (2.5 MG/3ML) 0.083% nebulizer solution 2.5 mg (has no administration in time range)  hydrALAZINE (APRESOLINE) injection 10 mg (has no administration in time range)  predniSONE (DELTASONE) tablet 40 mg (has no administration in time range)  doxycycline (VIBRA-TABS) tablet 100 mg (has no administration in time range)  tamsulosin (FLOMAX) capsule 0.4 mg (has no administration in time range)  dextromethorphan-guaiFENesin (MUCINEX DM) 30-600 MG per 12 hr tablet 1 tablet (has no administration in time range)  losartan (COZAAR) tablet 100 mg (has no administration in time range)    And  hydrochlorothiazide (HYDRODIURIL) tablet 12.5 mg (has no administration in time range)  ipratropium-albuterol (DUONEB) 0.5-2.5 (3) MG/3ML nebulizer solution 9 mL (9 mLs Nebulization Given 04/19/23 0137)   methylPREDNISolone sodium succinate (SOLU-MEDROL) 125 mg/2 mL injection 125 mg (125 mg Intravenous Given 04/19/23 0152)  albuterol (PROVENTIL) (2.5 MG/3ML) 0.083% nebulizer solution (10 mg/hr Nebulization Given 04/19/23 0357)  potassium chloride SA (KLOR-CON M) CR tablet 40 mEq (40 mEq Oral Given 04/19/23 0626)  magnesium oxide (MAG-OX) tablet 800 mg (800 mg Oral Given 04/19/23 0627)  potassium chloride SA (KLOR-CON M) CR tablet 40 mEq (40 mEq Oral Given 04/19/23 1004)    Mobility walks with person assist     Focused Assessments Cardiac Assessment Handoff:    Lab Results  Component Value Date   CKTOTAL 159 05/06/2011   CKMB 5.0 (H) 05/06/2011   TROPONINI <0.30 07/19/2011   No results found for: "DDIMER" Does the Patient currently have chest pain? No    R Recommendations: See Admitting Provider Note  Report given to:   Additional Notes:

## 2023-04-20 DIAGNOSIS — E871 Hypo-osmolality and hyponatremia: Secondary | ICD-10-CM

## 2023-04-20 DIAGNOSIS — I10 Essential (primary) hypertension: Secondary | ICD-10-CM | POA: Diagnosis not present

## 2023-04-20 DIAGNOSIS — E876 Hypokalemia: Secondary | ICD-10-CM

## 2023-04-20 DIAGNOSIS — J441 Chronic obstructive pulmonary disease with (acute) exacerbation: Secondary | ICD-10-CM | POA: Diagnosis not present

## 2023-04-20 LAB — CBC
HCT: 44.6 % (ref 39.0–52.0)
Hemoglobin: 15.3 g/dL (ref 13.0–17.0)
MCH: 32.1 pg (ref 26.0–34.0)
MCHC: 34.3 g/dL (ref 30.0–36.0)
MCV: 93.5 fL (ref 80.0–100.0)
Platelets: 205 10*3/uL (ref 150–400)
RBC: 4.77 MIL/uL (ref 4.22–5.81)
RDW: 12.7 % (ref 11.5–15.5)
WBC: 16.8 10*3/uL — ABNORMAL HIGH (ref 4.0–10.5)
nRBC: 0 % (ref 0.0–0.2)

## 2023-04-20 LAB — RESPIRATORY PANEL BY PCR

## 2023-04-20 LAB — BASIC METABOLIC PANEL
Anion gap: 10 (ref 5–15)
BUN: 17 mg/dL (ref 8–23)
CO2: 27 mmol/L (ref 22–32)
Calcium: 10.5 mg/dL — ABNORMAL HIGH (ref 8.9–10.3)
Chloride: 97 mmol/L — ABNORMAL LOW (ref 98–111)
Creatinine, Ser: 0.89 mg/dL (ref 0.61–1.24)
GFR, Estimated: >60 mL/min (ref >60)
Glucose, Bld: 138 mg/dL — ABNORMAL HIGH (ref 70–99)
Potassium: 5.1 mmol/L (ref 3.5–5.1)
Sodium: 134 mmol/L — ABNORMAL LOW (ref 135–145)

## 2023-04-20 MED ORDER — PROCHLORPERAZINE EDISYLATE 10 MG/2ML IJ SOLN: 5.0000 mg | Freq: Four times a day (QID) | INTRAMUSCULAR | Status: DC | PRN

## 2023-04-20 MED ORDER — HYDROCOD POLI-CHLORPHE POLI ER 10-8 MG/5ML PO SUER
5.0000 mL | Freq: Two times a day (BID) | ORAL | Status: DC | PRN
  Administered 2023-04-20: 5 mL via ORAL
  Filled 2023-04-20: qty 5

## 2023-04-20 MED ORDER — LABETALOL HCL 200 MG PO TABS: 200.0000 mg | ORAL_TABLET | Freq: Once | ORAL | Status: DC

## 2023-04-20 MED ORDER — VARENICLINE TARTRATE 0.5 MG PO TABS: 0.5000 mg | ORAL_TABLET | Freq: Two times a day (BID) | ORAL | Status: DC

## 2023-04-20 MED ORDER — VARENICLINE TARTRATE 0.5 MG PO TABS
0.5000 mg | ORAL_TABLET | Freq: Every day | ORAL | Status: DC
  Administered 2023-04-21: 0.5 mg via ORAL
  Filled 2023-04-20: qty 1

## 2023-04-20 MED ORDER — DILTIAZEM HCL ER COATED BEADS 120 MG PO CP24
120.0000 mg | ORAL_CAPSULE | Freq: Every day | ORAL | Status: DC
  Administered 2023-04-20 – 2023-04-21 (×2): 120 mg via ORAL
  Filled 2023-04-20 (×2): qty 1

## 2023-04-20 MED ORDER — BENZONATATE 100 MG PO CAPS
100.0000 mg | ORAL_CAPSULE | Freq: Once | ORAL | Status: AC
  Administered 2023-04-20: 100 mg via ORAL
  Filled 2023-04-20: qty 1

## 2023-04-20 MED ORDER — VARENICLINE TARTRATE 1 MG PO TABS: 1.0000 mg | ORAL_TABLET | Freq: Two times a day (BID) | ORAL | Status: DC

## 2023-04-20 NOTE — Plan of Care (Signed)

## 2023-04-20 NOTE — Progress Notes (Signed)
SATURATION QUALIFICATIONS: (This note is used to comply with regulatory documentation for home oxygen)  Patient Saturations on Room Air at Rest = 93-94%  Patient Saturations on Room Air while Ambulating = 90-92%  Please briefly explain why patient needs home oxygen:  No needs.

## 2023-04-20 NOTE — Progress Notes (Signed)
  Progress Note   Patient: Don [name] MVH:[id number] DOB: [birth year] DOA: 04/19/2023     1 DOS: the patient was seen and examined on 04/20/2023 at 10:20AM      Brief hospital course: [name] with COPD not on home O2, HTN, dCHF who presentedw with few days wheezing, dyspnea, productive cough, found to have COPD.     Assessment and Plan: * Acute exacerbation of chronic obstructive pulmonary disease (COPD) (HCC) Due to Rhinovirus. Slowly improving but SpO2 90% on room air and very dysnpeic with ambulation today - Continue prednisone - Continue duonebs - Continue doxycycline - Smoking cessation  Essential hypertension BP elevated - Continue hydrochlorothiazide, losartan - Resume diltiazem  Thrombocytopenia (HCC) Resolved on repeat CBC, no bleeding  Hypokalemia Resolved  Hyponatremia Na improved to 134, asymptomatic  Tobacco abuse - Start Chantix - Continue nicotine          Subjective: Patient still with wheezing and coughing.  Very short of breath with ambulation.  Nursing ambulated him and he desaturated to 90%, which very short of breath.  Thick dark sputum.     Physical Exam: BP (!) 174/96 (BP Location: Right Arm)   Pulse 67   Temp 98.8 F (37.1 C) (Oral)   Resp 16   Ht 5\' 11"  (1.803 m)   Wt 80.6 kg   SpO2 96%   BMI 24.77 kg/m   Adult male, lying in bed, interactive and appropriate RRR, no murmurs, no peripheral edema Respiratory rate seems increased with exertion, wheezing bilaterally, severe Abdomen soft without tenderness palpation or guarding Attention normal, affect normal, judgment and insight appear normal  Data Reviewed: CBC shows white count stable at 16 Respiratory virus panel positive for rhinovirus Troponin and BNP normal Sodium improved to 134, potassium improved to 5.1, creatinine stable at baseline  Family Communication: None    Disposition: Status is: Inpatient         Author: Alberteen Sam,  MD 04/20/2023 3:48 PM  For on call review [email or web address].

## 2023-04-20 NOTE — Assessment & Plan Note (Signed)
Due to Rhinovirus. Slowly improving but SpO2 90% on room air and very dysnpeic with ambulation today - Continue prednisone - Continue duonebs - Continue doxycycline - Smoking cessation

## 2023-04-20 NOTE — Plan of Care (Signed)
  Problem: Education: Goal: Knowledge of General Education information will improve Description: Including pain rating scale, medication(s)/side effects and non-pharmacologic comfort measures Outcome: Progressing   Problem: Clinical Measurements: Goal: Ability to maintain clinical measurements within normal limits will improve Outcome: Progressing   Problem: Activity: Goal: Risk for activity intolerance will decrease Outcome: Progressing   Problem: Nutrition: Goal: Adequate nutrition will be maintained Outcome: Progressing   Problem: Skin Integrity: Goal: Risk for impaired skin integrity will decrease Outcome: Progressing   

## 2023-04-20 NOTE — Assessment & Plan Note (Addendum)
Resolved on repeat CBC, no bleeding

## 2023-04-20 NOTE — Assessment & Plan Note (Signed)
Resolved

## 2023-04-20 NOTE — Assessment & Plan Note (Signed)
BP elevated - Continue hydrochlorothiazide, losartan - Resume diltiazem

## 2023-04-20 NOTE — Assessment & Plan Note (Signed)
Na improved to 134, asymptomatic

## 2023-04-20 NOTE — Assessment & Plan Note (Signed)
-   Start Chantix - Continue nicotine

## 2023-04-20 NOTE — Hospital Course (Signed)
[name] with COPD not on home O2, HTN, dCHF who presentedw with few days wheezing, dyspnea, productive cough, found to have COPD.

## 2023-04-21 DIAGNOSIS — J441 Chronic obstructive pulmonary disease with (acute) exacerbation: Secondary | ICD-10-CM | POA: Diagnosis not present

## 2023-04-21 MED ORDER — DILTIAZEM HCL ER COATED BEADS 120 MG PO CP24: 120.0000 mg | ORAL_CAPSULE | Freq: Every day | ORAL | 11 refills | Status: AC

## 2023-04-21 MED ORDER — VARENICLINE TARTRATE (STARTER) 0.5 MG X 11 & 1 MG X 42 PO TBPK: ORAL_TABLET | ORAL | 0 refills | Status: AC

## 2023-04-21 MED ORDER — ALBUTEROL SULFATE HFA 108 (90 BASE) MCG/ACT IN AERS: 1.0000 | INHALATION_SPRAY | RESPIRATORY_TRACT | 0 refills | Status: AC | PRN

## 2023-04-21 MED ORDER — PREDNISONE 10 MG PO TABS: ORAL_TABLET | ORAL | 0 refills | Status: AC

## 2023-04-21 MED ORDER — DOXYCYCLINE HYCLATE 100 MG PO CAPS: 100.0000 mg | ORAL_CAPSULE | Freq: Two times a day (BID) | ORAL | 0 refills | Status: DC

## 2023-04-21 NOTE — Plan of Care (Signed)

## 2023-04-21 NOTE — TOC Progression Note (Signed)
Transition of Care [facility]) - Progression Note    Patient Details  Name: Don [name] MRN: [id number] Date of Birth: 1955-10-29  [facility] CM/SW Contact  Geni Bers, RN Phone Number: 04/21/2023, 11:35 AM  Clinical Narrative:    Spoke with pt concerning medications and food related to him stating that he could not afford the cost. Pt states that everything has gone up, he has car payment, rent, gas and food has gone up. Encouraged pt to call [facility] about medications and food. Pt may benefit with using [employer] pharmacy in the future.  Pt states that his [employer] plan will change in Jan. 2025. Explained to pt that he could go to churches that give out food once a week, [employer] near  his home, [employer] and [employer]. Pt states that he will make it. That he did not like asking for help. Pt states that he will call [employer]. TOC signing off.         Expected Discharge Plan and Services         Expected Discharge Date: 04/21/23                                     Social Determinants of Health (SDOH) Interventions SDOH Screenings   Food Insecurity: Food Insecurity Present (04/19/2023)  Housing: Low Risk  (04/19/2023)  Transportation Needs: No Transportation Needs (04/19/2023)  Utilities: Not At Risk (04/19/2023)  Alcohol Screen: Low Risk  (09/26/2022)  Depression (PHQ2-9): Medium Risk (03/14/2023)  Financial Resource Strain: Low Risk  (09/26/2022)  Physical Activity: Inactive (09/26/2022)  Social Connections: Moderately Integrated (09/26/2022)  Stress: No Stress Concern Present (09/26/2022)  Tobacco Use: High Risk (04/19/2023)    Readmission Risk Interventions     No data to display

## 2023-04-21 NOTE — Discharge Summary (Signed)
Physician Discharge Summary   Patient: Don [name] MRN: [id number] DOB: 30-Jun-1955  Admit date:     04/19/2023  Discharge date: 04/21/23  Discharge Physician: Don [name]   PCP: Don Apley, NP     Recommendations at discharge:  Follow up with PCP Don [name] in 1 week for COPD flare due to Rhinovirus Follow up new Chantix for smoking cessation Update PFTs if appropriate     Discharge Diagnoses: Principal Problem:   Acute exacerbation of chronic obstructive pulmonary disease (COPD) (HCC) Active Problems:   Tobacco abuse   Hyponatremia   Hypokalemia   Thrombocytopenia (HCC)   Essential hypertension      Hospital Course: [name] with COPD not on home O2, HTN, dCHF who presentedw with few days wheezing, dyspnea, productive cough, found to have COPD flare, positive Rhinovirus PCR.      * Acute exacerbation of chronic obstructive pulmonary disease (COPD) (HCC) Due to Rhinovirus.   Weaned off O2, wheezing improved.   Discharged to complete 8 day prednisone taper, doxycycline.  Refills of albuterol given.    Smoking Advice given.  Chantix started.  Thrombocytopenia (HCC) Resolved.             The [facility] Controlled Substances Registry was reviewed for this patient prior to discharge.  Consultants: None Procedures performed: None  Disposition: Home Diet recommendation:  Discharge Diet Orders (From admission, onward)     Start     Ordered   04/21/23 0000  Diet - low sodium heart healthy        04/21/23 1140             DISCHARGE MEDICATION: Allergies as of 04/21/2023   No Known Allergies      Medication List     STOP taking these medications    ibuprofen 200 MG tablet Commonly known as: ADVIL       TAKE these medications    acetaminophen 500 MG tablet Commonly known as: TYLENOL Take 1 tablet (500 mg total) by mouth every 6 (six) hours as needed.   albuterol 108 (90 Base) MCG/ACT  inhaler Commonly known as: VENTOLIN HFA Inhale 1-2 puffs into the lungs every 4 (four) hours as needed for wheezing or shortness of breath.   aspirin 81 MG chewable tablet Chew 1 tablet (81 mg total) by mouth daily.   benzonatate 100 MG capsule Commonly known as: TESSALON Take 1 capsule (100 mg total) by mouth 2 (two) times daily as needed for cough.   diltiazem 120 MG 24 hr capsule Commonly known as: Cardizem CD Take 1 capsule (120 mg total) by mouth daily. What changed:  how much to take when to take this   diphenhydrAMINE 25 MG tablet Commonly known as: BENADRYL Take 25 mg by mouth daily as needed for allergies or itching.   doxycycline 100 MG capsule Commonly known as: VIBRAMYCIN Take 1 capsule (100 mg total) by mouth 2 (two) times daily.   HYDROcodone bit-homatropine 5-1.5 MG/5ML syrup Commonly known as: Hycodan Take 5 mLs by mouth every 6 (six) hours as needed for cough.   losartan-hydrochlorothiazide 100-12.5 MG tablet Commonly known as: HYZAAR Take 1 tablet by mouth daily.   multivitamin with minerals tablet Take 1 tablet by mouth daily.   predniSONE 10 MG tablet Commonly known as: DELTASONE 40 mg x 3 days, 20 mg x 3 days, 10 mg x 3 days What changed: Another medication with the same name was added. Make sure you understand how and  when to take each.   predniSONE 10 MG tablet Commonly known as: DELTASONE Take daily as directed according to taper. What changed: You were already taking a medication with the same name, and this prescription was added. Make sure you understand how and when to take each.   tamsulosin 0.4 MG Caps capsule Commonly known as: FLOMAX Take 1 capsule (0.4 mg total) by mouth daily.   Varenicline Tartrate (Starter) 0.5 MG X 11 & 1 MG X 42 Tbpk Commonly known as: Chantix Starting [employer] Take as directed on package        Follow-up Information     Don Apley, NP. Schedule an appointment as soon as possible for a visit  in 1 week(s).   Specialty: Family Medicine Contact information: [address] [place] [address] [phone]                 Discharge Instructions     Diet - low sodium heart healthy   Complete by: As directed    Discharge instructions   Complete by: As directed    **IMPORTANT DISCHARGE INSTRUCTIONS**   From Don [name]: You were admitted for a COPD flare  You should take the prednisone as prescribed to you previously: Take prednisone 40 mg (4 tabs) daily for 3 days then  Take prednisone 20 mg (2 tabs) daily for 3 days then  Take prednisone 10 mg (1 tab) daily for 3 days then stop  If you don't have 21 tablets left at home, let your nurse know  I refilled your albuterol Fill this and take three times daily for the next week  After 1 week, reduce to as needed use  Take the doxycycline 100 mg twice daily for 5 more days If you don't have 10 doxycycline tablets at home left, let your nurse know  For cough, this should linger for a few weeks, but gradually be getting better If you have cough, take the tessalon perles or Hycodan that you were already prescribed Do not drive or drink alcohol with hycodan   For smoking cessation, take varenicline/Chantix  It comes in a starter pack (you gradually increase the medication over a week) Take as directed on the starter pack   Resume your other home medicines   Increase activity slowly   Complete by: As directed        Discharge Exam: Filed Weights   04/19/23 1347 04/21/23 0500  Weight: 80.6 kg 81 kg    General: Pt is alert, awake, not in acute distress Cardiovascular: RRR, nl S1-S2, no murmurs appreciated.   No LE edema.   Respiratory: Normal respiratory rate and rhythm.  CTAB without rales or wheezes. Abdominal: Abdomen soft and non-tender.  No distension or HSM.   Neuro/Psych: Strength symmetric in upper and lower extremities.  Judgment and insight appear normal.   Condition at discharge:  good  The results of significant diagnostics from this hospitalization (including imaging, microbiology, ancillary and laboratory) are listed below for reference.   Imaging Studies: DG Chest Port 1 View  Result Date: 04/18/2023 CLINICAL DATA:  67 year old male with history of shortness of breath. EXAM: PORTABLE CHEST 1 VIEW COMPARISON:  Chest x-ray 04/16/2023. FINDINGS: Lung volumes are normal. Mild diffuse interstitial prominence and peribronchial cuffing. No consolidative airspace disease. No pleural effusions. No pneumothorax. No pulmonary nodule or mass noted. Pulmonary vasculature and the cardiomediastinal silhouette are within normal limits. Atherosclerosis in the thoracic aorta. IMPRESSION: 1. Mild diffuse interstitial prominence and peribronchial  cuffing, which may suggest an acute bronchitis. 2. Aortic atherosclerosis. Electronically Signed   By: Trudie Reed M.D.   On: 04/18/2023 06:50    Microbiology: Results for orders placed or performed during the hospital encounter of 04/19/23  Resp panel by RT-PCR (RSV, Flu A&B, Covid) Anterior Nasal Swab     Status: None   Collection Time: 04/19/23  1:13 AM   Specimen: Anterior Nasal Swab  Result Value Ref Range Status   SARS Coronavirus 2 by RT PCR NEGATIVE NEGATIVE Final    Comment: (NOTE) SARS-CoV-2 target nucleic acids are NOT DETECTED.  The SARS-CoV-2 RNA is generally detectable in upper respiratory specimens during the acute phase of infection. The lowest concentration of SARS-CoV-2 viral copies this assay can detect is 138 copies/mL. A negative result does not preclude SARS-Cov-2 infection and should not be used as the sole basis for treatment or other patient management decisions. A negative result may occur with  improper specimen collection/handling, submission of specimen other than nasopharyngeal swab, presence of viral mutation(s) within the areas targeted by this assay, and inadequate number of viral copies(<138  copies/mL). A negative result must be combined with clinical observations, patient history, and epidemiological information. The expected result is Negative.  Fact Sheet for Patients:  BloggerCourse.com  Fact Sheet for Healthcare Providers:  SeriousBroker.it  This test is no t yet approved or cleared by the [place] FDA and  has been authorized for detection and/or diagnosis of SARS-CoV-2 by FDA under an Emergency Use Authorization (EUA). This EUA will remain  in effect (meaning this test can be used) for the duration of the COVID-19 declaration under Section 564(b)(1) of the Act, 21 U.S.C.section 360bbb-3(b)(1), unless the authorization is terminated  or revoked sooner.       Influenza A by PCR NEGATIVE NEGATIVE Final   Influenza B by PCR NEGATIVE NEGATIVE Final    Comment: (NOTE) The Xpert Xpress SARS-CoV-2/FLU/RSV plus assay is intended as an aid in the diagnosis of influenza from Nasopharyngeal swab specimens and should not be used as a sole basis for treatment. Nasal washings and aspirates are unacceptable for Xpert Xpress SARS-CoV-2/FLU/RSV testing.  Fact Sheet for Patients: BloggerCourse.com  Fact Sheet for Healthcare Providers: SeriousBroker.it  This test is not yet approved or cleared by the [place] FDA and has been authorized for detection and/or diagnosis of SARS-CoV-2 by FDA under an Emergency Use Authorization (EUA). This EUA will remain in effect (meaning this test can be used) for the duration of the COVID-19 declaration under Section 564(b)(1) of the Act, 21 U.S.C. section 360bbb-3(b)(1), unless the authorization is terminated or revoked.     Resp Syncytial Virus by PCR NEGATIVE NEGATIVE Final    Comment: (NOTE) Fact Sheet for Patients: BloggerCourse.com  Fact Sheet for Healthcare  Providers: SeriousBroker.it  This test is not yet approved or cleared by the [place] FDA and has been authorized for detection and/or diagnosis of SARS-CoV-2 by FDA under an Emergency Use Authorization (EUA). This EUA will remain in effect (meaning this test can be used) for the duration of the COVID-19 declaration under Section 564(b)(1) of the Act, 21 U.S.C. section 360bbb-3(b)(1), unless the authorization is terminated or revoked.  Performed at [facility], [address] [place], [place] [address]   Respiratory (~20 pathogens) panel by PCR     Status: Abnormal   Collection Time: 04/20/23  8:08 AM   Specimen: Nasopharyngeal Swab; Respiratory  Result Value Ref Range Status   Adenovirus NOT DETECTED NOT  DETECTED Final   Coronavirus 229E NOT DETECTED NOT DETECTED Final    Comment: (NOTE) The Coronavirus on the Respiratory Panel, DOES NOT test for the novel  Coronavirus (2019 nCoV)    Coronavirus HKU1 NOT DETECTED NOT DETECTED Final   Coronavirus NL63 NOT DETECTED NOT DETECTED Final   Coronavirus OC43 NOT DETECTED NOT DETECTED Final   Metapneumovirus NOT DETECTED NOT DETECTED Final   Rhinovirus / Enterovirus DETECTED (A) NOT DETECTED Final   Influenza A NOT DETECTED NOT DETECTED Final   Influenza B NOT DETECTED NOT DETECTED Final   Parainfluenza Virus 1 NOT DETECTED NOT DETECTED Final   Parainfluenza Virus 2 NOT DETECTED NOT DETECTED Final   Parainfluenza Virus 3 NOT DETECTED NOT DETECTED Final   Parainfluenza Virus 4 NOT DETECTED NOT DETECTED Final   Respiratory Syncytial Virus NOT DETECTED NOT DETECTED Final   Bordetella pertussis NOT DETECTED NOT DETECTED Final   Bordetella Parapertussis NOT DETECTED NOT DETECTED Final   Chlamydophila pneumoniae NOT DETECTED NOT DETECTED Final   Mycoplasma pneumoniae NOT DETECTED NOT DETECTED Final    Comment: Performed at [facility], [address] [place], [place] [address]     Labs: CBC: Recent Labs  Lab 04/18/23 6045 04/19/23 0122 04/20/23 1027  WBC 15.9* 14.5* 16.8*  NEUTROABS 12.5* 11.6*  --   HGB 14.7 14.4 15.3  HCT 42.1 41.2 44.6  MCV 91.3 91.6 93.5  PLT 245 104* 205   Basic Metabolic Panel: Recent Labs  Lab 04/18/23 0651 04/19/23 0122 04/20/23 1027  NA 138 130* 134*  K 3.3* 3.2* 5.1  CL 101 96* 97*  CO2 26 24 27   GLUCOSE 146* 129* 138*  BUN 12 12 17   CREATININE 0.78 0.80 0.89  CALCIUM 10.6* 10.2 10.5*  MG  --  2.0  --   PHOS  --  2.6  --    Liver Function Tests: Recent Labs  Lab 04/19/23 0122  AST 24  ALT 17  ALKPHOS 68  BILITOT 0.7  PROT 7.6  ALBUMIN 4.2   CBG: No results for input(s): "GLUCAP" in the last 168 hours.  Discharge time spent: approximately 35 minutes spent on discharge counseling, evaluation of patient on day of discharge, and coordination of discharge planning with nursing, social work, pharmacy and case management  Signed: Alberteen Sam, MD [facility] 04/21/2023

## 2023-04-21 NOTE — Progress Notes (Signed)
RT instructed pt on how to use inhaler at bedside with spacer.  Pt had appropriate understanding and technique of the Ventolin inhaler with spacer provider.

## 2023-04-22 ENCOUNTER — Telehealth: Payer: Self-pay

## 2023-04-22 NOTE — Transitions of Care (Post Inpatient/ED Visit) (Signed)
04/22/2023  Name: Don [name] MRN: [id number] DOB: 10-18-1955  Today's TOC FU Call Status:   Patient's Name and Date of Birth confirmed.  Transition Care Management Follow-up Telephone Call Date of Discharge: 04/21/23 Discharge Facility: [facility]) Type of Discharge: Inpatient Admission Primary Inpatient Discharge Diagnosis:: Rhinovirus, COPD How have you been since you were released from the hospital?: Better Any questions or concerns?: No  Items Reviewed: Did you receive and understand the discharge instructions provided?: Yes Medications obtained,verified, and reconciled?: Yes (Medications Reviewed) Any new allergies since your discharge?: No Dietary orders reviewed?: No Do you have support at home?: Yes People in Home: sibling(s) Name of Support/Comfort Primary Source: Nettie Elm- sister  Medications Reviewed Today: Medications Reviewed Today     Reviewed by Jodelle Gross, RN (Case Manager) on 04/22/23 at 1312  Med List Status: <None>   Medication Order Taking? Sig Documenting Provider Last Dose Status Informant  acetaminophen (TYLENOL) 500 MG tablet [id number] Yes Take 1 tablet (500 mg total) by mouth every 6 (six) hours as needed. Scarlette Ar, MD Taking Active Self, Pharmacy Records  albuterol (VENTOLIN HFA) 108 (90 Base) MCG/ACT inhaler [id number] Yes Inhale 1-2 puffs into the lungs every 4 (four) hours as needed for wheezing or shortness of breath. Alberteen Sam, MD Taking Active   aspirin 81 MG chewable tablet [id number] No Chew 1 tablet (81 mg total) by mouth daily.  Patient not taking: Reported on 04/19/2023   Standley Brooking, MD Not Taking Active Self, Pharmacy Records           Med Note Sherrie Mustache, [place] [facility]   Tue Oct 01, 2022 10:53 AM)    benzonatate (TESSALON) 100 MG capsule [id number] No Take 1 capsule (100 mg total) by mouth 2 (two) times daily as needed for cough.  Patient not taking: Reported on 04/19/2023   Alveria Apley, NP Not  Taking Active Self, Pharmacy Records           Med Note Electa Sniff, [facility]   Tue Apr 22, 2023  1:08 PM) Patient has not picked up from pharmacy  diltiazem (CARDIZEM CD) 120 MG 24 hr capsule [id number] Yes Take 1 capsule (120 mg total) by mouth daily. Danford, Earl Lites, MD Taking Active   diphenhydrAMINE (BENADRYL) 25 MG tablet [id number] Yes Take 25 mg by mouth daily as needed for allergies or itching. [provider] Taking Active Self, Pharmacy Records  doxycycline (VIBRAMYCIN) 100 MG capsule [id number] Yes Take 1 capsule (100 mg total) by mouth 2 (two) times daily. Alberteen Sam, MD Taking Active   HYDROcodone bit-homatropine (HYCODAN) 5-1.5 MG/5ML syrup [id number] No Take 5 mLs by mouth every 6 (six) hours as needed for cough.  Patient not taking: Reported on 04/19/2023   Gwyneth Sprout, MD Not Taking Active Self, Pharmacy Records           Med Note Electa Sniff, [facility]   Tue Apr 22, 2023  1:11 PM) Patient needs to pick up at pharmacy  losartan-hydrochlorothiazide [facility]) 100-12.5 MG tablet [id number] Yes Take 1 tablet by mouth daily. Alveria Apley, NP Taking Active Self, Pharmacy Records  Multiple Vitamins-Minerals (MULTIVITAMIN WITH MINERALS) tablet [id number] No Take 1 tablet by mouth daily.  Patient not taking: Reported on 04/19/2023   [provider] Not Taking Active Self, Pharmacy Records  predniSONE (DELTASONE) 10 MG tablet [id number] Yes 40 mg x 3 days, 20 mg x 3 days, 10 mg x 3 days Shirline Frees, NP Taking Active Self, Pharmacy Records  predniSONE (  DELTASONE) 10 MG tablet [id number]  Take daily as directed according to taper. Alberteen Sam, MD  Active   tamsulosin [facility]) 0.4 MG CAPS capsule [id number] Yes Take 1 capsule (0.4 mg total) by mouth daily. Alveria Apley, NP Taking Active Self, Pharmacy Records  Varenicline Tartrate, Starter, (CHANTIX STARTING MONTH PAK) 0.5 MG X 11 & 1 MG X 42 TBPK [id number] No Take as directed on package   Patient not taking: Reported on 04/22/2023   Alberteen Sam, MD Not Taking Active            Med Note Electa Sniff [facility]   Tue Apr 22, 2023  1:12 PM) Patient has not picked up at pharmacy            Home Care and Equipment/Supplies: Were Home Health Services Ordered?: No Any new equipment or medical supplies ordered?: No  Functional Questionnaire: Do you need assistance with bathing/showering or dressing?: No Do you need assistance with meal preparation?: No Do you need assistance with eating?: No Do you have difficulty maintaining continence: No Do you need assistance with getting out of bed/getting out of a chair/moving?: No Do you have difficulty managing or taking your medications?: No  Follow up appointments reviewed: PCP Follow-up appointment confirmed?: Yes Date of PCP follow-up appointment?: 04/25/23 (Scheduled by Burman Nieves, Care Guide) Follow-up Provider: Shirline Frees, NP Specialist Hospital Follow-up appointment confirmed?: NA Do you need transportation to your follow-up appointment?: No Do you understand care options if your condition(s) worsen?: Yes-patient verbalized understanding  SDOH Interventions Today    Flowsheet Row Most Recent Value  SDOH Interventions   Food Insecurity Interventions Other (Comment)  [Provided information on food pantries]  Housing Interventions Intervention Not Indicated  Transportation Interventions Intervention Not Indicated  Financial Strain Interventions Other (Comment)  [Discussed with patient and he shared he is in the process of getting a loan to assist with his medical costs.  He does not like to ask for help.]  Stress Interventions Other (Comment)  [Medical cost stress-medications, copays]       Goals Addressed             This Visit's Progress    30 day TOC Program        Current Barriers:  Knowledge Deficits related to plan of care for management of COPD   RNCM Clinical Goal(s):  Patient will work  with the Care Management team over the next 30 days to address Transition of Care Barriers: Medication access Diet/Nutrition/Food Resources Provider appointments through collaboration with RN Care manager, provider, and care team.   Interventions: Evaluation of current treatment plan related to  self management and patient's adherence to plan as established by provider  Transitions of Care:  New goal. and Goal on track:  Yes. Community Resource Referral Made to address Food Insecurity Tobacco Use Doctor Visits  - discussed the importance of doctor visits  Patient Goals/Self-Care Activities: Participate in Transition of Care Program/Attend [facility] scheduled calls Notify RN Care Manager of TOC call rescheduling needs Take all medications as prescribed Attend all scheduled provider appointments Call pharmacy for medication refills 3-7 days in advance of running out of medications Call provider office for new concerns or questions   Follow Up Plan:  Telephone follow up appointment with care management team member scheduled for:  04/29/23@1PM .          Jodelle Gross RN, BSN, CCM RN Care Manager  Transitions of Care  [facility]  [phone]

## 2023-04-25 ENCOUNTER — Encounter: Payer: Self-pay | Admitting: Adult Health

## 2023-04-25 ENCOUNTER — Ambulatory Visit: Payer: Medicare PPO | Admitting: Adult Health

## 2023-04-25 VITALS — BP 120/82 | HR 55 | Temp 97.7°F | Ht 71.0 in | Wt 183.0 lb

## 2023-04-25 DIAGNOSIS — D696 Thrombocytopenia, unspecified: Secondary | ICD-10-CM | POA: Diagnosis not present

## 2023-04-25 DIAGNOSIS — F17200 Nicotine dependence, unspecified, uncomplicated: Secondary | ICD-10-CM | POA: Diagnosis not present

## 2023-04-25 DIAGNOSIS — J441 Chronic obstructive pulmonary disease with (acute) exacerbation: Secondary | ICD-10-CM

## 2023-04-25 LAB — BASIC METABOLIC PANEL
BUN: 22 mg/dL (ref 6–23)
CO2: 32 meq/L (ref 19–32)
Calcium: 10.5 mg/dL (ref 8.4–10.5)
Chloride: 96 meq/L (ref 96–112)
Creatinine, Ser: 0.9 mg/dL (ref 0.40–1.50)
GFR: 87.23 mL/min (ref >60.00)
Glucose, Bld: 98 mg/dL (ref 70–99)
Potassium: 4.1 meq/L (ref 3.5–5.1)
Sodium: 133 meq/L — ABNORMAL LOW (ref 135–145)

## 2023-04-25 LAB — CBC
HCT: 43.2 % (ref 39.0–52.0)
Hemoglobin: 14.5 g/dL (ref 13.0–17.0)
MCHC: 33.6 g/dL (ref 30.0–36.0)
MCV: 94 fL (ref 78.0–100.0)
Platelets: 281 10*3/uL (ref 150.0–400.0)
RBC: 4.59 Mil/uL (ref 4.22–5.81)
RDW: 13.4 % (ref 11.5–15.5)
WBC: 16.5 10*3/uL — ABNORMAL HIGH (ref 4.0–10.5)

## 2023-04-25 NOTE — Progress Notes (Signed)
Subjective:    Patient ID: Don [name], male    DOB: 02/06/1956, 67 y.o.   MRN: [id number]  HPI 67 year old male who  has a past medical history of Arthritis, Cancer (HCC), Chronic pain, COPD (chronic obstructive pulmonary disease) (HCC), Depression, Hypertension, Renal infarct (HCC) (06/10/2022), and Skin cancer.  He presents to the office today for TCM visit.   Admit Date 04/19/2023 Discharge Date 04/21/2023  He presented to the emergency room with wheezing, dyspnea, productive cough for a few days prior.  He was seen by myself on 04/16/2023 for productive cough wheezing, shortness of breath, body aches.  He was treated for suspected pneumonia with doxycycline and prednisone.  X-ray was done but we still do not have the results of this x-ray from the office visit.  All in the emergency room his coronavirus, influenza, and RSV PCR were negative. Rhinovirus positive.   CBC showed a white count of 14.5, hemoglobin 14.4, platelets 104.  CMP showed a sodium of 130, potassium 3.2, chloride 96.  Chest x-ray showed mild diffuse interstitial prominence and peribronchial cuffing suggesting acute bronchitis.  Hospital Course  Exacerbation of chronic obstructive pulmonary disease -To rhinovirus.  He was able to be weaned off O2 and wheezing improved.  He was discharged to complete an 8-day prednisone taper and doxycycline.  Smoking - was started on Chantix  Thrombocytopenia - Resolved upon discharge.   Today he reports that " I am better than I have in a long time". He is taking his medications as directed but has not started Chantix yet. He has not had any fevers or chills.   Review of Systems  Constitutional: Negative.   HENT: Negative.    Eyes: Negative.   Respiratory: Negative.    Cardiovascular: Negative.   Gastrointestinal: Negative.   Endocrine: Negative.   Genitourinary: Negative.   Musculoskeletal: Negative.   Skin: Negative.   Allergic/Immunologic: Negative.    Neurological: Negative.   Hematological: Negative.   Psychiatric/Behavioral: Negative.    All other systems reviewed and are negative.  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   Skin cancer     Social History   Socioeconomic History   Marital status: Significant Other    Spouse name: Not on file   Number of children: 2   Years of education: Not on file   Highest education level: Some college, no degree  Occupational History   Occupation: Retired  Tobacco Use   Smoking status: Every Day    Current packs/day: 0.50    Average packs/day: 0.5 packs/day for 30.0 years (15.0 ttl pk-yrs)    Types: Cigarettes    Passive exposure: Current   Smokeless tobacco: Never  Vaping Use   Vaping status: Never Used  Substance and Sexual Activity   Alcohol use: No   Drug use: No   Sexual activity: Yes  Other Topics Concern   Not on file  Social History Narrative   Patient lives girlfriend and another gentleman. No pets.    Social Determinants of Health   Financial Resource Strain: Medium  Risk (04/22/2023)   Overall Financial Resource Strain (CARDIA)    Difficulty of Paying Living Expenses: Somewhat hard  Food Insecurity: Food Insecurity Present (04/22/2023)   Hunger Vital Sign    Worried About Running Out of Food in the Last Year: Sometimes true    Ran Out of Food in the Last Year: Sometimes true  Transportation Needs: No Transportation Needs (04/22/2023)   PRAPARE - Administrator, Civil Service (Medical): No    Lack of Transportation (Non-Medical): No  Physical Activity: Inactive (09/26/2022)   Exercise Vital  Sign    Days of Exercise per Week: 0 days    Minutes of Exercise per Session: 0 min  Stress: Stress Concern Present (04/22/2023)   [employer] of Occupational Health - Occupational Stress Questionnaire    Feeling of Stress : To some extent  Social Connections: Moderately Integrated (09/26/2022)   Social Connection and Isolation Panel [NHANES]    Frequency of Communication with Friends and Family: Twice a week    Frequency of Social Gatherings with Friends and Family: Twice a week    Attends Religious Services: More than 4 times per year    Active Member of [employer] or Organizations: No    Attends Banker Meetings: Never    Marital Status: Living with partner  Intimate Partner Violence: Not At Risk (04/22/2023)   Humiliation, Afraid, Rape, and Kick questionnaire    Fear of Current or Ex-Partner: No    Emotionally Abused: No    Physically Abused: No    Sexually Abused: No    Past Surgical History:  Procedure Laterality Date   cartilage removed from both knees     EXCISION MASS HEAD Right 08/28/2022   Procedure: EXCISION OF EAR WOUND;  Surgeon: Scarlette Ar, MD;  Location: [facility] OR;  Service: ENT;  Laterality: Right;   EXCISION MASS HEAD N/A 10/03/2022   Procedure: EXCISION WOUNDHEAD LESS THAN 100 CM^2 FOR FLAP;  Surgeon: Scarlette Ar, MD;  Location: [facility] OR;  Service: ENT;  Laterality: N/A;   KNEE ARTHROSCOPY     left wrist     SKIN CANCER EXCISION     SKIN FULL THICKNESS GRAFT Right 08/28/2022   Procedure: RIGHT EXCISION OF EAR WOUND, ADJACENT TISSUE TRANSFER, RIGHT SKIN GRAFT FROM NECK TO EAR;  Surgeon: Scarlette Ar, MD;  Location: [facility] OR;  Service: ENT;  Laterality: Right;   SKIN FULL THICKNESS GRAFT N/A 10/03/2022   Procedure: ADJACENT TISSUE TRANSFER LEFT CHEEK AND RIGHT EAR, FULL THICKNESS SKIN GRAFT LEFT NECK TO LEFT LOWER EYELID AND RIGHT EAR;  Surgeon: Scarlette Ar, MD;  Location: [facility] OR;  Service: ENT;  Laterality: N/A;    Family History  Problem Relation Age  of Onset   Cancer Father        Skin   Diabetes Father    Hypertension Father     No Known Allergies  Current Outpatient Medications on File Prior to Visit  Medication Sig Dispense Refill   acetaminophen (TYLENOL) 500 MG tablet Take 1 tablet (500 mg total) by mouth every 6 (six) hours as needed. 30 tablet 0   albuterol (VENTOLIN HFA) 108 (90 Base) MCG/ACT inhaler Inhale 1-2 puffs into the lungs every 4 (four) hours as needed for wheezing or shortness of breath. 1 each 0   aspirin 81 MG chewable tablet Chew 1 tablet (81 mg total) by mouth daily.     benzonatate (TESSALON) 100 MG capsule Take 1 capsule (100 mg total) by mouth 2 (two)  times daily as needed for cough. 20 capsule 0   diltiazem (CARDIZEM CD) 120 MG 24 hr capsule Take 1 capsule (120 mg total) by mouth daily. 30 capsule 11   diphenhydrAMINE (BENADRYL) 25 MG tablet Take 25 mg by mouth daily as needed for allergies or itching.     doxycycline (VIBRAMYCIN) 100 MG capsule Take 1 capsule (100 mg total) by mouth 2 (two) times daily. 10 capsule 0   HYDROcodone bit-homatropine (HYCODAN) 5-1.5 MG/5ML syrup Take 5 mLs by mouth every 6 (six) hours as needed for cough. 120 mL 0   losartan-hydrochlorothiazide (HYZAAR) 100-12.5 MG tablet Take 1 tablet by mouth daily. 30 tablet 0   Multiple Vitamins-Minerals (MULTIVITAMIN WITH MINERALS) tablet Take 1 tablet by mouth daily.     predniSONE (DELTASONE) 10 MG tablet 40 mg x 3 days, 20 mg x 3 days, 10 mg x 3 days 21 tablet 0   predniSONE (DELTASONE) 10 MG tablet Take daily as directed according to taper. 15 tablet 0   tamsulosin (FLOMAX) 0.4 MG CAPS capsule Take 1 capsule (0.4 mg total) by mouth daily. 30 capsule 3   Varenicline Tartrate, Starter, (CHANTIX STARTING MONTH PAK) 0.5 MG X 11 & 1 MG X 42 TBPK Take as directed on package 1 each 0   No current facility-administered medications on file prior to visit.    BP 120/82   Pulse (!) 55   Temp 97.7 F (36.5 C) (Oral)   Ht 5\' 11"  (1.803 m)    Wt 183 lb (83 kg)   SpO2 97%   BMI 25.52 kg/m       Objective:   Physical Exam Vitals and nursing note reviewed.  Constitutional:      General: He is not in acute distress.    Appearance: Normal appearance. He is not ill-appearing.  HENT:     Head: Normocephalic and atraumatic.     Right Ear: Tympanic membrane, ear canal and external ear normal. There is no impacted cerumen.     Left Ear: Tympanic membrane, ear canal and external ear normal. There is no impacted cerumen.     Nose: Nose normal. No congestion or rhinorrhea.     Mouth/Throat:     Mouth: Mucous membranes are moist.     Pharynx: Oropharynx is clear.  Eyes:     Extraocular Movements: Extraocular movements intact.     Conjunctiva/sclera: Conjunctivae normal.     Pupils: Pupils are equal, round, and reactive to light.  Neck:     Vascular: No carotid bruit.  Cardiovascular:     Rate and Rhythm: Normal rate and regular rhythm.     Pulses: Normal pulses.     Heart sounds: No murmur heard.    No friction rub. No gallop.  Pulmonary:     Effort: Pulmonary effort is normal.     Breath sounds: Normal breath sounds.  Abdominal:     General: Abdomen is flat. Bowel sounds are normal. There is no distension.     Palpations: Abdomen is soft. There is no mass.     Tenderness: There is no abdominal tenderness. There is no guarding or rebound.     Hernia: No hernia is present.  Musculoskeletal:        General: Normal range of motion.     Cervical back: Normal range of motion and neck supple.  Lymphadenopathy:     Cervical: No cervical adenopathy.  Skin:    General: Skin is warm and dry.     Capillary  Refill: Capillary refill takes less than 2 seconds.  Neurological:     General: No focal deficit present.     Mental Status: He is alert and oriented to person, place, and time.  Psychiatric:        Mood and Affect: Mood normal.        Behavior: Behavior normal.        Thought Content: Thought content normal.         Judgment: Judgment normal.       Assessment & Plan:  1. Acute exacerbation of chronic obstructive pulmonary disease (COPD) (HCC) - Reviewed hospital notes, labs, imaging, discharge notes and medications with the patient. All questions answered.  - He is feeling better. Encouraged to finish medication therapy  - Follow up with PCP as needed - Basic Metabolic Panel; Future - CBC; Future  2. Smoking - encouraged to start chantix  - Basic Metabolic Panel; Future - CBC; Future  3. Thrombocytopenia (HCC)  - Basic Metabolic Panel; Future - CBC; Future

## 2023-04-29 ENCOUNTER — Other Ambulatory Visit: Payer: Self-pay

## 2023-04-29 ENCOUNTER — Telehealth: Payer: Self-pay

## 2023-04-29 NOTE — Patient Outreach (Signed)
  Care Management  Transitions of Care Program Transitions of Care Post-discharge week 2  04/29/2023 Name: Don [name] MRN: [id number] DOB: September 16, 1955  Subjective: Don [name] is a 67 y.o. year old male who is a primary care patient of Alveria Apley, NP. The Care Management team was unable to reach the patient by phone to assess and address transitions of care needs.   Plan: Additional outreach attempts will be made to reach the patient enrolled in the [facility] Program (Post Inpatient/ED Visit).  Jodelle Gross RN, BSN, CCM RN Care Manager  Transitions of Care  [facility] - [facility]  [phone]

## 2023-04-30 ENCOUNTER — Telehealth: Payer: Self-pay

## 2023-04-30 ENCOUNTER — Other Ambulatory Visit: Payer: Self-pay | Admitting: Family Medicine

## 2023-04-30 ENCOUNTER — Other Ambulatory Visit: Payer: Self-pay

## 2023-04-30 DIAGNOSIS — I10 Essential (primary) hypertension: Secondary | ICD-10-CM

## 2023-04-30 NOTE — Patient Instructions (Signed)
Visit Information  Thank you for taking time to visit with me today. Please don't hesitate to contact me if I can be of assistance to you.   Following are the goals we discussed today:   Goals Addressed             This Visit's Progress    COMPLETED: 30 day TOC Program        Current Barriers:  Knowledge Deficits related to plan of care for management of COPD   RNCM Clinical Goal(s):  Patient will work with the Care Management team over the next 30 days to address Transition of Care Barriers: Medication access Diet/Nutrition/Food Resources Provider appointments through collaboration with RN Care manager, provider, and care team.   Interventions: Evaluation of current treatment plan related to  self management and patient's adherence to plan as established by provider  Transitions of Care:  Goal Met. Community Resource Referral Made to address Food Insecurity Tobacco Use Doctor Visits  - discussed the importance of doctor visits  Patient Goals/Self-Care Activities: Participate in Transition of Care Program/Attend [facility] scheduled calls Notify RN Care Manager of TOC call rescheduling needs Take all medications as prescribed Attend all scheduled provider appointments Call pharmacy for medication refills 3-7 days in advance of running out of medications Call provider office for new concerns or questions   Follow Up Plan:  The patient has been provided with contact information for the care management team and has been advised to call with any health related questions or concerns.          If you are experiencing a Mental Health or Behavioral Health Crisis or need someone to talk to, please call the Suicide and Crisis Lifeline: 988 call the [place] National Suicide Prevention Lifeline: 317-068-9308 or TTY: 978-017-1796 TTY 9123682831) to talk to a trained counselor   The patient verbalized understanding of instructions, educational materials, and care plan provided today and  DECLINED offer to receive copy of patient instructions, educational materials, and care plan.   The patient has been provided with contact information for the care management team and has been advised to call with any health related questions or concerns.  Jodelle Gross RN, BSN, CCM RN Care Manager  Transitions of Care  [facility] - [facility]  [phone]

## 2023-04-30 NOTE — Patient Outreach (Signed)
  Care Management  Transitions of Care Program Transitions of Care Post-discharge week 2   04/30/2023 Name: Don [name] MRN: [id number] DOB: Oct 14, 1955  Subjective: Don [name] is a 67 y.o. year old male who is a primary care patient of Alveria Apley, NP. The Care Management team Engaged with patient Engaged with patient by telephone to assess and address transitions of care needs.  Consent to Services:  Patient was given information about care management services, agreed to services, and gave verbal consent to participate.  Assessment: Successful outreach to patient this afternoon. He notes he is feeling very well and is still contemplating smoking cessation.  He has a prescription for Chantix to help him when he is ready to quit.  He is aware of the health related issues with continued smoking.  Patient feels he is doing well and does not need weekly calls anymore.   Goals Addressed             This Visit's Progress    COMPLETED: 30 day TOC Program        Current Barriers:  Knowledge Deficits related to plan of care for management of COPD   RNCM Clinical Goal(s):  Patient will work with the Care Management team over the next 30 days to address Transition of Care Barriers: Medication access Diet/Nutrition/Food Resources Provider appointments through collaboration with RN Care manager, provider, and care team.   Interventions: Evaluation of current treatment plan related to  self management and patient's adherence to plan as established by provider  Transitions of Care:  Goal Met. Community Resource Referral Made to address Food Insecurity Tobacco Use Doctor Visits  - discussed the importance of doctor visits  Patient Goals/Self-Care Activities: Participate in Transition of Care Program/Attend [facility] scheduled calls Notify RN Care Manager of TOC call rescheduling needs Take all medications as prescribed Attend all scheduled provider appointments Call  pharmacy for medication refills 3-7 days in advance of running out of medications Call provider office for new concerns or questions   Follow Up Plan:  The patient has been provided with contact information for the care management team and has been advised to call with any health related questions or concerns.           Plan: The patient has been provided with contact information for the care management team and has been advised to call with any health related questions or concerns.   Jodelle Gross RN, BSN, CCM RN Care Manager  Transitions of Care  [facility] - [facility]  [phone]

## 2023-05-07 ENCOUNTER — Telehealth: Payer: Self-pay | Admitting: Family Medicine

## 2023-05-07 NOTE — Telephone Encounter (Signed)
Byrd Hesselbach, RN with [facility] [phone] Ext [phone]  Re: Stiolto Would like to discuss a medication recommendation for Pt.  Byrd Hesselbach was informed NP is OOO.  Please return her call, at your earliest convenience.

## 2023-05-29 ENCOUNTER — Emergency Department (HOSPITAL_COMMUNITY): Payer: Medicare PPO

## 2023-05-29 ENCOUNTER — Encounter (HOSPITAL_COMMUNITY): Payer: Self-pay

## 2023-05-29 ENCOUNTER — Emergency Department (HOSPITAL_COMMUNITY)
Admission: EM | Admit: 2023-05-29 | Discharge: 2023-05-29 | Disposition: A | Discharge status: Home / Self Care | Payer: Medicare PPO | Attending: Emergency Medicine | Admitting: Emergency Medicine

## 2023-05-29 ENCOUNTER — Other Ambulatory Visit: Payer: Self-pay

## 2023-05-29 DIAGNOSIS — J449 Chronic obstructive pulmonary disease, unspecified: Secondary | ICD-10-CM | POA: Insufficient documentation

## 2023-05-29 DIAGNOSIS — Z1152 Encounter for screening for COVID-19: Secondary | ICD-10-CM | POA: Insufficient documentation

## 2023-05-29 DIAGNOSIS — K2980 Duodenitis without bleeding: Secondary | ICD-10-CM | POA: Insufficient documentation

## 2023-05-29 DIAGNOSIS — Z79899 Other long term (current) drug therapy: Secondary | ICD-10-CM | POA: Insufficient documentation

## 2023-05-29 DIAGNOSIS — I10 Essential (primary) hypertension: Secondary | ICD-10-CM | POA: Diagnosis not present

## 2023-05-29 DIAGNOSIS — Z85828 Personal history of other malignant neoplasm of skin: Secondary | ICD-10-CM | POA: Diagnosis not present

## 2023-05-29 DIAGNOSIS — Z7982 Long term (current) use of aspirin: Secondary | ICD-10-CM | POA: Insufficient documentation

## 2023-05-29 DIAGNOSIS — R059 Cough, unspecified: Secondary | ICD-10-CM | POA: Diagnosis not present

## 2023-05-29 DIAGNOSIS — R1013 Epigastric pain: Secondary | ICD-10-CM | POA: Diagnosis not present

## 2023-05-29 DIAGNOSIS — K828 Other specified diseases of gallbladder: Secondary | ICD-10-CM | POA: Diagnosis not present

## 2023-05-29 DIAGNOSIS — R109 Unspecified abdominal pain: Secondary | ICD-10-CM | POA: Diagnosis not present

## 2023-05-29 DIAGNOSIS — N281 Cyst of kidney, acquired: Secondary | ICD-10-CM | POA: Diagnosis not present

## 2023-05-29 DIAGNOSIS — R110 Nausea: Secondary | ICD-10-CM | POA: Diagnosis present

## 2023-05-29 LAB — CBC WITH DIFFERENTIAL/PLATELET
Abs Immature Granulocytes: 0.06 10*3/uL (ref 0.00–0.07)
Basophils Absolute: 0 10*3/uL (ref 0.0–0.1)
Basophils Relative: 0 %
Eosinophils Absolute: 0 10*3/uL (ref 0.0–0.5)
Eosinophils Relative: 0 %
HCT: 40.6 % (ref 39.0–52.0)
Hemoglobin: 14.2 g/dL (ref 13.0–17.0)
Immature Granulocytes: 1 %
Lymphocytes Relative: 10 %
Lymphs Abs: 1.2 10*3/uL (ref 0.7–4.0)
MCH: 32.1 pg (ref 26.0–34.0)
MCHC: 35 g/dL (ref 30.0–36.0)
MCV: 91.6 fL (ref 80.0–100.0)
Monocytes Absolute: 0.6 10*3/uL (ref 0.1–1.0)
Monocytes Relative: 5 %
Neutro Abs: 9.9 10*3/uL — ABNORMAL HIGH (ref 1.7–7.7)
Neutrophils Relative %: 84 %
Platelets: 248 10*3/uL (ref 150–400)
RBC: 4.43 MIL/uL (ref 4.22–5.81)
RDW: 12.8 % (ref 11.5–15.5)
WBC: 11.9 10*3/uL — ABNORMAL HIGH (ref 4.0–10.5)
nRBC: 0 % (ref 0.0–0.2)

## 2023-05-29 LAB — COMPREHENSIVE METABOLIC PANEL
ALT: 19 U/L (ref 0–44)
AST: 24 U/L (ref 15–41)
Albumin: 4.1 g/dL (ref 3.5–5.0)
Alkaline Phosphatase: 66 U/L (ref 38–126)
Anion gap: 6 (ref 5–15)
BUN: 13 mg/dL (ref 8–23)
CO2: 27 mmol/L (ref 22–32)
Calcium: 10.1 mg/dL (ref 8.9–10.3)
Chloride: 98 mmol/L (ref 98–111)
Creatinine, Ser: 0.8 mg/dL (ref 0.61–1.24)
GFR, Estimated: >60 mL/min (ref >60)
Glucose, Bld: 135 mg/dL — ABNORMAL HIGH (ref 70–99)
Potassium: 4.4 mmol/L (ref 3.5–5.1)
Sodium: 131 mmol/L — ABNORMAL LOW (ref 135–145)
Total Bilirubin: 1.1 mg/dL (ref <1.2)
Total Protein: 7.4 g/dL (ref 6.5–8.1)

## 2023-05-29 LAB — RESP PANEL BY RT-PCR (RSV, FLU A&B, COVID)  RVPGX2
Influenza A by PCR: NEGATIVE
Influenza B by PCR: NEGATIVE
Resp Syncytial Virus by PCR: NEGATIVE
SARS Coronavirus 2 by RT PCR: NEGATIVE

## 2023-05-29 LAB — LIPASE, BLOOD: Lipase: 24 U/L (ref 11–51)

## 2023-05-29 MED ORDER — AMOXICILLIN-POT CLAVULANATE 875-125 MG PO TABS
1.0000 | ORAL_TABLET | Freq: Once | ORAL | Status: AC
  Administered 2023-05-29: 1 via ORAL
  Filled 2023-05-29: qty 1

## 2023-05-29 MED ORDER — AMOXICILLIN-POT CLAVULANATE 875-125 MG PO TABS: 1.0000 | ORAL_TABLET | Freq: Two times a day (BID) | ORAL | 0 refills | Status: AC

## 2023-05-29 MED ORDER — ONDANSETRON 4 MG PO TBDP: 4.0000 mg | ORAL_TABLET | Freq: Three times a day (TID) | ORAL | 0 refills | Status: AC | PRN

## 2023-05-29 MED ORDER — IOHEXOL 300 MG/ML  SOLN
100.0000 mL | Freq: Once | INTRAMUSCULAR | Status: AC | PRN
  Administered 2023-05-29: 100 mL via INTRAVENOUS

## 2023-05-29 MED ORDER — PROCHLORPERAZINE EDISYLATE 10 MG/2ML IJ SOLN
10.0000 mg | Freq: Once | INTRAMUSCULAR | Status: AC
  Administered 2023-05-29: 10 mg via INTRAVENOUS
  Filled 2023-05-29: qty 2

## 2023-05-29 NOTE — ED Triage Notes (Signed)
Pt to ED by EMS from home with c/o HTN and nausea which began earlier today. Pt is dry heaving on arrival.

## 2023-05-29 NOTE — ED Provider Notes (Signed)
[place] [facility] AT [facility] Provider Note  CSN: [id number] Arrival date & time: 05/29/23 0041  Chief Complaint(s) Hypertension and Nausea  HPI Don [name] is a 67 y.o. male here with generalized malaise, myalgia, nasal congestion, nausea, and dry heaving,  who noted elevated blood pressures today. Reports taking OTC decongestants for the past couple of days. Reports coughing. No SOB, no chest pain. Mild LUQ pain. No diarrhea. No urinary sx.  The history is provided by the patient.    Past Medical History 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   Skin cancer    Patient Active Problem List   Diagnosis Date Noted   Essential hypertension 04/20/2023   Acute exacerbation of chronic obstructive pulmonary disease (COPD) (HCC) 04/19/2023   Hyponatremia 04/19/2023   Hypokalemia 04/19/2023   Thrombocytopenia (HCC) 04/19/2023   Acquired deformity of face 10/08/2022   Tobacco abuse counseling 08/09/2022   Acquired deformity of right ear 07/22/2022   Tobacco abuse 07/22/2022   Squamous cell carcinoma, ear, 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   Abdominal pain 06/10/2022   Abnormal CT scan, kidney 06/10/2022   Substance use 06/10/2022   Renal infarct (HCC) 06/10/2022   Aortic atherosclerosis (HCC) 06/10/2022   History of elevated glucose 10/03/2021   Nocturia 10/03/2021   Smokes less than 1 pack a day with greater than 30 pack year history 10/03/2021   Skin cancer of forehead 09/27/2020   Squamous cell skin cancer, face 09/27/2020   Primary hypertension 02/07/2014   Osteoarthritis 02/07/2014   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 Authorizing Provider  amoxicillin-clavulanate (AUGMENTIN) 875-125 MG tablet Take 1 tablet by mouth every 12 (twelve) hours. 05/29/23  Yes Leny Morozov, Amadeo Garnet, MD  ondansetron (ZOFRAN-ODT) 4 MG disintegrating tablet Take 1 tablet (4 mg total) by mouth every 8 (eight) hours as needed for up to 3 days for nausea or vomiting. 05/29/23 06/01/23 Yes Sophi Calligan, Amadeo Garnet, MD  acetaminophen (TYLENOL) 500 MG tablet Take 1 tablet (500 mg total) by mouth every 6 (six) hours as needed. 08/28/22   Scarlette Ar, MD  albuterol (VENTOLIN HFA) 108 (90 Base) MCG/ACT inhaler Inhale 1-2 puffs into the lungs every 4 (four) hours as needed for wheezing or shortness of breath. 04/21/23   Danford, Earl Lites, MD  aspirin 81 MG chewable tablet Chew 1 tablet (81 mg total) by mouth daily. 06/12/22   Standley Brooking, MD  benzonatate (TESSALON) 100 MG capsule Take 1 capsule (100 mg total) by mouth 2 (two) times daily as needed for cough. Patient not taking: Reported on 04/30/2023 08/09/22   Alveria Apley, NP  diltiazem (CARDIZEM CD) 120 MG 24 hr capsule  Take 1 capsule (120 mg total) by mouth daily. 04/21/23   Danford, Earl Lites, MD  diphenhydrAMINE (BENADRYL) 25 MG tablet Take 25 mg by mouth daily as needed for allergies or itching.    [provider]  HYDROcodone bit-homatropine (HYCODAN) 5-1.5 MG/5ML syrup Take 5 mLs by mouth every 6 (six) hours as needed for cough. Patient not taking: Reported on 04/30/2023 04/18/23   Gwyneth Sprout, MD   losartan-hydrochlorothiazide [facility]) 100-12.5 MG tablet Take 1 tablet by mouth once daily 04/30/23   Alveria Apley, NP  Multiple Vitamins-Minerals (MULTIVITAMIN WITH MINERALS) tablet Take 1 tablet by mouth daily.    [provider]  predniSONE (DELTASONE) 10 MG tablet 40 mg x 3 days, 20 mg x 3 days, 10 mg x 3 days 04/17/23   Shirline Frees, NP  predniSONE (DELTASONE) 10 MG tablet Take daily as directed according to taper. 04/21/23   Danford, Earl Lites, MD  tamsulosin (FLOMAX) 0.4 MG CAPS capsule Take 1 capsule (0.4 mg total) by mouth daily. 02/07/23   Alveria Apley, NP  Varenicline Tartrate, Starter, (CHANTIX STARTING MONTH PAK) 0.5 MG X 11 & 1 MG X 42 TBPK Take as directed on package Patient not taking: Reported on 04/30/2023 04/21/23   Alberteen Sam, MD                                                                                                                                    Allergies Patient has no known allergies.  Review of Systems Review of Systems As noted in HPI  Physical Exam Vital Signs  I have reviewed the triage vital signs BP (!) 187/104 (BP Location: Right Arm)   Pulse (!) 101   Temp 98.6 F (37 C) (Oral)   Resp 15   SpO2 99%   Physical Exam Vitals reviewed.  Constitutional:      General: He is not in acute distress.    Appearance: He is well-developed. He is not diaphoretic.  HENT:     Head: Normocephalic and atraumatic.     Nose: Nose normal.  Eyes:     General: No scleral icterus.       Right eye: No discharge.        Left eye: No discharge.     Conjunctiva/sclera: Conjunctivae normal.     Pupils: Pupils are equal, round, and reactive to light.  Cardiovascular:     Rate and Rhythm: Normal rate and regular rhythm.     Heart sounds: No murmur heard.    No friction rub. No gallop.  Pulmonary:     Effort: Pulmonary effort is normal. No respiratory distress.     Breath sounds: Normal breath sounds. No stridor. No  rales.  Abdominal:     General: There is no distension.     Palpations: Abdomen is soft.     Tenderness: There is abdominal tenderness in the epigastric area.  Musculoskeletal:  General: No tenderness.     Cervical back: Normal range of motion and neck supple.  Skin:    General: Skin is warm and dry.     Findings: No erythema or rash.  Neurological:     Mental Status: He is alert and oriented to person, place, and time.     ED Results and Treatments Labs (all labs ordered are listed, but only abnormal results are displayed) Labs Reviewed  CBC WITH DIFFERENTIAL/PLATELET - Abnormal; Notable for the following components:      Result Value   WBC 11.9 (*)    Neutro Abs 9.9 (*)    All other components within normal limits  COMPREHENSIVE METABOLIC PANEL - Abnormal; Notable for the following components:   Sodium 131 (*)    Glucose, Bld 135 (*)    All other components within normal limits  RESP PANEL BY RT-PCR (RSV, FLU A&B, COVID)  RVPGX2  LIPASE, BLOOD                                                                                                                         EKG  EKG Interpretation Date/Time:  Thursday May 29 2023 57:84:69 EST Ventricular Rate:  67 PR Interval:  161 QRS Duration:  97 QT Interval:  393 QTC Calculation: 415 R Axis:   -46  Text Interpretation: Sinus rhythm Left axis deviation Abnormal R-wave progression, early transition Confirmed by Drema Pry [phone]) on 05/29/2023 3:19:07 AM       Radiology CT ABDOMEN PELVIS W CONTRAST Result Date: 05/29/2023 CLINICAL DATA:  Abdominal pain EXAM: CT ABDOMEN AND PELVIS WITH CONTRAST TECHNIQUE: Multidetector CT imaging of the abdomen and pelvis was performed using the standard protocol following bolus administration of intravenous contrast. RADIATION DOSE REDUCTION: This exam was performed according to the departmental dose-optimization program which includes automated exposure control, adjustment of  the mA and/or kV according to patient size and/or use of iterative reconstruction technique. CONTRAST:  OMNIPAQUE IOHEXOL 300 MG/ML  SOLN COMPARISON:  06/09/2022 FINDINGS: Lower chest: No acute abnormality Hepatobiliary: Gallbladder is mildly distended. No biliary ductal dilatation or focal hepatic abnormality. Pancreas: No focal abnormality or ductal dilatation. Spleen: No focal abnormality.  Normal size. Adrenals/Urinary Tract: Adrenal glands normal. Bilateral renal cysts, the largest an exophytic cyst off the upper pole of the left kidney measuring 6.3 cm. These have a benign appearance. No follow-up imaging recommended. No stones or hydronephrosis. Urinary bladder unremarkable. Stomach/Bowel: Normal appendix. Wall thickening in the proximal duodenum compatible with duodenitis. Stomach, remainder of the small bowel and large bowel otherwise unremarkable. Vascular/Lymphatic: Aortic atherosclerosis. No evidence of aneurysm or adenopathy. Reproductive: No visible focal abnormality. Other: No free fluid or free air. Musculoskeletal: No acute bony abnormality. IMPRESSION: Wall thickening and surrounding inflammation involving the 1st and 2nd portions of the duodenum compatible with duodenitis. Aortic atherosclerosis. Electronically Signed   By: Charlett Nose M.D.   On: 05/29/2023 03:39   DG Chest Port 1 View Result Date: 05/29/2023  CLINICAL DATA:  Cough and nausea. EXAM: PORTABLE CHEST 1 VIEW COMPARISON:  April 18, 2019 FINDINGS: The heart size and mediastinal contours are within normal limits. Both lungs are clear. Multilevel degenerative changes are seen throughout the thoracic spine. IMPRESSION: No active disease. Electronically Signed   By: Aram Candela M.D.   On: 05/29/2023 01:53    Medications Ordered in ED Medications  prochlorperazine (COMPAZINE) injection 10 mg (10 mg Intravenous Given 05/29/23 0142)  iohexol (OMNIPAQUE) 300 MG/ML solution 100 mL (100 mLs Intravenous Contrast Given  05/29/23 0313)  amoxicillin-clavulanate (AUGMENTIN) 875-125 MG per tablet 1 tablet (1 tablet Oral Given 05/29/23 0443)   Procedures Procedures  (including critical care time) Medical Decision Making / ED Course   Medical Decision Making Amount and/or Complexity of Data Reviewed Labs: ordered. Radiology: ordered.  Risk Prescription drug management.    Differential diagnoses considered listed below  Generalized malaise Workup was notable for evidence of duodenitis likely contributing to the patient's presenting symptoms.  CBC does have leukocytosis but appears to be improved from his baseline during previous ones.  There is no anemia.  CMP without significant electrolyte derangements or renal sufficiency.  Mild hyperglycemia without DKA.  No evidence of bili obstruction or pancreatitis. COVID flu/RSV was negative.  Chest x-ray without evidence of pneumonia Patient treated with IV fluids, antiemetics.  Able to tolerate p.o. Given first dose of Augmentin in the emergency department.  Hypertension Likely secondary to OTC decongestant use. No evidence of endorgan damage.    Final Clinical Impression(s) / ED Diagnoses Final diagnoses:  Duodenitis   The patient appears reasonably screened and/or stabilized for discharge and I doubt any other medical condition or other [facility] requiring further screening, evaluation, or treatment in the ED at this time. I have discussed the findings, Dx and Tx plan with the patient/family who expressed understanding and agree(s) with the plan. Discharge instructions discussed at length. The patient/family was given strict return precautions who verbalized understanding of the instructions. No further questions at time of discharge.  Disposition: Discharge  Condition: Good  ED Discharge Orders          Ordered    amoxicillin-clavulanate (AUGMENTIN) 875-125 MG tablet  Every 12 hours        05/29/23 0546    ondansetron (ZOFRAN-ODT) 4 MG disintegrating  tablet  Every 8 hours PRN        05/29/23 0546              Follow Up: Alveria Apley, NP [id number] [place] [address] [place] [address] [phone]  Call  to schedule an appointment for close follow up     This chart was dictated using voice recognition software.  Despite best efforts to proofread,  errors can occur which can change the documentation meaning.    Nira Conn, MD 05/29/23 [phone]

## 2023-05-31 ENCOUNTER — Emergency Department (HOSPITAL_COMMUNITY)
Admission: EM | Admit: 2023-05-31 | Discharge: 2023-05-31 | Disposition: A | Discharge status: Home / Self Care | Payer: Medicare PPO | Attending: Emergency Medicine | Admitting: Emergency Medicine

## 2023-05-31 ENCOUNTER — Encounter (HOSPITAL_COMMUNITY): Payer: Self-pay | Admitting: Emergency Medicine

## 2023-05-31 DIAGNOSIS — J449 Chronic obstructive pulmonary disease, unspecified: Secondary | ICD-10-CM | POA: Insufficient documentation

## 2023-05-31 DIAGNOSIS — Z79899 Other long term (current) drug therapy: Secondary | ICD-10-CM | POA: Insufficient documentation

## 2023-05-31 DIAGNOSIS — Z7982 Long term (current) use of aspirin: Secondary | ICD-10-CM | POA: Insufficient documentation

## 2023-05-31 DIAGNOSIS — E876 Hypokalemia: Secondary | ICD-10-CM | POA: Insufficient documentation

## 2023-05-31 DIAGNOSIS — F17210 Nicotine dependence, cigarettes, uncomplicated: Secondary | ICD-10-CM | POA: Diagnosis not present

## 2023-05-31 DIAGNOSIS — I10 Essential (primary) hypertension: Secondary | ICD-10-CM | POA: Insufficient documentation

## 2023-05-31 DIAGNOSIS — R1013 Epigastric pain: Secondary | ICD-10-CM | POA: Insufficient documentation

## 2023-05-31 DIAGNOSIS — D72829 Elevated white blood cell count, unspecified: Secondary | ICD-10-CM | POA: Insufficient documentation

## 2023-05-31 DIAGNOSIS — Z85828 Personal history of other malignant neoplasm of skin: Secondary | ICD-10-CM | POA: Diagnosis not present

## 2023-05-31 LAB — CBC WITH DIFFERENTIAL/PLATELET
Abs Immature Granulocytes: 0.09 10*3/uL — ABNORMAL HIGH (ref 0.00–0.07)
Basophils Absolute: 0.1 10*3/uL (ref 0.0–0.1)
Basophils Relative: 1 %
Eosinophils Absolute: 0.1 10*3/uL (ref 0.0–0.5)
Eosinophils Relative: 0 %
HCT: 46.6 % (ref 39.0–52.0)
Hemoglobin: 16.1 g/dL (ref 13.0–17.0)
Immature Granulocytes: 1 %
Lymphocytes Relative: 14 %
Lymphs Abs: 2.3 10*3/uL (ref 0.7–4.0)
MCH: 31.6 pg (ref 26.0–34.0)
MCHC: 34.5 g/dL (ref 30.0–36.0)
MCV: 91.4 fL (ref 80.0–100.0)
Monocytes Absolute: 1.5 10*3/uL — ABNORMAL HIGH (ref 0.1–1.0)
Monocytes Relative: 9 %
Neutro Abs: 13 10*3/uL — ABNORMAL HIGH (ref 1.7–7.7)
Neutrophils Relative %: 75 %
Platelets: 196 10*3/uL (ref 150–400)
RBC: 5.1 MIL/uL (ref 4.22–5.81)
RDW: 12.8 % (ref 11.5–15.5)
WBC: 17.1 10*3/uL — ABNORMAL HIGH (ref 4.0–10.5)
nRBC: 0 % (ref 0.0–0.2)

## 2023-05-31 LAB — LIPASE, BLOOD: Lipase: 24 U/L (ref 11–51)

## 2023-05-31 LAB — COMPREHENSIVE METABOLIC PANEL
ALT: 19 U/L (ref 0–44)
AST: 26 U/L (ref 15–41)
Albumin: 4.3 g/dL (ref 3.5–5.0)
Alkaline Phosphatase: 67 U/L (ref 38–126)
Anion gap: 12 (ref 5–15)
BUN: 13 mg/dL (ref 8–23)
CO2: 23 mmol/L (ref 22–32)
Calcium: 10.3 mg/dL (ref 8.9–10.3)
Chloride: 98 mmol/L (ref 98–111)
Creatinine, Ser: 0.88 mg/dL (ref 0.61–1.24)
GFR, Estimated: >60 mL/min (ref >60)
Glucose, Bld: 109 mg/dL — ABNORMAL HIGH (ref 70–99)
Potassium: 3.2 mmol/L — ABNORMAL LOW (ref 3.5–5.1)
Sodium: 133 mmol/L — ABNORMAL LOW (ref 135–145)
Total Bilirubin: 1.2 mg/dL — ABNORMAL HIGH (ref <1.2)
Total Protein: 7.9 g/dL (ref 6.5–8.1)

## 2023-05-31 MED ORDER — SUCRALFATE 1 G PO TABS: 1.0000 g | ORAL_TABLET | Freq: Three times a day (TID) | ORAL | 0 refills | Status: AC

## 2023-05-31 MED ORDER — ONDANSETRON 4 MG PO TBDP
4.0000 mg | ORAL_TABLET | Freq: Once | ORAL | Status: AC
  Administered 2023-05-31: 4 mg via ORAL
  Filled 2023-05-31: qty 1

## 2023-05-31 MED ORDER — ONDANSETRON HCL 4 MG/2ML IJ SOLN
4.0000 mg | Freq: Once | INTRAMUSCULAR | Status: AC
Start: 2023-05-31 — End: 2023-05-31
  Administered 2023-05-31: 4 mg via INTRAVENOUS
  Filled 2023-05-31: qty 2

## 2023-05-31 MED ORDER — PANTOPRAZOLE SODIUM 20 MG PO TBEC: 20.0000 mg | DELAYED_RELEASE_TABLET | Freq: Every day | ORAL | 0 refills | Status: AC

## 2023-05-31 MED ORDER — LIDOCAINE VISCOUS HCL 2 % MT SOLN
15.0000 mL | Freq: Once | OROMUCOSAL | Status: AC
  Administered 2023-05-31: 15 mL via ORAL
  Filled 2023-05-31: qty 15

## 2023-05-31 MED ORDER — ALUM & MAG HYDROXIDE-SIMETH 200-200-20 MG/5ML PO SUSP
30.0000 mL | Freq: Once | ORAL | Status: AC
Start: 2023-05-31 — End: 2023-05-31
  Administered 2023-05-31: 30 mL via ORAL
  Filled 2023-05-31: qty 30

## 2023-05-31 MED ORDER — POTASSIUM CHLORIDE CRYS ER 20 MEQ PO TBCR: 20.0000 meq | EXTENDED_RELEASE_TABLET | Freq: Every day | ORAL | 0 refills | Status: AC

## 2023-05-31 NOTE — Discharge Instructions (Addendum)
It was a pleasure caring for you today in the emergency department.  Be sure to follow a bland diet over the next few weeks.  No fried food or spicy food, no alcohol tobacco.  Please call gastroenterology to arrange follow-up in the next 2 weeks.  Please follow-up with your PCP in regards to your elevated blood pressure reading  Please return to the emergency department for any worsening or worrisome symptoms.

## 2023-05-31 NOTE — ED Provider Notes (Signed)
[place] [facility] AT [facility] Provider Note  CSN: [id number] Arrival date & time: 05/31/23 0400  Chief Complaint(s) Hypertension  HPI Don [name] is a 67 y.o. male with past medical history as below, significant for depression, hypertension, COPD, chronic pain who presents to the ED with complaint of elevated blood pressure, difficulty sleeping.  He was seen here last night, started on antibiotics for presumed duodenitis discharged in stable condition.  Returns today because he thought his blood pressure was elevated, reports also that he was having difficulty sleeping.  Says he sometimes feels like he is gagging when he lies down.  Burning sensation in his throat and epigastrium, similar to the symptoms he was experiencing last night.  Has not tried any OTC medications for symptoms  Past Medical History 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   Skin cancer    Patient Active Problem List   Diagnosis Date Noted   Essential hypertension 04/20/2023   Acute exacerbation of chronic obstructive pulmonary disease (COPD) (HCC) 04/19/2023   Hyponatremia 04/19/2023   Hypokalemia 04/19/2023   Thrombocytopenia (HCC) 04/19/2023   Acquired deformity of face 10/08/2022   Tobacco abuse counseling 08/09/2022   Acquired deformity of right ear 07/22/2022   Tobacco abuse 07/22/2022   Squamous cell carcinoma, ear, 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   Abdominal pain 06/10/2022   Abnormal CT scan, kidney 06/10/2022   Substance use 06/10/2022   Renal infarct (HCC) 06/10/2022   Aortic atherosclerosis (HCC) 06/10/2022   History  of elevated glucose 10/03/2021   Nocturia 10/03/2021   Smokes less than 1 pack a day with greater than 30 pack year history 10/03/2021   Skin cancer of forehead 09/27/2020   Squamous cell skin cancer, face 09/27/2020   Primary hypertension 02/07/2014   Osteoarthritis 02/07/2014   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 Authorizing Provider  pantoprazole (PROTONIX) 20 MG tablet Take 1 tablet (20 mg total) by mouth daily for 14 days. 05/31/23 06/14/23 Yes Tanda Rockers A, DO  potassium chloride SA (KLOR-CON M) 20 MEQ tablet Take 1 tablet (20 mEq total) by mouth daily for 3 days. 05/31/23 06/03/23 Yes Tanda Rockers A, DO  sucralfate (CARAFATE) 1 g tablet Take 1 tablet (1 g total) by mouth with breakfast, with lunch, and with evening meal for 7 days. 05/31/23 06/07/23 Yes Sloan Leiter, DO  acetaminophen (TYLENOL) 500 MG tablet Take 1 tablet (500 mg total) by mouth every 6 (six) hours as needed. 08/28/22   Scarlette Ar, MD  albuterol (VENTOLIN HFA) 108 (90 Base) MCG/ACT inhaler Inhale 1-2 puffs into the lungs every 4 (four) hours as needed for wheezing or shortness of breath. 04/21/23  Danford, Earl Lites, MD  amoxicillin-clavulanate (AUGMENTIN) 875-125 MG tablet Take 1 tablet by mouth every 12 (twelve) hours. 05/29/23   Nira Conn, MD  aspirin 81 MG chewable tablet Chew 1 tablet (81 mg total) by mouth daily. 06/12/22   Standley Brooking, MD  benzonatate (TESSALON) 100 MG capsule Take 1 capsule (100 mg total) by mouth 2 (two) times daily as needed for cough. Patient not  taking: Reported on 04/30/2023 08/09/22   Alveria Apley, NP  diltiazem (CARDIZEM CD) 120 MG 24 hr capsule Take 1 capsule (120 mg total) by mouth daily. 04/21/23   Danford, Earl Lites, MD  diphenhydrAMINE (BENADRYL) 25 MG tablet Take 25 mg by mouth daily as needed for allergies or itching.    [provider]  HYDROcodone bit-homatropine (HYCODAN) 5-1.5 MG/5ML syrup Take 5 mLs by mouth every 6 (six) hours as needed for cough. Patient not taking: Reported on 04/30/2023 04/18/23   Gwyneth Sprout, MD  losartan-hydrochlorothiazide [facility]) 100-12.5 MG tablet Take 1 tablet by mouth once daily 04/30/23   Alveria Apley, NP  Multiple Vitamins-Minerals (MULTIVITAMIN WITH MINERALS) tablet Take 1 tablet by mouth daily.    [provider]  ondansetron (ZOFRAN-ODT) 4 MG disintegrating tablet Take 1 tablet (4 mg total) by mouth every 8 (eight) hours as needed for up to 3 days for nausea or vomiting. 05/29/23 06/01/23  Nira Conn, MD  predniSONE (DELTASONE) 10 MG tablet 40 mg x 3 days, 20 mg x 3 days, 10 mg x 3 days 04/17/23   Shirline Frees, NP  predniSONE (DELTASONE) 10 MG tablet Take daily as directed according to taper. 04/21/23   Danford, Earl Lites, MD  tamsulosin (FLOMAX) 0.4 MG CAPS capsule Take 1 capsule (0.4 mg total) by mouth daily. 02/07/23   Alveria Apley, NP  Varenicline Tartrate, Starter, (CHANTIX STARTING MONTH PAK) 0.5 MG X 11 & 1 MG X 42 TBPK Take as directed on package Patient not taking: Reported on 04/30/2023 04/21/23   Alberteen Sam, MD                                                                                                                                    Past Surgical History Past Surgical History:  Procedure Laterality Date   cartilage removed from both knees     EXCISION MASS HEAD Right 08/28/2022   Procedure: EXCISION OF EAR WOUND;  Surgeon: Scarlette Ar, MD;  Location: [facility] OR;  Service: ENT;  Laterality: Right;    EXCISION MASS HEAD N/A 10/03/2022   Procedure: EXCISION WOUNDHEAD LESS THAN 100 CM^2 FOR FLAP;  Surgeon: Scarlette Ar, MD;  Location: [facility] OR;  Service: ENT;  Laterality: N/A;   KNEE ARTHROSCOPY     left wrist     SKIN CANCER EXCISION     SKIN FULL THICKNESS GRAFT Right 08/28/2022   Procedure: RIGHT EXCISION OF EAR WOUND, ADJACENT TISSUE TRANSFER, RIGHT SKIN  GRAFT FROM NECK TO EAR;  Surgeon: Scarlette Ar, MD;  Location: [facility] OR;  Service: ENT;  Laterality: Right;   SKIN FULL THICKNESS GRAFT N/A 10/03/2022   Procedure: ADJACENT TISSUE TRANSFER LEFT CHEEK AND RIGHT EAR, FULL THICKNESS SKIN GRAFT LEFT NECK TO LEFT LOWER EYELID AND RIGHT EAR;  Surgeon: Scarlette Ar, MD;  Location: [facility] OR;  Service: ENT;  Laterality: N/A;   Family History Family History  Problem Relation Age of Onset   Cancer Father        Skin   Diabetes Father    Hypertension Father     Social History Social History   Tobacco Use   Smoking status: Every Day    Current packs/day: 0.50    Average packs/day: 0.5 packs/day for 30.0 years (15.0 ttl pk-yrs)    Types: Cigarettes    Passive exposure: Current   Smokeless tobacco: Never  Vaping Use   Vaping status: Never Used  Substance Use Topics   Alcohol use: No   Drug use: No   Allergies Patient has no known allergies.  Review of Systems Review of Systems  Constitutional:  Negative for chills and fever.  Respiratory:  Negative for chest tightness and shortness of breath.   Cardiovascular:  Negative for chest pain and palpitations.  Gastrointestinal:  Positive for abdominal pain. Negative for nausea and vomiting.  Genitourinary:  Negative for dysuria.  Musculoskeletal:  Negative for arthralgias.  Neurological:  Negative for headaches.  All other systems reviewed and are negative.   Physical Exam Vital Signs  I have reviewed the triage vital signs BP (!) 169/113   Pulse 98   Temp 98.1 F (36.7 C) (Oral)   Resp 16   Ht 5\' 10"  (1.778 m)   Wt 82.6 kg   SpO2  100%   BMI 26.11 kg/m  Physical Exam Vitals and nursing note reviewed.  Constitutional:      General: He is not in acute distress.    Appearance: Normal appearance. He is well-developed. He is not ill-appearing.  HENT:     Head: Normocephalic and atraumatic.     Right Ear: External ear normal.     Left Ear: External ear normal.     Nose: Nose normal.     Mouth/Throat:     Mouth: Mucous membranes are moist.  Eyes:     General: No scleral icterus.       Right eye: No discharge.        Left eye: No discharge.  Cardiovascular:     Rate and Rhythm: Normal rate.  Pulmonary:     Effort: Pulmonary effort is normal. No respiratory distress.     Breath sounds: No stridor.  Abdominal:     General: Abdomen is flat. There is no distension.     Tenderness: There is no guarding.  Musculoskeletal:        General: No deformity.     Cervical back: No rigidity.  Skin:    General: Skin is warm and dry.     Coloration: Skin is not cyanotic, jaundiced or pale.  Neurological:     Mental Status: He is alert and oriented to person, place, and time.     GCS: GCS eye subscore is 4. GCS verbal subscore is 5. GCS motor subscore is 6.  Psychiatric:        Speech: Speech normal.        Behavior: Behavior normal. Behavior is cooperative.     ED Results and Treatments Labs (all  labs ordered are listed, but only abnormal results are displayed) Labs Reviewed  CBC WITH DIFFERENTIAL/PLATELET - Abnormal; Notable for the following components:      Result Value   WBC 17.1 (*)    Neutro Abs 13.0 (*)    Monocytes Absolute 1.5 (*)    Abs Immature Granulocytes 0.09 (*)    All other components within normal limits  COMPREHENSIVE METABOLIC PANEL - Abnormal; Notable for the following components:   Sodium 133 (*)    Potassium 3.2 (*)    Glucose, Bld 109 (*)    Total Bilirubin 1.2 (*)    All other components within normal limits  LIPASE, BLOOD                                                                                                                           Radiology No results found.  Pertinent labs & imaging results that were available during my care of the patient were reviewed by me and considered in my medical decision making (see MDM for details).  Medications Ordered in ED Medications  alum & mag hydroxide-simeth (MAALOX/MYLANTA) 200-200-20 MG/5ML suspension 30 mL (30 mLs Oral Given 05/31/23 0717)    And  lidocaine (XYLOCAINE) 2 % viscous mouth solution 15 mL (15 mLs Oral Given 05/31/23 0718)  ondansetron (ZOFRAN-ODT) disintegrating tablet 4 mg (4 mg Oral Given 05/31/23 0511)  ondansetron (ZOFRAN) injection 4 mg (4 mg Intravenous Given 05/31/23 0606)                                                                                                                                     Procedures Procedures  (including critical care time)  Medical Decision Making / ED Course    Medical Decision Making:    Don [name] is a 67 y.o. male  with past medical history as below, significant for depression, hypertension, COPD, chronic pain who presents to the ED with complaint of elevated blood pressure, difficulty sleeping.. The complaint involves an extensive differential diagnosis and also carries with it a high risk of complications and morbidity.  Serious etiology was considered. Ddx includes but is not limited to: Differential diagnosis includes but is not exclusive to acute cholecystitis, intrathoracic causes for epigastric abdominal pain, gastritis, duodenitis, pancreatitis, small bowel or large bowel obstruction, abdominal aortic aneurysm, hernia, gastritis, etc.   Complete initial physical exam performed, notably the patient  was in no distress, heart rate was mildly elevated in triage.    Reviewed and confirmed nursing documentation for past medical history, family history, social history.  Vital signs reviewed.      Clinical Course as of 05/31/23 0815  Sat May 31, 2023  0700 WBC(!): 17.1 Chronically elevated WBC, no fever, doubt sepsis [SG]  0700 Total Bilirubin(!): 1.2 Similar to prior [SG]  0717 Symptoms improving, he is amenable to trying the Maalox/GI cocktail now. [SG]  0810 Potassium(!): 3.2 Mildly depleted, oral replacement [SG]    Clinical Course User Index [SG] Sloan Leiter, DO    Brief summary: 67 year old male with history as above here with concern for elevated blood pressure and epigastric discomfort, discomfort the back of his throat.  Patient reports same symptoms that he was experiencing when he was evaluated on 12/26.  Amy is stable.  Blood pressure is marginally elevated he does not exhibit evidence of endorgan damage.  Will give GI cocktail, p.o. challenge and reassess  Reviewed workup including imaging and labs from recent visit 12/26.  Labs reviewed today, similar to prior.  Symptoms resolved at this time following GI cocktail.  He is able tolerate p.o. intake of the difficulty.  Favor symptoms likely secondary to his recent diagnosed duodenitis, possible gastritis as well.  Start PPI, Carafate, he is already given Zofran prescription.  Encouraged bland diet.  Follow-up with GI in 2 weeks.  Return precautions advised. Smoking cessation   The patient improved significantly and was discharged in stable condition. Detailed discussions were had with the patient regarding current findings, and need for close f/u with PCP or on call doctor. The patient has been instructed to return immediately if the symptoms worsen in any way for re-evaluation. Patient verbalized understanding and is in agreement with current care plan. All questions answered prior to discharge.               Additional history obtained: -Additional history obtained from na -External records from outside source obtained and reviewed including: Chart review including previous notes, labs, imaging, consultation notes including  Since ED visit, recent labs  and imaging, home medications   Lab Tests: -I ordered, reviewed, and interpreted labs.   The pertinent results include:   Labs Reviewed  CBC WITH DIFFERENTIAL/PLATELET - Abnormal; Notable for the following components:      Result Value   WBC 17.1 (*)    Neutro Abs 13.0 (*)    Monocytes Absolute 1.5 (*)    Abs Immature Granulocytes 0.09 (*)    All other components within normal limits  COMPREHENSIVE METABOLIC PANEL - Abnormal; Notable for the following components:   Sodium 133 (*)    Potassium 3.2 (*)    Glucose, Bld 109 (*)    Total Bilirubin 1.2 (*)    All other components within normal limits  LIPASE, BLOOD    Notable for labs stable  EKG   EKG Interpretation Date/Time:    Ventricular Rate:    PR Interval:    QRS Duration:    QT Interval:    QTC Calculation:   R Axis:      Text Interpretation:           Imaging Studies ordered: na   Medicines ordered and prescription drug management: Meds ordered this encounter  Medications   AND Linked Order Group    alum & mag hydroxide-simeth (MAALOX/MYLANTA) 200-200-20 MG/5ML suspension 30 mL    lidocaine (XYLOCAINE) 2 % viscous mouth solution 15  mL   ondansetron (ZOFRAN-ODT) disintegrating tablet 4 mg   ondansetron (ZOFRAN) injection 4 mg   pantoprazole (PROTONIX) 20 MG tablet    Sig: Take 1 tablet (20 mg total) by mouth daily for 14 days.    Dispense:  14 tablet    Refill:  0   sucralfate (CARAFATE) 1 g tablet    Sig: Take 1 tablet (1 g total) by mouth with breakfast, with lunch, and with evening meal for 7 days.    Dispense:  21 tablet    Refill:  0   potassium chloride SA (KLOR-CON M) 20 MEQ tablet    Sig: Take 1 tablet (20 mEq total) by mouth daily for 3 days.    Dispense:  3 tablet    Refill:  0    -I have reviewed the patients home medicines and have made adjustments as needed   Consultations Obtained: na   Cardiac Monitoring: Continuous pulse oximetry interpreted by myself, 100% on RA.     Social Determinants of Health:  Diagnosis or treatment significantly limited by social determinants of health: current smoker Counseled patient for approximately 3 minutes regarding smoking cessation. Discussed risks of smoking and how they applied and affected their visit here today. Patient not ready to quit at this time, however will follow up with their primary doctor when they are.   CPT code: 84696: intermediate counseling for smoking cessation     Reevaluation: After the interventions noted above, I reevaluated the patient and found that they have improved  Co morbidities that complicate the patient evaluation  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   Skin cancer       Dispostion: Disposition decision including need for hospitalization was considered, and patient discharged from emergency department.    Final Clinical Impression(s) / ED Diagnoses Final diagnoses:  Epigastric pain  Hypokalemia        Sloan Leiter, DO 05/31/23 2952

## 2023-05-31 NOTE — ED Triage Notes (Signed)
Pt reports when he lies down, he is gagging and cannot fall asleep. Appears anxious and was seen here last night for same. Also concerned about his HTN. Did not check at home.

## 2023-05-31 NOTE — ED Notes (Signed)
Patient resting in bed comfortable breathing eyes closed no nausea and gagging at this time

## 2023-05-31 NOTE — ED Notes (Signed)
Labs sent but patient jumped up to vomit in bag and the catheter came out of arm before it was secured to the skin. Charge asked to obtain IV access.

## 2023-05-31 NOTE — ED Notes (Signed)
Nurse cleaned spit up off of floor in hallway from patient.

## 2023-05-31 NOTE — ED Notes (Signed)
Provider notified of patient nausea.

## 2023-06-02 ENCOUNTER — Other Ambulatory Visit: Payer: Self-pay | Admitting: Family Medicine

## 2023-06-02 DIAGNOSIS — R351 Nocturia: Secondary | ICD-10-CM

## 2023-06-05 ENCOUNTER — Ambulatory Visit (INDEPENDENT_AMBULATORY_CARE_PROVIDER_SITE_OTHER): Payer: No Typology Code available for payment source | Admitting: Internal Medicine

## 2023-06-05 ENCOUNTER — Encounter: Payer: Self-pay | Admitting: Internal Medicine

## 2023-06-05 VITALS — BP 120/72 | HR 93 | Temp 97.7°F | Ht 70.0 in | Wt 187.7 lb

## 2023-06-05 DIAGNOSIS — K2980 Duodenitis without bleeding: Secondary | ICD-10-CM | POA: Diagnosis not present

## 2023-06-05 DIAGNOSIS — Z09 Encounter for follow-up examination after completed treatment for conditions other than malignant neoplasm: Secondary | ICD-10-CM

## 2023-06-05 DIAGNOSIS — D0422 Carcinoma in situ of skin of left ear and external auricular canal: Secondary | ICD-10-CM | POA: Diagnosis not present

## 2023-06-05 DIAGNOSIS — I10 Essential (primary) hypertension: Secondary | ICD-10-CM | POA: Diagnosis not present

## 2023-06-05 MED ORDER — LOSARTAN POTASSIUM-HCTZ 100-12.5 MG PO TABS: 1.0000 | ORAL_TABLET | Freq: Every day | ORAL | 1 refills | Status: DC

## 2023-06-05 NOTE — Progress Notes (Signed)
 Established Patient Office Visit     CC/Reason for Visit: Hospital discharge follow-up  HPI: Don [name] is a 68 y.o. male who is coming in today for the above mentioned reasons.  He was hospitalized in November for 2 days with a COPD exacerbation due to rhinovirus.  He subsequently had 2 emergency department visits in December 1 on the 26 and a second 1 on the 28th both for elevated blood pressure and stomach discomfort.  A CT scan showed duodenitis and gastritis so he was given Augmentin  as well as PPI and Carafate .  Today he feels significantly improved.  He is requesting refill of his Hyzaar.  He has had multiple squamous cell carcinomas removed.  He appears to have another one his left ear.  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   Past Medical/Surgical History: Past Medical History:  Diagnosis Date   Arthritis    Cancer (HCC)    squamous cell all in my face arms and hands   Chronic pain    UDS+ on 06/09/22 for opiates and amphetamines; pt not currently prescribed any   COPD (chronic obstructive pulmonary disease) (HCC)    Depression    Hypertension    admitted 06/09/22-06/11/22 for hypertensive urgency; ran out of BP meds   Renal infarct (HCC) 06/10/2022   Skin cancer     Past Surgical History:  Procedure Laterality Date   cartilage removed from both knees     EXCISION MASS HEAD Right 08/28/2022   Procedure: EXCISION OF EAR WOUND;  Surgeon: Luciano Standing, MD;  Location: [facility] OR;  Service: ENT;  Laterality: Right;   EXCISION MASS HEAD N/A 10/03/2022   Procedure: EXCISION WOUNDHEAD LESS THAN 100 CM^2 FOR FLAP;  Surgeon: Luciano Standing, MD;  Location: [facility] OR;  Service: ENT;  Laterality: N/A;   KNEE ARTHROSCOPY     left wrist     SKIN CANCER EXCISION     SKIN FULL THICKNESS GRAFT Right 08/28/2022   Procedure: RIGHT EXCISION OF EAR WOUND, ADJACENT TISSUE TRANSFER, RIGHT SKIN GRAFT FROM NECK TO EAR;  Surgeon: Luciano Standing, MD;  Location: [facility] OR;  Service: ENT;  Laterality: Right;   SKIN  FULL THICKNESS GRAFT N/A 10/03/2022   Procedure: ADJACENT TISSUE TRANSFER LEFT CHEEK AND RIGHT EAR, FULL THICKNESS SKIN GRAFT LEFT NECK TO LEFT LOWER EYELID AND RIGHT EAR;  Surgeon: Luciano Standing, MD;  Location: [facility] OR;  Service: ENT;  Laterality: N/A;    Social History:  reports that he has been smoking cigarettes. He has a 15 pack-year smoking history. He has been exposed to tobacco smoke. He has never used smokeless tobacco. He reports that he does not drink alcohol and does not use drugs.  Allergies: No Known Allergies  Family History:  Family History  Problem Relation Age of Onset   Cancer Father        Skin   Diabetes Father    Hypertension Father      Current Outpatient Medications:    acetaminophen  (TYLENOL ) 500 MG tablet, Take 1 tablet (500 mg total) by mouth every 6 (six) hours as needed., Disp: 30 tablet, Rfl: 0   albuterol  (VENTOLIN  HFA) 108 (90 Base) MCG/ACT inhaler, Inhale 1-2 puffs into the lungs every 4 (four) hours as needed for wheezing or shortness of breath., Disp: 1 each, Rfl: 0   amoxicillin -clavulanate (AUGMENTIN ) 875-125 MG tablet, Take 1 tablet by mouth every 12 (twelve) hours., Disp: 14 tablet, Rfl: 0   aspirin  81 MG chewable tablet, Chew 1  tablet (81 mg total) by mouth daily., Disp: , Rfl:    diltiazem  (CARDIZEM  CD) 120 MG 24 hr capsule, Take 1 capsule (120 mg total) by mouth daily., Disp: 30 capsule, Rfl: 11   diphenhydrAMINE  (BENADRYL ) 25 MG tablet, Take 25 mg by mouth daily as needed for allergies or itching., Disp: , Rfl:    Multiple Vitamins-Minerals (MULTIVITAMIN WITH MINERALS) tablet, Take 1 tablet by mouth daily., Disp: , Rfl:    pantoprazole  (PROTONIX ) 20 MG tablet, Take 1 tablet (20 mg total) by mouth daily for 14 days., Disp: 14 tablet, Rfl: 0   predniSONE  (DELTASONE ) 10 MG tablet, 40 mg x 3 days, 20 mg x 3 days, 10 mg x 3 days, Disp: 21 tablet, Rfl: 0   predniSONE  (DELTASONE ) 10 MG tablet, Take daily as directed according to taper., Disp: 15 tablet,  Rfl: 0   sucralfate  (CARAFATE ) 1 g tablet, Take 1 tablet (1 g total) by mouth with breakfast, with lunch, and with evening meal for 7 days., Disp: 21 tablet, Rfl: 0   tamsulosin  (FLOMAX ) 0.4 MG CAPS capsule, Take 1 capsule by mouth once daily, Disp: 30 capsule, Rfl: 0   benzonatate  (TESSALON ) 100 MG capsule, Take 1 capsule (100 mg total) by mouth 2 (two) times daily as needed for cough. (Patient not taking: Reported on 06/05/2023), Disp: 20 capsule, Rfl: 0   HYDROcodone  bit-homatropine (HYCODAN) 5-1.5 MG/5ML syrup, Take 5 mLs by mouth every 6 (six) hours as needed for cough. (Patient not taking: Reported on 06/05/2023), Disp: 120 mL, Rfl: 0   losartan -hydrochlorothiazide  (HYZAAR) 100-12.5 MG tablet, Take 1 tablet by mouth daily., Disp: 90 tablet, Rfl: 1   potassium chloride  SA (KLOR-CON  M) 20 MEQ tablet, Take 1 tablet (20 mEq total) by mouth daily for 3 days., Disp: 3 tablet, Rfl: 0   Varenicline  Tartrate, Starter, (CHANTIX  STARTING MONTH PAK) 0.5 MG X 11 & 1 MG X 42 TBPK, Take as directed on package (Patient not taking: Reported on 06/05/2023), Disp: 1 each, Rfl: 0  Review of Systems:  Negative unless indicated in HPI.   Physical Exam: Vitals:   06/05/23 1135  BP: 120/72  Pulse: 93  Temp: 97.7 F (36.5 C)  TempSrc: Oral  SpO2: 96%  Weight: 187 lb 11.2 oz (85.1 kg)  Height: 5' 10 (1.778 m)    Body mass index is 26.93 kg/m.   Physical Exam Vitals reviewed.  Constitutional:      Appearance: Normal appearance.  HENT:     Head: Normocephalic and atraumatic.     Ears:     Comments: Ulcerated, erythematous and edematous lesion of his left ear. Eyes:     Conjunctiva/sclera: Conjunctivae normal.  Cardiovascular:     Rate and Rhythm: Normal rate and regular rhythm.  Pulmonary:     Effort: Pulmonary effort is normal.     Breath sounds: Normal breath sounds.  Skin:    General: Skin is warm and dry.  Neurological:     General: No focal deficit present.     Mental Status: He is alert  and oriented to person, place, and time.  Psychiatric:        Mood and Affect: Mood normal.        Behavior: Behavior normal.        Thought Content: Thought content normal.        Judgment: Judgment normal.      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  Dispense: 90 tablet; Refill: 1  -Hospital charts reviewed in detail from both hospital stay in November and 2 emergency department visits in December.  He has completed treatment and is feeling improved.  He is taking medications as prescribed. -Losartan /hydrochlorothiazide  will be refilled. -He will call his dermatologist as he appears to have another squamous cell carcinoma of his left ear.   Time spent:31 minutes reviewing chart, interviewing and examining patient and formulating plan of care.     Tully Theophilus Andrews, MD [facility]

## 2023-07-01 ENCOUNTER — Other Ambulatory Visit: Payer: Self-pay | Admitting: Family

## 2023-07-01 DIAGNOSIS — R351 Nocturia: Secondary | ICD-10-CM

## 2023-07-21 ENCOUNTER — Other Ambulatory Visit: Payer: Self-pay | Admitting: *Deleted

## 2023-07-21 DIAGNOSIS — R351 Nocturia: Secondary | ICD-10-CM

## 2023-07-21 MED ORDER — TAMSULOSIN HCL 0.4 MG PO CAPS: 0.4000 mg | ORAL_CAPSULE | Freq: Every day | ORAL | 0 refills | Status: DC

## 2023-07-21 NOTE — Telephone Encounter (Signed)
 Refill sent.

## 2023-08-19 ENCOUNTER — Ambulatory Visit (INDEPENDENT_AMBULATORY_CARE_PROVIDER_SITE_OTHER): Payer: Medicare HMO | Admitting: Family Medicine

## 2023-08-19 DIAGNOSIS — Z0000 Encounter for general adult medical examination without abnormal findings: Secondary | ICD-10-CM | POA: Diagnosis not present

## 2023-08-19 NOTE — Progress Notes (Signed)
 PATIENT CHECK-IN and HEALTH RISK ASSESSMENT QUESTIONNAIRE:  -completed by phone/video for upcoming Medicare Preventive Visit  Pre-Visit Check-in: 1)Vitals (height, wt, BP, etc) - record in vitals section for visit on day of visit Request home vitals (wt, BP, etc.) and enter into vitals, THEN update Vital Signs SmartPhrase below at the top of the HPI. See below.  2)Review and Update Medications, Allergies PMH, Surgeries, Social history in Epic 3)Hospitalizations in the last year with date/reason? Last year for COPD and BP issues, reports is fine now.   4)Review and Update Care Team (patient's specialists) in Epic 5) Complete PHQ9 in Epic  6) Complete Fall Screening in Epic 7)Review all Health Maintenance Due and order under PCP if not done.  Medicare Wellness Patient Questionnaire:  Answer theses question about your habits: How often do you have a drink containing alcohol?no Have you ever smoked? Yes How many packs a day do/did you smoke? 1 ppd Do you use an illicit drugs?n On average, how many days per week do you engage in moderate to strenuous exercise (like a brisk walk)? Walks about 7 days per week for at least 1 hour per day, he does not spend any time sitting Typical diet: trying to stay away from sugar, but admits does not eat great, feels like does get enough protein, and tries to eat plenty of veggies  Beverages: water  Answer theses question about your everyday activities: Can you perform most household chores? y Are you deaf or have significant trouble hearing?n Do you feel that you have a problem with memory?n Do you feel safe at home?y Last dentist visit? No teeth 8. Do you have any difficulty performing your everyday activities?n Are you having any difficulty walking, taking medications on your own, and or difficulty managing daily home needs?n Do you have difficulty walking or climbing stairs?n Do you have difficulty dressing or bathing?n Do you have difficulty doing  errands alone such as visiting a doctor's office or shopping?n Do you currently have any difficulty preparing food and eating?n Do you currently have any difficulty using the toilet?n Do you have any difficulty managing your finances?n Do you have any difficulties with housekeeping of managing your housekeeping?n   Do you have Advanced Directives in place (Living Will, Healthcare Power or Attorney)?  no   Last eye Exam and location? Has not had an eye exam in some time, reports vision is fine.    Do you currently use prescribed or non-prescribed narcotic or opioid pain medications?n     ----------------------------------------------------------------------------------------------------------------------------------------------------------------------------------------------------------------------  Because this visit was a virtual/telehealth visit, some criteria may be missing or patient reported. Any vitals not documented were not able to be obtained and vitals that have been documented are patient reported.    MEDICARE ANNUAL PREVENTIVE CARE VISIT WITH PROVIDER (Welcome to Medicare, initial annual wellness or annual wellness exam)  Virtual Visit via Phone Note  I connected with Don [name] on 08/19/23  by phone and verified that I am speaking with the correct person using two identifiers.Patient declines visdeo visit and prefers to talk by phone.  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  Reports saw his dermatologist recently and they removed some BCCs.   See HM section in Epic for other  details of completed HM.    ROS: negative for report of  fevers, unintentional weight loss, vision changes, vision loss, hearing loss or change, chest pain, sob, hemoptysis, melena, hematochezia, hematuria, falls, bleeding or bruising, thoughts of suicide or self harm, memory loss  Patient-completed extensive health risk assessment - reviewed and discussed with the patient: See Health Risk Assessment completed with patient prior to the visit either above or in recent phone note. This was reviewed in detailed with the patient today and appropriate recommendations, orders and referrals were placed as needed per Summary below and patient instructions.   Review of Medical History: -PMH, PSH, Family History and current specialty and care providers reviewed and updated and listed below   Patient Care Team: Alveria Apley, NP as PCP - General (Family Medicine) Malmfelt, Lise Auer, RN as Oncology Nurse Navigator Lonie Peak, MD as Consulting Physician (Radiation Oncology) Scarlette Ar, MD as Consulting Physician (Otolaryngology) Glennis Brink, MD as Referring Physician (Dermatology) Penelope Galas, MD as Referring Physician (Pathology) Scarlette Ar, MD as Consulting Physician (Otolaryngology)   Past Medical History:  Diagnosis Date   Arthritis    Cancer [facility])    squamous cell "all in my face arms and hands"   Chronic pain    UDS+ on 06/09/22 for opiates and amphetamines; pt not currently prescribed any   COPD (chronic obstructive pulmonary disease) (HCC)    Depression    Hypertension    admitted 06/09/22-06/11/22 for hypertensive urgency; ran out of BP meds   Renal infarct (HCC) 06/10/2022   Skin cancer     Past Surgical History:  Procedure Laterality Date   cartilage removed from both knees     EXCISION MASS HEAD Right 08/28/2022   Procedure: EXCISION OF EAR WOUND;  Surgeon: Scarlette Ar, MD;  Location[facility] OR;  Service: ENT;  Laterality: Right;   EXCISION MASS HEAD N/A 10/03/2022   Procedure: EXCISION WOUNDHEAD LESS THAN 100 CM^2  FOR FLAP;  Surgeon: Scarlette Ar, MD;  Location: [facility] OR;  Service: ENT;  Laterality: N/A;   KNEE ARTHROSCOPY     left wrist     SKIN CANCER EXCISION     SKIN FULL THICKNESS GRAFT Right 08/28/2022   Procedure: RIGHT EXCISION OF EAR WOUND, ADJACENT TISSUE TRANSFER, RIGHT SKIN GRAFT FROM NECK TO EAR;  Surgeon: Scarlette Ar, MD;  Location: [facility] OR;  Service: ENT;  Laterality: Right;   SKIN FULL THICKNESS GRAFT N/A 10/03/2022   Procedure: ADJACENT TISSUE TRANSFER LEFT CHEEK AND RIGHT EAR, FULL THICKNESS SKIN GRAFT LEFT NECK TO LEFT LOWER EYELID AND RIGHT EAR;  Surgeon: Scarlette Ar, MD;  Location: [facility] OR;  Service: ENT;  Laterality: N/A;    Social History   Socioeconomic History   Marital status: Significant Other    Spouse name: Not on file   Number of children: 2   Years of education: Not on file   Highest education level: Some college, no degree  Occupational History   Occupation: Retired  Tobacco Use   Smoking status: Every Day    Current packs/day: 0.50    Average packs/day: 0.5 packs/day for 30.0 years (15.0 ttl pk-yrs)    Types: Cigarettes    Passive exposure: Current   Smokeless tobacco: Never  Vaping Use   Vaping status: Never Used  Substance and Sexual Activity   Alcohol use: No   Drug use: No   Sexual activity: Yes  Other Topics Concern   Not on file  Social History Narrative   Patient  lives girlfriend and another gentleman. No pets.    Social Drivers of Health   Financial Resource Strain: Medium Risk (04/22/2023)   Overall Financial Resource Strain (CARDIA)    Difficulty of Paying Living Expenses: Somewhat hard  Food Insecurity: Food Insecurity Present (04/30/2023)   Hunger Vital Sign    Worried About Running Out of Food in the Last Year: Sometimes true    Ran Out of Food in the Last Year: Never true  Transportation Needs: No Transportation Needs (04/30/2023)   PRAPARE - Administrator, Civil Service (Medical): No    Lack of Transportation  (Non-Medical): No  Physical Activity: Inactive (09/26/2022)   Exercise Vital Sign    Days of Exercise per Week: 0 days    Minutes of Exercise per Session: 0 min  Stress: Stress Concern Present (04/22/2023)   [employer] of Occupational Health - Occupational Stress Questionnaire    Feeling of Stress : To some extent  Social Connections: Moderately Integrated (09/26/2022)   Social Connection and Isolation Panel [NHANES]    Frequency of Communication with Friends and Family: Twice a week    Frequency of Social Gatherings with Friends and Family: Twice a week    Attends Religious Services: More than 4 times per year    Active Member of [employer] or Organizations: No    Attends Banker Meetings: Never    Marital Status: Living with partner  Intimate Partner Violence: Not At Risk (04/30/2023)   Humiliation, Afraid, Rape, and Kick questionnaire    Fear of Current or Ex-Partner: No    Emotionally Abused: No    Physically Abused: No    Sexually Abused: No    Family History  Problem Relation Age of Onset   Cancer Father        Skin   Diabetes Father    Hypertension Father     Current Outpatient Medications on File Prior to Visit  Medication Sig Dispense Refill   acetaminophen (TYLENOL) 500 MG tablet Take 1 tablet (500 mg total) by mouth every 6 (six) hours as needed. 30 tablet 0   albuterol (VENTOLIN HFA) 108 (90 Base) MCG/ACT inhaler Inhale 1-2 puffs into the lungs every 4 (four) hours as needed for wheezing or shortness of breath. 1 each 0   amoxicillin-clavulanate (AUGMENTIN) 875-125 MG tablet Take 1 tablet by mouth every 12 (twelve) hours. 14 tablet 0   aspirin 81 MG chewable tablet Chew 1 tablet (81 mg total) by mouth daily.     benzonatate (TESSALON) 100 MG capsule Take 1 capsule (100 mg total) by mouth 2 (two) times daily as needed for cough. (Patient not taking: Reported on 06/05/2023) 20 capsule 0   diltiazem (CARDIZEM CD) 120 MG 24 hr capsule Take 1 capsule (120 mg  total) by mouth daily. 30 capsule 11   diphenhydrAMINE (BENADRYL) 25 MG tablet Take 25 mg by mouth daily as needed for allergies or itching.     HYDROcodone bit-homatropine (HYCODAN) 5-1.5 MG/5ML syrup Take 5 mLs by mouth every 6 (six) hours as needed for cough. (Patient not taking: Reported on 06/05/2023) 120 mL 0   losartan-hydrochlorothiazide (HYZAAR) 100-12.5 MG tablet Take 1 tablet by mouth daily. 90 tablet 1   Multiple Vitamins-Minerals (MULTIVITAMIN WITH MINERALS) tablet Take 1 tablet by mouth daily.     pantoprazole (PROTONIX) 20 MG tablet Take 1 tablet (20 mg total) by mouth daily for 14 days. 14 tablet 0   potassium chloride SA (KLOR-CON M) 20 MEQ tablet Take  1 tablet (20 mEq total) by mouth daily for 3 days. 3 tablet 0   predniSONE (DELTASONE) 10 MG tablet 40 mg x 3 days, 20 mg x 3 days, 10 mg x 3 days 21 tablet 0   predniSONE (DELTASONE) 10 MG tablet Take daily as directed according to taper. 15 tablet 0   sucralfate (CARAFATE) 1 g tablet Take 1 tablet (1 g total) by mouth with breakfast, with lunch, and with evening meal for 7 days. 21 tablet 0   tamsulosin (FLOMAX) 0.4 MG CAPS capsule Take 1 capsule (0.4 mg total) by mouth daily. 30 capsule 0   Varenicline Tartrate, Starter, (CHANTIX STARTING MONTH PAK) 0.5 MG X 11 & 1 MG X 42 TBPK Take as directed on package (Patient not taking: Reported on 06/05/2023) 1 each 0   No current facility-administered medications on file prior to visit.    No Known Allergies     Physical Exam Vitals requested from patient and listed below if patient had equipment and was able to obtain at home for this virtual visit: There were no vitals filed for this visit. Estimated body mass index is 26.93 kg/m as calculated from the following:   Height as of 06/05/23: 5\' 10"  (1.778 m).   Weight as of 06/05/23: 187 lb 11.2 oz (85.1 kg).  EKG (optional): deferred due to virtual visit  GENERAL: alert, oriented, no acute distress detected; full vision exam deferred  due to pandemic and/or virtual encounter  PSYCH/NEURO: pleasant and cooperative, no obvious depression or anxiety, speech and thought processing grossly intact, Cognitive function grossly intact  Flowsheet Row Clinical Support from 08/19/2023 in [facility] [place]  PHQ-9 Total Score 2           08/19/2023   10:46 AM 03/14/2023   11:00 AM 09/26/2022    1:42 PM 08/09/2022   12:41 PM 10/03/2021   10:44 AM  Depression screen PHQ 2/9  Decreased Interest 1 1 0 0 0  Down, Depressed, Hopeless 1 1 2  0 1  PHQ - 2 Score 2 2 2  0 1  Altered sleeping 0 1 0 0 0  Tired, decreased energy 0 1 0 0 0  Change in appetite 0 1 0 0 0  Feeling bad or failure about yourself  0 1 0 0 0  Trouble concentrating 0 1 0 0 0  Moving slowly or fidgety/restless  1 0 0 0  Suicidal thoughts 0 1 0 0 0  PHQ-9 Score 2 9 2  0 1  Difficult doing work/chores Not difficult at all  Somewhat difficult Not difficult at all Not difficult at all       08/09/2022   12:41 PM 09/26/2022    1:36 PM 09/26/2022    1:39 PM 03/14/2023   11:00 AM 08/19/2023   10:46 AM  Fall Risk  Falls in the past year? 0 1 0 0 0  Was there an injury with Fall? 0 0 0 0 0  Fall Risk Category Calculator 0 1 0 0 0  Patient at Risk for Falls Due to History of fall(s)   No Fall Risks   Fall risk Follow up Falls evaluation completed Falls evaluation completed;Education provided;Falls prevention discussed Falls evaluation completed;Education provided;Falls prevention discussed Falls evaluation completed Falls evaluation completed;Education provided     SUMMARY AND PLAN:  Encounter for Medicare annual wellness exam   Discussed applicable health maintenance/preventive health measures and advised and referred or ordered per patient preferences: -he declines all cancer screenings  today, says he prefers to finish the skin surgery he is undergoing first and then talk with [place]. Advised he call to schedule visit with Mardene Celeste and let us  know in the meantime if changes his mind.  -for vaccines due he also says he plans to wait but is considering the pneumonia and tetanus boosters. Discussed recs, risks and how he can get at the pharmacy if decides to do. Advised to bring record of receipt to Mardene Celeste if does do. Health Maintenance  Topic Date Due   INFLUENZA VACCINE  09/01/2023 (Originally 01/02/2023)   Zoster Vaccines- Shingrix (1 of 2) 11/19/2023 (Originally [birth year]/08/1972)   Lung Cancer Screening  08/18/2024 (Originally 06/10/2023)   DTaP/Tdap/Td (2 - Td or Tdap) 08/18/2024 (Originally 05/12/2023)   Pneumonia Vaccine 87+ Years old (1 of 2 - PCV) 08/18/2024 (Originally [birth year]09[birth year]1959)   Colonoscopy  08/18/2024 (Originally 12/09/1998)   Hepatitis C Screening  08/18/2024 (Originally 12/09/1971)   Medicare Annual Wellness (AWV)  08/18/2024   HPV VACCINES  Aged Out   COVID-19 Vaccine  Discontinued     Education and counseling on the following was provided based on the above review of health and a plan/checklist for the patient, along with additional information discussed, was provided for the patient in the patient instructions :  -Advised on importance of completing advanced directives, discussed options for completing and provided information in patient instructions as well -offered  referral for depressed mood but he feels is ok and declined, did counseling in the past -Advised and counseled on a healthy lifestyle - including the importance of a healthy diet, regular physical activity, social connections  -Reviewed patient's current diet. Advised and counseled on a whole foods based healthy diet. A summary of a healthy diet was provided in the Patient Instructions.  -reviewed patient's current physical activity level and discussed exercise guidelines for adults. Discussed community resources and ideas for safe exercise at home to assist in meeting exercise guideline recommendations in a safe and healthy way.  -Advise yearly dental visits at  minimum and regular eye exams -Advised and counseled on tobacco use, risks of smoking and offered counseling/help, he agrees to call quit line - he is not sure about quitting right now -offered referral for the knee issues, but he says wants to hold off for now and reports has seen Del[facility] Harness and they are aware of the knee issues.   Follow up: see patient instructions   Patient Instructions  I really enjoyed getting to talk with you today! I am available on Tuesdays and Thursdays for virtual visits if you have any questions or concerns, or if I can be of any further assistance.   CHECKLIST FROM ANNUAL WELLNESS VISIT:  -Follow up (please call to schedule if not scheduled after visit):   -yearly for annual wellness visit with primary care office  Here is a list of your preventive care/health maintenance measures and the plan for each if any are due:  PLAN For any measures below that may be due:  -you are do for lung and colon and possibly prostate cancer screening. Please let us know if you change your mind and wish to do any of these. -you are due for several vaccines listed below, if you get any of theses at the pharmacy please let us know so that we can update your record.   Health Maintenance  Topic Date Due   INFLUENZA VACCINE  09/01/2023 (Originally 01/02/2023)   Zoster Vaccines- Shingrix (1 of 2) 11/19/2023 (Originally [birth year]/1972)  Lung Cancer Screening  08/18/2024 (Originally 06/10/2023)   DTaP/Tdap/Td (2 - Td or Tdap) 08/18/2024 (Originally 05/12/2023)   Pneumonia Vaccine 76+ Years old (1 of 2 - PCV) 08/18/2024 (Originally [birth year]1959)   Colonoscopy  08/18/2024 (Originally 12/09/1998)   Hepatitis C Screening  08/18/2024 (Originally [birth year]09/1971)   Medicare Annual Wellness (AWV)  08/18/2024   HPV VACCINES  Aged Out   COVID-19 Vaccine  Discontinued    -See a dentist at least yearly  -Get your eyes checked and then per your eye specialist's recommendations  -Other issues addressed  today:   -Please quit smoking, Call 1-800-QUITNOW and let us know if we can help.  -I have included below further information regarding a healthy whole foods based diet, physical activity guidelines for adults, stress management and opportunities for social connections. I hope you find this information useful.   -----------------------------------------------------------------------------------------------------------------------------------------------------------------------------------------------------------------------------------------------------------    NUTRITION: -eat real food: lots of colorful vegetables (half the plate) and fruits -5-7 servings of vegetables and fruits per day (fresh or steamed is best), exp. 2 servings of vegetables with lunch and dinner and 2 servings of fruit per day. Berries and greens such as kale and collards are great choices.  -consume on a regular basis:  fresh fruits, fresh veggies, fish, nuts, seeds, healthy oils (such as olive oil, avocado oil), whole grains (make sure for bread/pasta/crackers/etc., that the first ingredient on label contains the word "whole"), legumes. -can eat small amounts of dairy and lean meat (no larger than the palm of your hand), but avoid processed meats such as ham, bacon, lunch meat, etc. -drink water -try to avoid fast food and pre-packaged foods, processed meat, ultra processed foods/beverages (donuts, candy, etc.) -most experts advise limiting sodium to < 2300mg  per day, should limit further is any chronic conditions such as high blood pressure, heart disease, diabetes, etc. The American Heart Association advised that < 1500mg  is is ideal -try to avoid foods/beverages that contain any ingredients with names you do not recognize  -try to avoid foods/beverages  with added sugar or sweeteners/sweets  -try to avoid sweet drinks (including diet drinks): soda, juice, Gatorade, sweet tea, power drinks, diet drinks -try to avoid  white rice, white bread, pasta (unless whole grain)  EXERCISE GUIDELINES FOR ADULTS: -if you wish to increase your physical activity, do so gradually and with the approval of your doctor -STOP and seek medical care immediately if you have any chest pain, chest discomfort or trouble breathing when starting or increasing exercise  -move and stretch your body, legs, feet and arms when sitting for long periods -Physical activity guidelines for optimal health in adults: -get at least 150 minutes per week of moderate exercise (can talk, but not sing); this is about 20-30 minutes of sustained activity 5-7 days per week or two 10-15 minute episodes of sustained activity 5-7 days per week -do some muscle building/resistance training/strength training at least 2 days per week  -balance exercises 3+ days per week:   Stand somewhere where you have something sturdy to hold onto if you lose balance    1) lift up on toes, then back down, start with 5x per day and work up to 20x   2) stand and lift one leg straight out to the side so that foot is a few inches of the floor, start with 5x each side and work up to 20x each side   3) stand on one foot, start with 5 seconds each side and work up to 20 seconds on each  side  If you need ideas or help with getting more active:  -Silver sneakers https://tools.silversneakers.com  -Walk with a Doc: http://www.duncan-williams.com/  -try to include resistance (weight lifting/strength building) and balance exercises twice per week: or the following link for ideas: http://castillo-powell.com/  BuyDucts.dk  STRESS MANAGEMENT: -can try meditating, or just sitting quietly with deep breathing while intentionally relaxing all parts of your body for 5 minutes daily -if you need further help with stress, anxiety or depression please follow up with your primary doctor or contact the wonderful  folks at [facility]: [phone]  SOCIAL CONNECTIONS: -options in [place] if you wish to engage in more social and exercise related activities:  -Silver sneakers https://tools.silversneakers.com  -Walk with a Doc: http://www.duncan-williams.com/  -Check out the [facility] Active Adults 50+ section on the [place] [employer] (hiking clubs, book clubs, cards and games, chess, exercise classes, aquatic classes and much more) - see the website for details: https://www.[place].gov/departments/parks-recreation/active-adults50  -YouTube has lots of exercise videos for different ages and abilities as well  -[facility] (a variety of indoor and outdoor inperson activities for adults). [phone]. [address].  -Virtual Online Classes (a variety of topics): see seniorplanet.org or call [phone]  -consider volunteering at a school, hospice center, church, senior center or elsewhere    ADVANCED HEALTHCARE DIRECTIVES:  [place] Advanced Directives assistance:   ExpressWeek.com.cy  Everyone should have advanced health care directives in place. This is so that you get the care you want, should you ever be in a situation where you are unable to make your own medical decisions.   From the [place] Advanced Directive Website: "Advance Health Care Directives are legal documents in which you give written instructions about your health care if, in the future, you cannot speak for yourself.   A health care power of attorney allows you to name a person you trust to make your health care decisions if you cannot make them yourself. A declaration of a desire for a natural death (or living will) is document, which states that you desire not to have your life prolonged by extraordinary measures if you have a terminal or incurable illness or if you are in a vegetative state. An advance instruction for mental  health treatment makes a declaration of instructions, information and preferences regarding your mental health treatment. It also states that you are aware that the advance instruction authorizes a mental health treatment provider to act according to your wishes. It may also outline your consent or refusal of mental health treatment. A declaration of an anatomical gift allows anyone over the age of 30 to make a gift by will, organ donor card or other document."   Please see the following website or an elder law attorney for forms, FAQs and for completion of advanced directives: [place] TEFL teacher Health Care Directives Advance Health Care Directives (http://guzman.com/)  Or copy and paste the following to your web browser: PoshChat.fi          Terressa Koyanagi, DO

## 2023-08-19 NOTE — Patient Instructions (Addendum)
 I really enjoyed getting to talk with you today! I am available on Tuesdays and Thursdays for virtual visits if you have any questions or concerns, or if I can be of any further assistance.   CHECKLIST FROM ANNUAL WELLNESS VISIT:  -Follow up (please call to schedule if not scheduled after visit):   -yearly for annual wellness visit with primary care office  Here is a list of your preventive care/health maintenance measures and the plan for each if any are due:  PLAN For any measures below that may be due:  -you are do for lung and colon and possibly prostate cancer screening. Please let us know if you change your mind and wish to do any of these. -you are due for several vaccines listed below, if you get any of theses at the pharmacy please let us know so that we can update your record.   Health Maintenance  Topic Date Due   INFLUENZA VACCINE  09/01/2023 (Originally 01/02/2023)   Zoster Vaccines- Shingrix (1 of 2) 11/19/2023 (Originally [birth year]/1972)   Lung Cancer Screening  08/18/2024 (Originally 06/10/2023)   DTaP/Tdap/Td (2 - Td or Tdap) 08/18/2024 (Originally 05/12/2023)   Pneumonia Vaccine 70+ Years old (1 of 2 - PCV) 08/18/2024 (Originally 12/09/1961)   Colonoscopy  08/18/2024 (Originally 12/09/1998)   Hepatitis C Screening  08/18/2024 (Originally 12/09/1971)   Medicare Annual Wellness (AWV)  08/18/2024   HPV VACCINES  Aged Out   COVID-19 Vaccine  Discontinued    -See a dentist at least yearly  -Get your eyes checked and then per your eye specialist's recommendations  -Other issues addressed today:   -Please quit smoking, Call 1-800-QUITNOW and let us know if we can help.  -I have included below further information regarding a healthy whole foods based diet, physical activity guidelines for adults, stress management and opportunities for social connections. I hope you find this information useful.    -----------------------------------------------------------------------------------------------------------------------------------------------------------------------------------------------------------------------------------------------------------    NUTRITION: -eat real food: lots of colorful vegetables (half the plate) and fruits -5-7 servings of vegetables and fruits per day (fresh or steamed is best), exp. 2 servings of vegetables with lunch and dinner and 2 servings of fruit per day. Berries and greens such as kale and collards are great choices.  -consume on a regular basis:  fresh fruits, fresh veggies, fish, nuts, seeds, healthy oils (such as olive oil, avocado oil), whole grains (make sure for bread/pasta/crackers/etc., that the first ingredient on label contains the word "whole"), legumes. -can eat small amounts of dairy and lean meat (no larger than the palm of your hand), but avoid processed meats such as ham, bacon, lunch meat, etc. -drink water -try to avoid fast food and pre-packaged foods, processed meat, ultra processed foods/beverages (donuts, candy, etc.) -most experts advise limiting sodium to < 2300mg  per day, should limit further is any chronic conditions such as high blood pressure, heart disease, diabetes, etc. The American Heart Association advised that < 1500mg  is is ideal -try to avoid foods/beverages that contain any ingredients with names you do not recognize  -try to avoid foods/beverages  with added sugar or sweeteners/sweets  -try to avoid sweet drinks (including diet drinks): soda, juice, Gatorade, sweet tea, power drinks, diet drinks -try to avoid white rice, white bread, pasta (unless whole grain)  EXERCISE GUIDELINES FOR ADULTS: -if you wish to increase your physical activity, do so gradually and with the approval of your doctor -STOP and seek medical care immediately if you have any chest pain, chest discomfort or trouble  breathing when starting or  increasing exercise  -move and stretch your body, legs, feet and arms when sitting for long periods -Physical activity guidelines for optimal health in adults: -get at least 150 minutes per week of moderate exercise (can talk, but not sing); this is about 20-30 minutes of sustained activity 5-7 days per week or two 10-15 minute episodes of sustained activity 5-7 days per week -do some muscle building/resistance training/strength training at least 2 days per week  -balance exercises 3+ days per week:   Stand somewhere where you have something sturdy to hold onto if you lose balance    1) lift up on toes, then back down, start with 5x per day and work up to 20x   2) stand and lift one leg straight out to the side so that foot is a few inches of the floor, start with 5x each side and work up to 20x each side   3) stand on one foot, start with 5 seconds each side and work up to 20 seconds on each side  If you need ideas or help with getting more active:  -Silver sneakers https://tools.silversneakers.com  -Walk with a Doc: http://www.duncan-[email or web address]/  -try to include resistance (weight lifting/strength building) and balance exercises twice per week: or the following link for ideas: http://castillo-powell.com/  BuyDucts.dk  STRESS MANAGEMENT: -can try meditating, or just sitting quietly with deep breathing while intentionally relaxing all parts of your body for 5 minutes daily -if you need further help with stress, anxiety or depression please follow up with your primary doctor or contact the wonderful folks at [facility]: [phone]  SOCIAL CONNECTIONS: -options in [place] if you wish to engage in more social and exercise related activities:  -Silver sneakers https://tools.silversneakers.com  -Walk with a Doc: http://www.duncan-williams.com/  -Check out the [facility] Active Adults 50+  section on the [place] of [employer] (hiking clubs, book clubs, cards and games, chess, exercise classes, aquatic classes and much more) - see the website for details: https://www.[place].gov/departments/parks-recreation/active-adults50  -YouTube has lots of exercise videos for different ages and abilities as well  -[facility] (a variety of indoor and outdoor inperson activities for adults). [phone]. [address]  -Virtual Online Classes (a variety of topics): see seniorplanet.org or call [phone]  -consider volunteering at a school, hospice center, church, senior center or elsewhere    ADVANCED HEALTHCARE DIRECTIVES:  [place] Advanced Directives assistance:   ExpressWeek.com.cy  Everyone should have advanced health care directives in place. This is so that you get the care you want, should you ever be in a situation where you are unable to make your own medical decisions.   From the [place] Advanced Directive Website: "Advance Health Care Directives are legal documents in which you give written instructions about your health care if, in the future, you cannot speak for yourself.   A health care power of attorney allows you to name a person you trust to make your health care decisions if you cannot make them yourself. A declaration of a desire for a natural death (or living will) is document, which states that you desire not to have your life prolonged by extraordinary measures if you have a terminal or incurable illness or if you are in a vegetative state. An advance instruction for mental health treatment makes a declaration of instructions, information and preferences regarding your mental health treatment. It also states that you are aware that the advance instruction authorizes a mental health treatment provider to act according  to your wishes. It may also outline your consent or refusal of  mental health treatment. A declaration of an anatomical gift allows anyone over the age of 19 to make a gift by will, organ donor card or other document."   Please see the following website or an elder law attorney for forms, FAQs and for completion of advanced directives: [place] Health Care Directives Advance Health Care Directives (http://guzman.com/)  Or copy and paste the following to your web browser: PoshChat.fi

## 2023-11-13 ENCOUNTER — Other Ambulatory Visit: Payer: Self-pay | Admitting: Internal Medicine

## 2023-11-13 DIAGNOSIS — I10 Essential (primary) hypertension: Secondary | ICD-10-CM

## 2024-03-16 ENCOUNTER — Other Ambulatory Visit: Payer: Self-pay | Admitting: Family Medicine

## 2024-03-16 DIAGNOSIS — R351 Nocturia: Secondary | ICD-10-CM

## 2024-05-25 ENCOUNTER — Other Ambulatory Visit: Payer: Self-pay | Admitting: Family Medicine

## 2024-05-25 DIAGNOSIS — I10 Essential (primary) hypertension: Secondary | ICD-10-CM

## 2024-05-31 ENCOUNTER — Other Ambulatory Visit: Payer: Self-pay | Admitting: Family Medicine

## 2024-05-31 DIAGNOSIS — R351 Nocturia: Secondary | ICD-10-CM
